# Patient Record
Sex: Male | Born: 1970
Health system: Southern US, Community
[De-identification: ages and names within clinical notes are randomized; demographics above are authoritative.]

## PROBLEM LIST (undated history)

## (undated) DIAGNOSIS — G4730 Sleep apnea, unspecified: Secondary | ICD-10-CM

## (undated) DIAGNOSIS — K219 Gastro-esophageal reflux disease without esophagitis: Secondary | ICD-10-CM

## (undated) DIAGNOSIS — T7840XA Allergy, unspecified, initial encounter: Secondary | ICD-10-CM

## (undated) DIAGNOSIS — K635 Polyp of colon: Secondary | ICD-10-CM

## (undated) DIAGNOSIS — K5792 Diverticulitis of intestine, part unspecified, without perforation or abscess without bleeding: Secondary | ICD-10-CM

## (undated) DIAGNOSIS — J329 Chronic sinusitis, unspecified: Secondary | ICD-10-CM

## (undated) DIAGNOSIS — M1990 Unspecified osteoarthritis, unspecified site: Secondary | ICD-10-CM

## (undated) DIAGNOSIS — F32A Depression, unspecified: Secondary | ICD-10-CM

## (undated) DIAGNOSIS — J309 Allergic rhinitis, unspecified: Secondary | ICD-10-CM

## (undated) HISTORY — PX: TRIGGER FINGER RELEASE: SHX641

## (undated) HISTORY — PX: SUBACROMIAL DECOMPRESSION: SHX5174

## (undated) HISTORY — PX: VASECTOMY: SHX75

## (undated) HISTORY — DX: Depression, unspecified: F32.A

## (undated) HISTORY — PX: NASAL SINUS SURGERY: SHX719

## (undated) HISTORY — DX: Polyp of colon: K63.5

## (undated) HISTORY — DX: Diverticulitis of intestine, part unspecified, without perforation or abscess without bleeding: K57.92

## (undated) HISTORY — DX: Allergy, unspecified, initial encounter: T78.40XA

## (undated) HISTORY — PX: KNEE ARTHROSCOPY: SHX127

## (undated) HISTORY — DX: Unspecified osteoarthritis, unspecified site: M19.90

## (undated) HISTORY — PX: CARPAL TUNNEL RELEASE: SHX101

---
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
## 2001-04-02 DIAGNOSIS — A630 Anogenital (venereal) warts: Secondary | ICD-10-CM | POA: Insufficient documentation

## 2016-12-29 HISTORY — PX: TRIGGER FINGER RELEASE: SHX641

## 2017-09-28 ENCOUNTER — Encounter: Payer: Self-pay | Admitting: Internal Medicine

## 2017-09-28 LAB — HM COLONOSCOPY

## 2017-12-29 HISTORY — PX: VASECTOMY: SHX75

## 2019-08-04 LAB — COMPREHENSIVE METABOLIC PANEL
Albumin: 4.2 (ref 3.5–5.0)
Calcium: 9.6 (ref 8.7–10.7)
GFR calc Af Amer: 97
GFR calc non Af Amer: 83

## 2019-08-04 LAB — LIPID PANEL
Cholesterol: 190 (ref 0–200)
HDL: 44 (ref 35–70)
LDL Cholesterol: 134
Triglycerides: 58 (ref 40–160)

## 2019-08-04 LAB — CBC AND DIFFERENTIAL
HCT: 42 (ref 41–53)
Hemoglobin: 13.2 — AB (ref 13.5–17.5)
Neutrophils Absolute: 1
Platelets: 173 (ref 150–399)
WBC: 3.4

## 2019-08-04 LAB — HEPATIC FUNCTION PANEL
ALT: 21 (ref 10–40)
AST: 21 (ref 14–40)
Alkaline Phosphatase: 72 (ref 25–125)
Bilirubin, Total: 0.9

## 2019-08-04 LAB — PSA: PSA: 0.56

## 2019-08-04 LAB — BASIC METABOLIC PANEL
BUN: 16 (ref 4–21)
CO2: 24 — AB (ref 13–22)
Chloride: 104 (ref 99–108)
Creatinine: 1.1 (ref 0.6–1.3)
Glucose: 78
Potassium: 4.9 (ref 3.4–5.3)
Sodium: 139 (ref 137–147)

## 2019-08-04 LAB — CBC: RBC: 4.33 (ref 3.87–5.11)

## 2020-06-02 DIAGNOSIS — J0141 Acute recurrent pansinusitis: Secondary | ICD-10-CM | POA: Diagnosis not present

## 2020-06-13 ENCOUNTER — Telehealth: Payer: Self-pay | Admitting: Internal Medicine

## 2020-06-13 DIAGNOSIS — L237 Allergic contact dermatitis due to plants, except food: Secondary | ICD-10-CM | POA: Diagnosis not present

## 2020-06-13 NOTE — Telephone Encounter (Signed)
Caller: Scott Phillips Call back phone number: [phone]  Patient is wondering if you would accepting him as a new patient.

## 2020-06-13 NOTE — Telephone Encounter (Signed)
Okay to schedule NP appt.  

## 2020-06-13 NOTE — Telephone Encounter (Signed)
Yes, that is okay.

## 2020-06-13 NOTE — Telephone Encounter (Signed)
Please advise 

## 2020-06-13 NOTE — Telephone Encounter (Signed)
Left message for the patient to call back to schedule appointment. Advise the patient to call his insurance company to assure we are in network since he has [facility] out of network

## 2020-06-18 DIAGNOSIS — J310 Chronic rhinitis: Secondary | ICD-10-CM | POA: Diagnosis not present

## 2020-06-18 DIAGNOSIS — J320 Chronic maxillary sinusitis: Secondary | ICD-10-CM | POA: Diagnosis not present

## 2020-06-19 ENCOUNTER — Other Ambulatory Visit: Payer: Self-pay | Admitting: Otolaryngology

## 2020-06-21 ENCOUNTER — Encounter (HOSPITAL_BASED_OUTPATIENT_CLINIC_OR_DEPARTMENT_OTHER): Payer: Self-pay | Admitting: Otolaryngology

## 2020-06-26 ENCOUNTER — Other Ambulatory Visit (HOSPITAL_COMMUNITY): Payer: BC Managed Care – PPO

## 2020-06-26 DIAGNOSIS — K219 Gastro-esophageal reflux disease without esophagitis: Secondary | ICD-10-CM | POA: Insufficient documentation

## 2020-06-26 DIAGNOSIS — G4733 Obstructive sleep apnea (adult) (pediatric): Secondary | ICD-10-CM | POA: Insufficient documentation

## 2020-06-28 ENCOUNTER — Other Ambulatory Visit (HOSPITAL_COMMUNITY)
Admission: RE | Admit: 2020-06-28 | Discharge: 2020-06-28 | Disposition: A | Discharge status: Home / Self Care | Payer: BC Managed Care – PPO | Source: Ambulatory Visit | Attending: Otolaryngology | Admitting: Otolaryngology

## 2020-06-28 DIAGNOSIS — Z20822 Contact with and (suspected) exposure to covid-19: Secondary | ICD-10-CM | POA: Diagnosis not present

## 2020-06-28 DIAGNOSIS — Z01812 Encounter for preprocedural laboratory examination: Secondary | ICD-10-CM | POA: Insufficient documentation

## 2020-06-28 LAB — SARS CORONAVIRUS 2 (TAT 6-24 HRS): SARS Coronavirus 2: NEGATIVE

## 2020-06-29 ENCOUNTER — Ambulatory Visit (HOSPITAL_BASED_OUTPATIENT_CLINIC_OR_DEPARTMENT_OTHER): Payer: BC Managed Care – PPO | Admitting: Anesthesiology

## 2020-06-29 ENCOUNTER — Ambulatory Visit (HOSPITAL_BASED_OUTPATIENT_CLINIC_OR_DEPARTMENT_OTHER)
Admission: RE | Admit: 2020-06-29 | Discharge: 2020-06-29 | Disposition: A | Discharge status: Home / Self Care | Payer: BC Managed Care – PPO | Attending: Otolaryngology | Admitting: Otolaryngology

## 2020-06-29 ENCOUNTER — Encounter (HOSPITAL_BASED_OUTPATIENT_CLINIC_OR_DEPARTMENT_OTHER): Payer: Self-pay | Admitting: Otolaryngology

## 2020-06-29 ENCOUNTER — Other Ambulatory Visit: Payer: Self-pay

## 2020-06-29 ENCOUNTER — Encounter (HOSPITAL_BASED_OUTPATIENT_CLINIC_OR_DEPARTMENT_OTHER)
Admission: RE | Disposition: A | Discharge status: Home / Self Care | Payer: Self-pay | Source: Home / Self Care | Attending: Otolaryngology

## 2020-06-29 DIAGNOSIS — J320 Chronic maxillary sinusitis: Secondary | ICD-10-CM | POA: Diagnosis not present

## 2020-06-29 DIAGNOSIS — G4730 Sleep apnea, unspecified: Secondary | ICD-10-CM | POA: Insufficient documentation

## 2020-06-29 DIAGNOSIS — J338 Other polyp of sinus: Secondary | ICD-10-CM | POA: Diagnosis not present

## 2020-06-29 DIAGNOSIS — B49 Unspecified mycosis: Secondary | ICD-10-CM | POA: Insufficient documentation

## 2020-06-29 DIAGNOSIS — G4733 Obstructive sleep apnea (adult) (pediatric): Secondary | ICD-10-CM | POA: Diagnosis not present

## 2020-06-29 DIAGNOSIS — K219 Gastro-esophageal reflux disease without esophagitis: Secondary | ICD-10-CM | POA: Diagnosis not present

## 2020-06-29 HISTORY — DX: Allergic rhinitis, unspecified: J30.9

## 2020-06-29 HISTORY — DX: Sleep apnea, unspecified: G47.30

## 2020-06-29 HISTORY — DX: Chronic sinusitis, unspecified: J32.9

## 2020-06-29 HISTORY — PX: MAXILLARY ANTROSTOMY: SHX2003

## 2020-06-29 HISTORY — DX: Gastro-esophageal reflux disease without esophagitis: K21.9

## 2020-06-29 SURGERY — MAXILLARY ANTROSTOMY
Anesthesia: General | Site: Nose | Laterality: Right

## 2020-06-29 MED ORDER — FENTANYL CITRATE (PF) 250 MCG/5ML IJ SOLN
INTRAMUSCULAR | Status: DC | PRN
  Administered 2020-06-29 (×2): 50 ug via INTRAVENOUS

## 2020-06-29 MED ORDER — CEFAZOLIN SODIUM-DEXTROSE 2-3 GM-%(50ML) IV SOLR
INTRAVENOUS | Status: DC | PRN
  Administered 2020-06-29: 2 g via INTRAVENOUS

## 2020-06-29 MED ORDER — AMOXICILLIN 875 MG PO TABS: 875.0000 mg | ORAL_TABLET | Freq: Two times a day (BID) | ORAL | 0 refills | Status: AC

## 2020-06-29 MED ORDER — CEFAZOLIN SODIUM 1 G IJ SOLR
INTRAMUSCULAR | Status: AC
  Filled 2020-06-29: qty 20

## 2020-06-29 MED ORDER — FENTANYL CITRATE (PF) 100 MCG/2ML IJ SOLN
INTRAMUSCULAR | Status: AC
  Filled 2020-06-29: qty 2

## 2020-06-29 MED ORDER — LACTATED RINGERS IV SOLN: INTRAVENOUS | Status: DC

## 2020-06-29 MED ORDER — CIPROFLOXACIN-FLUOCINOLONE PF 0.3-0.025 % OT SOLN
OTIC | Status: AC
  Filled 2020-06-29: qty 1

## 2020-06-29 MED ORDER — ONDANSETRON HCL 4 MG/2ML IJ SOLN
INTRAMUSCULAR | Status: DC | PRN
  Administered 2020-06-29: 4 mg via INTRAVENOUS

## 2020-06-29 MED ORDER — SUGAMMADEX SODIUM 200 MG/2ML IV SOLN
INTRAVENOUS | Status: DC | PRN
  Administered 2020-06-29: 200 mg via INTRAVENOUS

## 2020-06-29 MED ORDER — MIDAZOLAM HCL 5 MG/5ML IJ SOLN
INTRAMUSCULAR | Status: DC | PRN
  Administered 2020-06-29: 2 mg via INTRAVENOUS

## 2020-06-29 MED ORDER — CIPROFLOXACIN-DEXAMETHASONE 0.3-0.1 % OT SUSP
OTIC | Status: AC
  Filled 2020-06-29: qty 7.5

## 2020-06-29 MED ORDER — DEXAMETHASONE SODIUM PHOSPHATE 10 MG/ML IJ SOLN
INTRAMUSCULAR | Status: DC | PRN
Start: 2020-06-29 — End: 2020-06-29
  Administered 2020-06-29: 5 mg via INTRAVENOUS

## 2020-06-29 MED ORDER — LIDOCAINE-EPINEPHRINE 1 %-1:100000 IJ SOLN
INTRAMUSCULAR | Status: AC
  Filled 2020-06-29: qty 2

## 2020-06-29 MED ORDER — MUPIROCIN 2 % EX OINT
TOPICAL_OINTMENT | CUTANEOUS | Status: AC
  Filled 2020-06-29: qty 44

## 2020-06-29 MED ORDER — HYDROMORPHONE HCL 1 MG/ML IJ SOLN
INTRAMUSCULAR | Status: AC
  Filled 2020-06-29: qty 0.5

## 2020-06-29 MED ORDER — ONDANSETRON HCL 4 MG/2ML IJ SOLN
INTRAMUSCULAR | Status: AC
  Filled 2020-06-29: qty 2

## 2020-06-29 MED ORDER — OXYMETAZOLINE HCL 0.05 % NA SOLN
NASAL | Status: AC
  Filled 2020-06-29: qty 60

## 2020-06-29 MED ORDER — DEXAMETHASONE SODIUM PHOSPHATE 10 MG/ML IJ SOLN
INTRAMUSCULAR | Status: AC
  Filled 2020-06-29: qty 1

## 2020-06-29 MED ORDER — HYDROMORPHONE HCL 1 MG/ML IJ SOLN
0.2500 mg | INTRAMUSCULAR | Status: DC | PRN
  Administered 2020-06-29: 0.5 mg via INTRAVENOUS

## 2020-06-29 MED ORDER — MIDAZOLAM HCL 2 MG/2ML IJ SOLN
INTRAMUSCULAR | Status: AC
  Filled 2020-06-29: qty 2

## 2020-06-29 MED ORDER — PROPOFOL 10 MG/ML IV BOLUS
INTRAVENOUS | Status: DC | PRN
  Administered 2020-06-29: 200 mg via INTRAVENOUS

## 2020-06-29 MED ORDER — OXYMETAZOLINE HCL 0.05 % NA SOLN
NASAL | Status: DC | PRN
  Administered 2020-06-29: 1 via TOPICAL

## 2020-06-29 MED ORDER — ROCURONIUM BROMIDE 10 MG/ML (PF) SYRINGE
PREFILLED_SYRINGE | INTRAVENOUS | Status: AC
  Filled 2020-06-29: qty 10

## 2020-06-29 MED ORDER — LIDOCAINE 2% (20 MG/ML) 5 ML SYRINGE
INTRAMUSCULAR | Status: DC | PRN
  Administered 2020-06-29: 100 mg via INTRAVENOUS

## 2020-06-29 MED ORDER — ROCURONIUM BROMIDE 10 MG/ML (PF) SYRINGE
PREFILLED_SYRINGE | INTRAVENOUS | Status: DC | PRN
  Administered 2020-06-29: 60 mg via INTRAVENOUS

## 2020-06-29 MED ORDER — ACETAMINOPHEN 10 MG/ML IV SOLN: 1000.0000 mg | Freq: Once | INTRAVENOUS | Status: DC | PRN

## 2020-06-29 MED ORDER — PROPOFOL 10 MG/ML IV BOLUS
INTRAVENOUS | Status: AC
  Filled 2020-06-29: qty 20

## 2020-06-29 MED ORDER — LIDOCAINE 2% (20 MG/ML) 5 ML SYRINGE
INTRAMUSCULAR | Status: AC
  Filled 2020-06-29: qty 5

## 2020-06-29 SURGICAL SUPPLY — 36 items
BLADE RAD40 ROTATE 4M 4 5PK (BLADE) IMPLANT
BLADE RAD60 ROTATE M4 4 5PK (BLADE) IMPLANT
BLADE TRICUT ROTATE M4 4 5PK (BLADE) IMPLANT
CANISTER SUC SOCK COL 7IN (MISCELLANEOUS) ×2 IMPLANT
CANISTER SUCT 1200ML W/VALVE (MISCELLANEOUS) ×2 IMPLANT
COAGULATOR SUCT 8FR VV (MISCELLANEOUS) ×2 IMPLANT
CONT SPEC 4OZ CLIKSEAL STRL BL (MISCELLANEOUS) ×2 IMPLANT
COVER WAND RF STERILE (DRAPES) IMPLANT
DECANTER SPIKE VIAL GLASS SM (MISCELLANEOUS) IMPLANT
DRSG NASOPORE 8CM (GAUZE/BANDAGES/DRESSINGS) ×2 IMPLANT
ELECT REM PT RETURN 9FT ADLT (ELECTROSURGICAL) ×2
ELECTRODE REM PT RTRN 9FT ADLT (ELECTROSURGICAL) ×1 IMPLANT
GLOVE BIO SURGEON STRL SZ 6.5 (GLOVE) ×6 IMPLANT
GLOVE BIO SURGEON STRL SZ7.5 (GLOVE) ×2 IMPLANT
GLOVE BIOGEL PI IND STRL 7.0 (GLOVE) ×3 IMPLANT
GLOVE BIOGEL PI INDICATOR 7.0 (GLOVE) ×3
GOWN STRL REUS W/ TWL LRG LVL3 (GOWN DISPOSABLE) ×3 IMPLANT
GOWN STRL REUS W/TWL LRG LVL3 (GOWN DISPOSABLE) ×3
HEMOSTAT SURGICEL 2X14 (HEMOSTASIS) IMPLANT
IV NS 500ML (IV SOLUTION) ×1
IV NS 500ML BAXH (IV SOLUTION) ×1 IMPLANT
NEEDLE HYPO 25X1 1.5 SAFETY (NEEDLE) IMPLANT
NS IRRIG 1000ML POUR BTL (IV SOLUTION) IMPLANT
PACK BASIN DAY SURGERY FS (CUSTOM PROCEDURE TRAY) ×2 IMPLANT
PACK ENT DAY SURGERY (CUSTOM PROCEDURE TRAY) ×2 IMPLANT
SLEEVE SCD COMPRESS KNEE MED (MISCELLANEOUS) ×2 IMPLANT
SOLUTION BUTLER CLEAR DIP (MISCELLANEOUS) ×2 IMPLANT
SPONGE GAUZE 2X2 8PLY STRL LF (GAUZE/BANDAGES/DRESSINGS) ×2 IMPLANT
SPONGE NEURO XRAY DETECT 1X3 (DISPOSABLE) ×2 IMPLANT
SUCTION FRAZIER HANDLE 10FR (MISCELLANEOUS) ×1
SUCTION TUBE FRAZIER 10FR DISP (MISCELLANEOUS) ×1 IMPLANT
SYR 50ML LL SCALE MARK (SYRINGE) ×2 IMPLANT
TOWEL GREEN STERILE FF (TOWEL DISPOSABLE) ×2 IMPLANT
TUBE CONNECTING 20X1/4 (TUBING) ×2 IMPLANT
TUBE SALEM SUMP 16 FR W/ARV (TUBING) ×2 IMPLANT
YANKAUER SUCT BULB TIP NO VENT (SUCTIONS) ×2 IMPLANT

## 2020-06-29 NOTE — Anesthesia Postprocedure Evaluation (Signed)
Anesthesia Post Note  Patient: Scott Phillips  Procedure(s) Performed: RIGHT MAXILLARY ANTROSTOMY WITH TISSUE REMOVAL (Right Nose)     Patient location during evaluation: PACU Anesthesia Type: General Level of consciousness: awake and alert Pain management: pain level controlled Vital Signs Assessment: post-procedure vital signs reviewed and stable Respiratory status: spontaneous breathing, nonlabored ventilation, respiratory function stable and patient connected to nasal cannula oxygen Cardiovascular status: blood pressure returned to baseline and stable Postop Assessment: no apparent nausea or vomiting Anesthetic complications: no   No complications documented.  Last Vitals:  Vitals:   06/29/20 1027 06/29/20 1055  BP: 125/75 118/77  Pulse: 72 77  Resp: 13 18  Temp:  36.9 C  SpO2: 98% 97%    Last Pain:  Vitals:   06/29/20 1055  TempSrc:   PainSc: 2                  Manette Doto S

## 2020-06-29 NOTE — Discharge Instructions (Signed)
Post Anesthesia Home Care Instructions  Activity: Get plenty of rest for the remainder of the day. A responsible individual must stay with you for 24 hours following the procedure.  For the next 24 hours, DO NOT: -Drive a car -Paediatric nurse -Drink alcoholic beverages -Take any medication unless instructed by your physician -Make any legal decisions or sign important papers.  Meals: Start with liquid foods such as gelatin or soup. Progress to regular foods as tolerated. Avoid greasy, spicy, heavy foods. If nausea and/or vomiting occur, drink only clear liquids until the nausea and/or vomiting subsides. Call your physician if vomiting continues.  Special Instructions/Symptoms: Your throat may feel dry or sore from the anesthesia or the breathing tube placed in your throat during surgery. If this causes discomfort, gargle with warm salt water. The discomfort should disappear within 24 hours.  If you had a scopolamine patch placed behind your ear for the management of post- operative nausea and/or vomiting:  1. The medication in the patch is effective for 72 hours, after which it should be removed.  Wrap patch in a tissue and discard in the trash. Wash hands thoroughly with soap and water. 2. You may remove the patch earlier than 72 hours if you experience unpleasant side effects which may include dry mouth, dizziness or visual disturbances. 3. Avoid touching the patch. Wash your hands with soap and water after contact with the patch.     Call your surgeon if you experience:   1.  Fever over 101.0. 2.  Inability to urinate. 3.  Nausea and/or vomiting. 4.  Extreme swelling or bruising at the surgical site. 5.  Continued bleeding from the incision. 6.  Increased pain, redness or drainage from the incision. 7.  Problems related to your pain medication. 8.  Any problems and/or concerns ----------------  POSTOPERATIVE INSTRUCTIONS FOR PATIENTS HAVING NASAL OR SINUS  OPERATIONS ACTIVITY: Restrict activity at home for the first two days, resting as much as possible. Light activity is best. You may usually return to work within a week. You should refrain from nose blowing, strenuous activity, or heavy lifting greater than 20lbs for a total of one week after your operation.  If sneezing cannot be avoided, sneeze with your mouth open. DISCOMFORT: You may experience a dull headache and pressure along with nasal congestion and discharge. These symptoms may be worse during the first week after the operation but may last as long as two to four weeks.  Please take Tylenol or the pain medication that has been prescribed for you. Do not take aspirin or aspirin containing medications since they may cause bleeding.  You may experience symptoms of post nasal drainage, nasal congestion, headaches and fatigue for two or three months after your operation.  BLEEDING: You may have some blood tinged nasal drainage for approximately two weeks after the operation.  The discharge will be worse for the first week.  Please call our office at [phone] or go to the nearest hospital emergency room if you experience any of the following: heavy, bright red blood from your nose or mouth that lasts longer than 15 minutes or coughing up or vomiting bright red blood or blood clots. GENERAL CONSIDERATIONS: 1. A gauze dressing will be placed on your upper lip to absorb any drainage after the operation. You may need to change this several times a day.  If you do not have very much drainage, you may remove the dressing.  Remember that you may gently wipe your nose with a  tissue and sniff in, but DO NOT blow your nose. 2. Please keep all of your postoperative appointments.  Your final results after the operation will depend on proper follow-up.  The initial visit is usually 2 to 5 days after the operation.  During this visit, the remaining nasal packing and internal septal splints will be removed.  Your  nasal and sinus cavities will be cleaned.  During the second visit, your nasal and sinus cavities will be cleaned again. Have someone drive you to your first two postoperative appointments.  3. How you care for your nose after the operation will influence the results that you obtain.  You should follow all directions, take your medication as prescribed, and call our office [phone] with any problems or questions. 4. You may be more comfortable sleeping with your head elevated on two pillows. 5. Do not take any medications that we have not prescribed or recommended. WARNING SIGNS: if any of the following should occur, please call our office: 1. Persistent fever greater than 102F. 2. Persistent vomiting. 3. Severe and constant pain that is not relieved by prescribed pain medication. 4. Trauma to the nose. 5. Rash or unusual side effects from any medicines.

## 2020-06-29 NOTE — Anesthesia Preprocedure Evaluation (Signed)
Anesthesia Evaluation  Patient identified by MRN, date of birth, ID band Patient awake    Reviewed: Allergy & Precautions, NPO status , Patient's Chart, lab work & pertinent test results  Airway Mallampati: II  TM Distance: >3 FB Neck ROM: Full    Dental no notable dental hx.    Pulmonary sleep apnea ,    Pulmonary exam normal breath sounds clear to auscultation       Cardiovascular negative cardio ROS Normal cardiovascular exam Rhythm:Regular Rate:Normal     Neuro/Psych negative neurological ROS  negative psych ROS   GI/Hepatic Neg liver ROS, GERD  ,  Endo/Other  negative endocrine ROS  Renal/GU negative Renal ROS  negative genitourinary   Musculoskeletal negative musculoskeletal ROS (+)   Abdominal   Peds negative pediatric ROS (+)  Hematology negative hematology ROS (+)   Anesthesia Other Findings   Reproductive/Obstetrics negative OB ROS                             Anesthesia Physical Anesthesia Plan  ASA: II  Anesthesia Plan: General   Post-op Pain Management:    Induction: Intravenous  PONV Risk Score and Plan: 2 and Ondansetron, Dexamethasone and Treatment may vary due to age or medical condition  Airway Management Planned: Oral ETT  Additional Equipment:   Intra-op Plan:   Post-operative Plan: Extubation in OR  Informed Consent: I have reviewed the patients History and Physical, chart, labs and discussed the procedure including the risks, benefits and alternatives for the proposed anesthesia with the patient or authorized representative who has indicated his/her understanding and acceptance.     Dental advisory given  Plan Discussed with: CRNA and Surgeon  Anesthesia Plan Comments:         Anesthesia Quick Evaluation

## 2020-06-29 NOTE — Transfer of Care (Signed)
Immediate Anesthesia Transfer of Care Note  Patient: Scott Phillips  Procedure(s) Performed: RIGHT MAXILLARY ANTROSTOMY WITH TISSUE REMOVAL (Right Nose)  Patient Location: PACU  Anesthesia Type:General  Level of Consciousness: sedated and responds to stimulation  Airway & Oxygen Therapy: Patient Spontanous Breathing and Patient connected to face mask oxygen  Post-op Assessment: Report given to RN, Post -op Vital signs reviewed and stable and Patient moving all extremities  Post vital signs: Reviewed and stable  Last Vitals:  Vitals Value Taken Time  BP 129/81 06/29/20 0930  Temp    Pulse 81 06/29/20 0931  Resp 15 06/29/20 0931  SpO2 89 % 06/29/20 0931  Vitals shown include unvalidated device data.  Last Pain:  Vitals:   06/29/20 0750  TempSrc: Oral  PainSc: 0-No pain         Complications: No complications documented.

## 2020-06-29 NOTE — Anesthesia Procedure Notes (Signed)
Procedure Name: Intubation Date/Time: 06/29/2020 8:36 AM Performed by: Myna Bright, CRNA Pre-anesthesia Checklist: Patient identified, Emergency Drugs available, Suction available and Patient being monitored Patient Re-evaluated:Patient Re-evaluated prior to induction Oxygen Delivery Method: Circle system utilized Preoxygenation: Pre-oxygenation with 100% oxygen Induction Type: IV induction Ventilation: Mask ventilation without difficulty Laryngoscope Size: Mac and 4 Grade View: Grade II Tube type: Oral Tube size: 8.0 mm Number of attempts: 1 Airway Equipment and Method: Stylet Placement Confirmation: ETT inserted through vocal cords under direct vision,  positive ETCO2 and breath sounds checked- equal and bilateral Secured at: 23 cm Tube secured with: Tape Dental Injury: Teeth and Oropharynx as per pre-operative assessment

## 2020-06-29 NOTE — Op Note (Signed)
DATE OF PROCEDURE: 06/29/2020  OPERATIVE REPORT   SURGEON: Leta Baptist, MD   PREOPERATIVE DIAGNOSES:  1. Chronic right maxillary sinusitis 2. Right maxillary fungus ball   POSTOPERATIVE DIAGNOSES:  1. Chronic right maxillary sinusitis 2. Right maxillary fungus ball   PROCEDURE PERFORMED:  1. Right endoscopic maxillary antrostomy and tissue removal  ANESTHESIA: General endotracheal tube anesthesia.   COMPLICATIONS: None.   ESTIMATED BLOOD LOSS: 50 mL.   INDICATION FOR PROCEDURE: Scott Phillips is a 49 y.o. male with a history of chronic right maxillary sinus. The patient noted constant right sided facial pain and pressure. He has been on several courses of antibiotics and prednisone with only short term relief. The patient was having recurrent sore throat and lymphadenopathy which was felt to be related to his chronic sinus issues. His CT scan showed complete opacification of the right maxillary sinus, with features suggestive of fungus ball. Based on the above findings, the decision was made for the patient to undergo the above-stated procedures. The risks, benefits, alternatives, and details of the procedures were discussed with the patient. Questions were invited and answered. Informed consent was obtained.   DESCRIPTION OF PROCEDURE: The patient was taken to the operating room and placed supine on the operating table. General endotracheal tube anesthesia was administered by the anesthesiologist. The patient was positioned, and prepped and draped in the standard fashion for nasal surgery. Pledgets soaked with Afrin were placed in both nasal cavities for decongestion. The pledgets were subsequently removed.   Using a 0 endoscope, the right nasal cavity was examined. A large middle turbinate was noted. Using Tru-Cut forceps, the inferior one third of the middle turbinate was resected. Polypoid tissue was noted within the middle meatus. The polypoid tissue was removed using a combination of  microdebrider and Blakesley forceps. The uncinate process was resected with a freer elevator. The maxillary antrum was entered and enlarged using a combination of backbiter and microdebrider. A large amount of fungus ball was removed from the right maxillary sinus. The maxillary sinus was copiously irrigated with saline solution.  The care of the patient was turned over to the anesthesiologist. The patient was awakened from anesthesia without difficulty. The patient was extubated and transferred to the recovery room in good condition.   OPERATIVE FINDINGS: Right chronic maxillary sinuses with fungus ball.  SPECIMEN: Right sinus content.  FOLLOWUP CARE: The patient be discharged home once he is awake and alert. The patient will be placed on amoxicillin 875 mg p.o. b.i.d. for 3 days. The patient will follow up in my office in 1 week.  Tinsley Lomas Raynelle Bring, MD

## 2020-06-29 NOTE — H&P (Signed)
ZO:XWRUEAV sinus infections  HPI: The patient is a 49 y/o male who presents today for evaluation of chronic sinus infections. The patient underwent sinus surgery 10 + years ago with improvement in his symptoms until the past 6 months. The patient recently moved from [place]. He was seeing an ENT there with recent CT showed opacification of his right maxillary sinus. The patient notes constant right sided facial pain and pressure. He has been on several courses of antibiotics and prednisone with only short term relief. The patient was having recurrent sore throat and lymphadenopathy which was felt to be related to his chronic sinus issues. The patient is not currently on a steroid nasal spray. He has a history of sleep apnea and is currently using a CPAP.  The patient's review of systems (constitutional, eyes, ENT, cardiovascular, respiratory, GI, musculoskeletal, skin, neurologic, psychiatric, endocrine, hematologic, allergic) is noted in the ROS questionnaire.  It is reviewed with the patient.   Family health history: Diabetes.  Major events: Ballon sinoplasty.  Ongoing medical problems: Reflux, arthritis, depression, allergies.  Social history: The patient is married. He denies the use of tobacco or illegal drugs. He drinks alcohol on a social basis.   Exam: General: Communicates without difficulty, well nourished, no acute distress. Head: Normocephalic, no evidence injury, no tenderness, facial buttresses intact without stepoff. Eyes: PERRL, EOMI. No scleral icterus, conjunctivae clear. Neuro: CN II exam reveals vision grossly intact.  No nystagmus at any point of gaze. Ears: Auricles well formed without lesions.  Ear canals are intact without mass or lesion.  No erythema or edema is appreciated.  The TMs are intact without fluid. Nose: External evaluation reveals normal support and skin without lesions.  Dorsum is intact.  Anterior rhinoscopy reveals congested and edematous mucosa over  anterior aspect of the inferior turbinates and nasal septum.  No purulence is noted. Middle meatus is not well visualized. Oral:  Oral cavity and oropharynx are intact, symmetric, without erythema or edema.  Mucosa is moist without lesions. Neck: Full range of motion without pain.  There is no significant lymphadenopathy.  No masses palpable.  Thyroid bed within normal limits to palpation.  Parotid glands and submandibular glands equal bilaterally without mass.  Trachea is midline. Neuro:  CN 2-12 grossly intact. Gait normal. Vestibular: No nystagmus at any point of gaze.   Procedure: Flexible Nasal Endoscopy Description: Risks, benefits, and alternatives of flexible endoscopy were explained to the patient. Specific mention was made of the risk of throat numbness with difficulty swallowing, possible bleeding from the nose and mouth, and pain from the procedure. The patient gave oral consent to proceed.  The flexible scope was inserted into the right nasal cavity. Endoscopy of the interior nasal cavity, superior, inferior, and middle meatus was performed. The sphenoid-ethmoid recess was examined. Edematous mucosa was noted. Mucopurulent drainage from right middle meatus. No polyp, mass, or lesion was appreciated. Olfactory cleft was clear. Nasopharynx was clear. Turbinates were hypertrophied but without mass. Incomplete response to decongestion. The procedure was repeated on the contralateral side with similar findings. The patient tolerated the procedure well.   Assessment 1. Mucopurulent drainage is noted from the right middle meatus, consistent with CT findings of acute/ chronic right maxillary sinusitis. No polyps, or other suspicious mass or lesion is noted on today's nasal endoscopy. 2. CT shows complete opacification of the right maxillary sinus with findings suggestive of a fungal ball.   Plan 1. The physical exam, nasal endoscopy, and CT findings are reviewed with  the patient.  2. The patient will  benefit from right maxillary antrostomy. The risks, benefits, alternatives, and details of the procedure are reviewed with the patient. Questions are invited and answered. 3. The patient is interested in proceeding with the procedure.  We will schedule the procedure in accordance with the patient's schedule.

## 2020-07-03 ENCOUNTER — Other Ambulatory Visit: Payer: Self-pay

## 2020-07-03 ENCOUNTER — Encounter (HOSPITAL_BASED_OUTPATIENT_CLINIC_OR_DEPARTMENT_OTHER): Payer: Self-pay | Admitting: Otolaryngology

## 2020-07-04 LAB — SURGICAL PATHOLOGY

## 2020-07-05 DIAGNOSIS — J320 Chronic maxillary sinusitis: Secondary | ICD-10-CM | POA: Diagnosis not present

## 2020-07-05 DIAGNOSIS — J338 Other polyp of sinus: Secondary | ICD-10-CM | POA: Diagnosis not present

## 2020-07-10 ENCOUNTER — Encounter: Payer: Self-pay | Admitting: Internal Medicine

## 2020-07-10 ENCOUNTER — Telehealth: Payer: Self-pay

## 2020-07-10 ENCOUNTER — Other Ambulatory Visit: Payer: Self-pay

## 2020-07-10 ENCOUNTER — Ambulatory Visit (INDEPENDENT_AMBULATORY_CARE_PROVIDER_SITE_OTHER): Payer: BC Managed Care – PPO | Admitting: Internal Medicine

## 2020-07-10 VITALS — BP 119/84 | HR 70 | Temp 98.3°F | Resp 18 | Ht 68.0 in | Wt 238.0 lb

## 2020-07-10 DIAGNOSIS — D367 Benign neoplasm of other specified sites: Secondary | ICD-10-CM

## 2020-07-10 DIAGNOSIS — K219 Gastro-esophageal reflux disease without esophagitis: Secondary | ICD-10-CM | POA: Diagnosis not present

## 2020-07-10 DIAGNOSIS — K635 Polyp of colon: Secondary | ICD-10-CM

## 2020-07-10 DIAGNOSIS — G4733 Obstructive sleep apnea (adult) (pediatric): Secondary | ICD-10-CM

## 2020-07-10 MED ORDER — PANTOPRAZOLE SODIUM 40 MG PO TBEC: 40.0000 mg | DELAYED_RELEASE_TABLET | Freq: Two times a day (BID) | ORAL | 3 refills | Status: DC

## 2020-07-10 MED ORDER — FAMOTIDINE 20 MG PO TABS: 20.0000 mg | ORAL_TABLET | Freq: Two times a day (BID) | ORAL | 1 refills | Status: DC

## 2020-07-10 NOTE — Progress Notes (Signed)
Pre visit review using our clinic review tool, if applicable. No additional management support is needed unless otherwise documented below in the visit note. 

## 2020-07-10 NOTE — Telephone Encounter (Signed)
ROI faxed to [facility] at [phone]. ROI sent for scanning. Awaiting medical records.

## 2020-07-10 NOTE — Progress Notes (Signed)
Subjective:    Patient ID: Scott Phillips, male    DOB: 06/06/1971, 49 y.o.   MRN: [id number]  DOS:  07/10/2020 Type of visit - description: New patient In general feeling well. We review all his past medical history.    Review of Systems Currently denies heartburn, dysphagia. + Stress due to recent move. Good compliance with CPAP, energy level is satisfactory.   Past Medical History:  Diagnosis Date  . Allergic rhinosinusitis   . Allergy   . Arthritis   . Colon polyps   . Depression   . Diverticulitis   . GERD (gastroesophageal reflux disease)   . Sinusitis   . Sleep apnea     Past Surgical History:  Procedure Laterality Date  . KNEE ARTHROSCOPY Right 1991   cyst removal  . MAXILLARY ANTROSTOMY Right 06/29/2020   Procedure: RIGHT MAXILLARY ANTROSTOMY WITH TISSUE REMOVAL;  Surgeon: Leta Baptist, MD;  Location: [facility];  Service: ENT;  Laterality: Right;  . NASAL SINUS SURGERY     x3 in [place]  . SUBACROMIAL DECOMPRESSION Left   . TRIGGER FINGER RELEASE Right 2018   R 3rd  . VASECTOMY  2019    Allergies as of 07/10/2020      Reactions   Milk-related Compounds       Medication List       Accurate as of July 10, 2020 11:59 PM. If you have any questions, ask your nurse or doctor.        b complex vitamins tablet Take 1 tablet by mouth 3 (three) times a week.   CALCIUM-MAGNESUIUM-ZINC PO Take by mouth 2 (two) times daily.   cetirizine 10 MG tablet Commonly known as: ZYRTEC Take 10 mg by mouth daily.   famotidine 20 MG tablet Commonly known as: PEPCID Take 1 tablet (20 mg total) by mouth 2 (two) times daily before a meal. What changed: when to take this Changed by: [name], MD   FIBER CHOICE PO Take by mouth 5 (five) times daily.   fluticasone 50 MCG/ACT nasal spray Commonly known as: FLONASE Place 2 sprays into both nostrils daily.   MULTIVITAMIN ADULT PO Take by mouth.   pantoprazole 40 MG tablet Commonly known as:  PROTONIX Take 1 tablet (40 mg total) by mouth 2 (two) times daily before a meal. What changed: when to take this Changed by: [name], MD   [place] by mouth.   Turmeric Curcumin Caps Take 1,000 each by mouth.          Objective:   Physical Exam BP 119/84 (BP Location: Left Arm, Patient Position: Sitting, Cuff Size: Normal)   Pulse 70   Temp 98.3 F (36.8 C) (Oral)   Resp 18   Ht 5\' 8"  (1.727 m)   Wt 238 lb (108 kg)   SpO2 98%   BMI 36.19 kg/m  General:   Well developed, NAD, BMI noted. HEENT:  Normocephalic . Face symmetric, atraumatic Lungs:  CTA B Normal respiratory effort, no intercostal retractions, no accessory muscle use. Heart: RRR,  no murmur.  Lower extremities: no pretibial edema bilaterally  Skin: Not pale. Not jaundice Neurologic:  alert & oriented X3.  Speech normal, gait appropriate for age and unassisted Psych--  Cognition and judgment appear intact.  Cooperative with normal attention span and concentration.  Behavior appropriate. No anxious or depressed appearing.      Assessment    New patient 7-20 10, moved from [place]. OSA ,  dx ~2010, +CPAP GERD Colon Polyps H/o Diverticulitis  H/o Depression Allergies  Sinus issues: Surgery 2012, surgery 06/2020  PLAN OSA: Good compliance with CPAP History of depression: + Stress due to moving to this area from [place], in the past psychotherapy and occasional medications help.  At this point he is doing well but he is advised to reach out if help is needed. GERD: Refill medications, symptoms controlled. Per old records: EGD 2008 normal EGD 09/03/2018: Mild erythema, BX: No H. pylori, no significant pathology Colon polyps:  Colonoscopy report to be scanned in.  C-scope done 09/28/2017, according to report procedure done because LLQ abdominal pain and abnormal GI x-ray.  Findings: Cecum polyp, diverticulosis, BX: Sessile serrated polyp.  Next in 5 years per  literature. History of allergies: Request an allergist referral, will arrange, he will provide the name Cysts, skin: Request a dermatology referral will do. Sinusitis: Had extensive sinus surgery 2 weeks ago, recovering well.  See pathology report.  + Aspergillus. Social: Recently moved from [place] to [place] to stay closer to family, working remotely. RTC CPX 3 months (call ahead   if he likes labs before the visit)    This visit occurred during the SARS-CoV-2 public health emergency.  Safety protocols were in place, including screening questions prior to the visit, additional usage of staff PPE, and extensive cleaning of exam room while observing appropriate contact time as indicated for disinfecting solutions.

## 2020-07-10 NOTE — Patient Instructions (Addendum)
We will refer you to a dermatologist  We will refer you to an allergist, please let us know who you like to see  Please reach out if you have questions   GO TO [place] back for a physical exam in 3 months.

## 2020-07-10 NOTE — Telephone Encounter (Signed)
ROI faxed to [facility] at [phone]. ROI sent for scanning. Awaiting medical records.

## 2020-07-11 NOTE — Telephone Encounter (Signed)
Received medical records. Placed in PCP yellow folder for review.

## 2020-07-13 ENCOUNTER — Encounter: Payer: Self-pay | Admitting: Internal Medicine

## 2020-07-13 DIAGNOSIS — Z09 Encounter for follow-up examination after completed treatment for conditions other than malignant neoplasm: Secondary | ICD-10-CM | POA: Insufficient documentation

## 2020-07-13 DIAGNOSIS — K635 Polyp of colon: Secondary | ICD-10-CM | POA: Insufficient documentation

## 2020-07-13 NOTE — Assessment & Plan Note (Signed)
OSA: Good compliance with CPAP History of depression: + Stress due to moving to this area from [place], in the past psychotherapy and occasional medications help.  At this point he is doing well but he is advised to reach out if help is needed. GERD: Refill medications, symptoms controlled. Per old records: EGD 2008 normal EGD 09/03/2018: Mild erythema, BX: No H. pylori, no significant pathology Colon polyps:  Colonoscopy report to be scanned in.  C-scope done 09/28/2017, according to report procedure done because LLQ abdominal pain and abnormal GI x-ray.  Findings: Cecum polyp, diverticulosis, BX: Sessile serrated polyp.  Next in 5 years per literature. History of allergies: Request an allergist referral, will arrange, he will provide the name Cysts, skin: Request a dermatology referral will do. Sinusitis: Had extensive sinus surgery 2 weeks ago, recovering well.  See pathology report.  + Aspergillus. Social: Recently moved from [place] to [place] to stay closer to family, working remotely. RTC CPX 3 months (call ahead   if he likes labs before the visit)

## 2020-07-13 NOTE — Telephone Encounter (Signed)
Records reviewed.

## 2020-07-27 DIAGNOSIS — J338 Other polyp of sinus: Secondary | ICD-10-CM | POA: Diagnosis not present

## 2020-07-27 DIAGNOSIS — J320 Chronic maxillary sinusitis: Secondary | ICD-10-CM | POA: Diagnosis not present

## 2020-07-27 NOTE — Telephone Encounter (Signed)
Re faxed ROI

## 2020-08-02 NOTE — Telephone Encounter (Signed)
[facility] at [phone] checking on status of medical records. Transferred to [facility] voicemail- I left a message on her machine requesting call back regarding medical records.

## 2020-08-13 NOTE — Telephone Encounter (Signed)
Records received. Placed in PCP red folder for review.

## 2020-08-16 NOTE — Telephone Encounter (Signed)
Records reviewed: Reportedly had several scopes: Sigmoidoscopy 12/29/1998 Had a EGD 12/29/2006 Colonoscopy 09/28/2017 No endoscopy reports per se.  Will abstract labs and vaccines

## 2020-08-31 DIAGNOSIS — J320 Chronic maxillary sinusitis: Secondary | ICD-10-CM | POA: Diagnosis not present

## 2020-08-31 DIAGNOSIS — J338 Other polyp of sinus: Secondary | ICD-10-CM | POA: Diagnosis not present

## 2020-09-28 ENCOUNTER — Telehealth: Payer: Self-pay | Admitting: Internal Medicine

## 2020-09-28 DIAGNOSIS — Z0000 Encounter for general adult medical examination without abnormal findings: Secondary | ICD-10-CM

## 2020-09-28 DIAGNOSIS — J338 Other polyp of sinus: Secondary | ICD-10-CM | POA: Diagnosis not present

## 2020-09-28 DIAGNOSIS — J320 Chronic maxillary sinusitis: Secondary | ICD-10-CM | POA: Diagnosis not present

## 2020-09-28 NOTE — Telephone Encounter (Signed)
Patient states he would like to get his lab work before his appointment on 10/12/20

## 2020-09-28 NOTE — Telephone Encounter (Signed)
Please advise 

## 2020-10-01 NOTE — Telephone Encounter (Signed)
CMP, FLP, CBC, TSH, PSA.  DX CPX

## 2020-10-01 NOTE — Addendum Note (Signed)
Addended byDamita Dunnings D on: 10/01/2020 01:03 PM   Modules accepted: Orders

## 2020-10-01 NOTE — Telephone Encounter (Signed)
Please schedule fasting lab appt several days prior to cpx appt. Thank you.

## 2020-10-02 NOTE — Telephone Encounter (Signed)
appt scheduled with pt

## 2020-10-03 ENCOUNTER — Other Ambulatory Visit (INDEPENDENT_AMBULATORY_CARE_PROVIDER_SITE_OTHER): Payer: BC Managed Care – PPO

## 2020-10-03 ENCOUNTER — Other Ambulatory Visit: Payer: Self-pay

## 2020-10-03 DIAGNOSIS — Z0000 Encounter for general adult medical examination without abnormal findings: Secondary | ICD-10-CM

## 2020-10-03 DIAGNOSIS — E785 Hyperlipidemia, unspecified: Secondary | ICD-10-CM | POA: Diagnosis not present

## 2020-10-04 LAB — CBC WITH DIFFERENTIAL/PLATELET
Absolute Monocytes: 685 cells/uL (ref 200–950)
Basophils Absolute: 41 cells/uL (ref 0–200)
Basophils Relative: 0.9 %
Eosinophils Absolute: 51 cells/uL (ref 15–500)
Eosinophils Relative: 1.1 %
HCT: 43 % (ref 38.5–50.0)
Hemoglobin: 14.6 g/dL (ref 13.2–17.1)
Lymphs Abs: 1684 cells/uL (ref 850–3900)
MCH: 30.7 pg (ref 27.0–33.0)
MCHC: 34 g/dL (ref 32.0–36.0)
MCV: 90.3 fL (ref 80.0–100.0)
MPV: 11.9 fL (ref 7.5–12.5)
Monocytes Relative: 14.9 %
Neutro Abs: 2139 cells/uL (ref 1500–7800)
Neutrophils Relative %: 46.5 %
Platelets: 202 10*3/uL (ref 140–400)
RBC: 4.76 10*6/uL (ref 4.20–5.80)
RDW: 12.8 % (ref 11.0–15.0)
Total Lymphocyte: 36.6 %
WBC: 4.6 10*3/uL (ref 3.8–10.8)

## 2020-10-04 LAB — COMPREHENSIVE METABOLIC PANEL
AG Ratio: 1.6 (calc) (ref 1.0–2.5)
ALT: 30 U/L (ref 9–46)
AST: 22 U/L (ref 10–40)
Albumin: 4.6 g/dL (ref 3.6–5.1)
Alkaline phosphatase (APISO): 49 U/L (ref 36–130)
BUN: 21 mg/dL (ref 7–25)
CO2: 25 mmol/L (ref 20–32)
Calcium: 9.6 mg/dL (ref 8.6–10.3)
Chloride: 102 mmol/L (ref 98–110)
Creat: 1.16 mg/dL (ref 0.60–1.35)
Globulin: 2.8 g/dL (calc) (ref 1.9–3.7)
Glucose, Bld: 92 mg/dL (ref 65–99)
Potassium: 4.5 mmol/L (ref 3.5–5.3)
Sodium: 136 mmol/L (ref 135–146)
Total Bilirubin: 0.9 mg/dL (ref 0.2–1.2)
Total Protein: 7.4 g/dL (ref 6.1–8.1)

## 2020-10-04 LAB — PSA: PSA: 0.42 ng/mL (ref <=4.0)

## 2020-10-04 LAB — LIPID PANEL
Cholesterol: 226 mg/dL — ABNORMAL HIGH (ref <200)
HDL: 39 mg/dL — ABNORMAL LOW (ref >=40)
LDL Cholesterol (Calc): 164 mg/dL (calc) — ABNORMAL HIGH
Non-HDL Cholesterol (Calc): 187 mg/dL (calc) — ABNORMAL HIGH (ref <130)
Total CHOL/HDL Ratio: 5.8 (calc) — ABNORMAL HIGH (ref <5.0)
Triglycerides: 112 mg/dL (ref <150)

## 2020-10-04 LAB — TSH: TSH: 2.59 mIU/L (ref 0.40–4.50)

## 2020-10-11 ENCOUNTER — Telehealth: Payer: Self-pay | Admitting: Internal Medicine

## 2020-10-11 NOTE — Telephone Encounter (Signed)
[facility] [place])  called in reference to this patient calling their office to schedule an appointment. However, their office does not have referral information , please re-fax referral to there office

## 2020-10-11 NOTE — Telephone Encounter (Signed)
refaxed spoke to [facility] and she did receive it

## 2020-10-12 ENCOUNTER — Other Ambulatory Visit: Payer: Self-pay

## 2020-10-12 ENCOUNTER — Encounter: Payer: Self-pay | Admitting: Internal Medicine

## 2020-10-12 ENCOUNTER — Ambulatory Visit (INDEPENDENT_AMBULATORY_CARE_PROVIDER_SITE_OTHER): Payer: BC Managed Care – PPO | Admitting: Internal Medicine

## 2020-10-12 VITALS — BP 134/86 | HR 82 | Temp 98.1°F | Resp 18 | Ht 68.0 in | Wt 234.5 lb

## 2020-10-12 DIAGNOSIS — Z23 Encounter for immunization: Secondary | ICD-10-CM

## 2020-10-12 DIAGNOSIS — G4733 Obstructive sleep apnea (adult) (pediatric): Secondary | ICD-10-CM | POA: Diagnosis not present

## 2020-10-12 DIAGNOSIS — Z0000 Encounter for general adult medical examination without abnormal findings: Secondary | ICD-10-CM

## 2020-10-12 DIAGNOSIS — E785 Hyperlipidemia, unspecified: Secondary | ICD-10-CM | POA: Diagnosis not present

## 2020-10-12 NOTE — Progress Notes (Signed)
Pre visit review using our clinic review tool, if applicable. No additional management support is needed unless otherwise documented below in the visit note. 

## 2020-10-12 NOTE — Patient Instructions (Addendum)
GENERAL INFORMATION ABOUT HEALTHY EATING, CHOLESTEROL, HIGH BLOOD PRESSURE, QUIT TOBACCO (if you smoke):  The American Heart Association, www.heart.org  Check the "Life's Simple 7" from the [facility] The American diabetes Association www.diabetes.org  "[facility] [place]" book   [place], [place] back for blod work only in 6 months , fasting     Come back for a physical exam in 1 year

## 2020-10-12 NOTE — Progress Notes (Signed)
Subjective:    Patient ID: Scott Phillips, male    DOB: Apr 21, 1971, 49 y.o.   MRN: [id number]  DOS:  10/12/2020 Type of visit - description: CPX In general feeling well.   Review of Systems   A 14 point review of systems is negative    Past Medical History:  Diagnosis Date  . Allergic rhinosinusitis   . Allergy   . Arthritis   . Colon polyps   . Depression   . Diverticulitis   . GERD (gastroesophageal reflux disease)   . Sinusitis   . Sleep apnea     Past Surgical History:  Procedure Laterality Date  . KNEE ARTHROSCOPY Right 1991   cyst removal  . MAXILLARY ANTROSTOMY Right 06/29/2020   Procedure: RIGHT MAXILLARY ANTROSTOMY WITH TISSUE REMOVAL;  Surgeon: Leta Baptist, MD;  Location: [place];  Service: ENT;  Laterality: Right;  . NASAL SINUS SURGERY     x3 in [place]  . SUBACROMIAL DECOMPRESSION Left   . TRIGGER FINGER RELEASE Right 2018   R 3rd  . VASECTOMY  2019    Allergies as of 10/12/2020      Reactions   Milk-related Compounds       Medication List       Accurate as of October 12, 2020 11:59 PM. If you have any questions, ask your nurse or doctor.        b complex vitamins tablet Take 1 tablet by mouth 3 (three) times a week.   CALCIUM-MAGNESUIUM-ZINC PO Take by mouth 2 (two) times daily.   cetirizine 10 MG tablet Commonly known as: ZYRTEC Take 10 mg by mouth daily.   famotidine 20 MG tablet Commonly known as: PEPCID Take 1 tablet (20 mg total) by mouth 2 (two) times daily before a meal.   FIBER CHOICE PO Take by mouth 5 (five) times daily.   fluticasone 50 MCG/ACT nasal spray Commonly known as: FLONASE Place 2 sprays into both nostrils daily.   MULTIVITAMIN ADULT PO Take by mouth.   pantoprazole 40 MG tablet Commonly known as: PROTONIX Take 1 tablet (40 mg total) by mouth 2 (two) times daily before a meal.   Probiotic-10 Chew Chew by mouth.   Turmeric Curcumin Caps Take 1,000 each by mouth.            Objective:   Physical Exam BP 134/86 (BP Location: Left Arm, Patient Position: Sitting, Cuff Size: Normal)   Pulse 82   Temp 98.1 F (36.7 C) (Oral)   Resp 18   Ht 5\' 8"  (1.727 m)   Wt 234 lb 8 oz (106.4 kg)   SpO2 96%   BMI 35.66 kg/m  General: Well developed, NAD, BMI noted Neck: No  thyromegaly  HEENT:  Normocephalic . Face symmetric, atraumatic Lungs:  CTA B Normal respiratory effort, no intercostal retractions, no accessory muscle use. Heart: RRR,  no murmur.  Abdomen:  Not distended, soft, non-tender. No rebound or rigidity.   Lower extremities: no pretibial edema bilaterally  Skin: Exposed areas without rash. Not pale. Not jaundice Neurologic:  alert & oriented X3.  Speech normal, gait appropriate for age and unassisted Strength symmetric and appropriate for age.  Psych: Cognition and judgment appear intact.  Cooperative with normal attention span and concentration.  Behavior appropriate. No anxious or depressed appearing.     Assessment    ASSESSMENT New patient 06-2020, moved from [place]. OSA , dx ~2010, +CPAP GERD Colon Polyps H/o Diverticulitis  H/o Depression  Allergies  Sinus issues: Surgery 2012, surgery 06/2020  PLAN Here for CPX OSA: Needs to see a new provider, referral. Sinusitis chronic, had surgery 06-2020 doing well. Slightly elevated cholesterol: LDL 164, would like to see the cholesterol lower despite 10 year CV RF being low at 4%, diet and exercise discussed.   RTC 6 months for FLP RTC 1 year CPX   This visit occurred during the SARS-CoV-2 public health emergency.  Safety protocols were in place, including screening questions prior to the visit, additional usage of staff PPE, and extensive cleaning of exam room while observing appropriate contact time as indicated for disinfecting solutions.

## 2020-10-14 DIAGNOSIS — Z0000 Encounter for general adult medical examination without abnormal findings: Secondary | ICD-10-CM | POA: Insufficient documentation

## 2020-10-14 NOTE — Assessment & Plan Note (Signed)
Here for CPX OSA: Needs to see a new provider, referral. Sinusitis chronic, had surgery 06-2020 doing well. Slightly elevated cholesterol: LDL 164, would like to see the cholesterol lower despite 10 year CV RF being low at 4%, diet and exercise discussed.   RTC 6 months for FLP RTC 1 year CPX

## 2020-10-14 NOTE — Assessment & Plan Note (Signed)
Recent CPX labs done and reviewed with the patient. Td 2018 -Covid -19 JJ:  12/2019  - flu shot today -CCS:  C-scope done 09/28/2017, according to report procedure done because LLQ abdominal pain and abnormal GI x-ray.  Findings: Cecum polyp, diverticulosis, BX: Sessile serrated polyp.  Next in 5 years per literature (discussed informally with Dr. Bryan Lemma who agrees, appreciate his help) -Prostate cancer screening: No DRE done, no family history, PSA was normal. -Patient education: Long conversation about diet, exercise.  Weight watchers?  Calorie counting?

## 2020-10-16 ENCOUNTER — Telehealth: Payer: Self-pay | Admitting: Internal Medicine

## 2020-10-16 DIAGNOSIS — T7840XA Allergy, unspecified, initial encounter: Secondary | ICD-10-CM

## 2020-10-16 NOTE — Telephone Encounter (Signed)
Patient is requesting a referral to [facility] [employer] (Allergist) [address] [place], [place]

## 2020-10-16 NOTE — Telephone Encounter (Signed)
Referral placed.

## 2020-10-24 DIAGNOSIS — L720 Epidermal cyst: Secondary | ICD-10-CM | POA: Diagnosis not present

## 2020-10-24 DIAGNOSIS — D229 Melanocytic nevi, unspecified: Secondary | ICD-10-CM | POA: Diagnosis not present

## 2020-10-24 DIAGNOSIS — D239 Other benign neoplasm of skin, unspecified: Secondary | ICD-10-CM | POA: Diagnosis not present

## 2020-11-14 ENCOUNTER — Institutional Professional Consult (permissible substitution): Payer: BC Managed Care – PPO | Admitting: Pulmonary Disease

## 2020-11-15 ENCOUNTER — Encounter: Payer: Self-pay | Admitting: Allergy and Immunology

## 2020-11-15 ENCOUNTER — Other Ambulatory Visit: Payer: Self-pay

## 2020-11-15 ENCOUNTER — Ambulatory Visit (INDEPENDENT_AMBULATORY_CARE_PROVIDER_SITE_OTHER): Payer: BC Managed Care – PPO | Admitting: Allergy and Immunology

## 2020-11-15 VITALS — BP 116/74 | HR 70 | Temp 98.5°F | Resp 16 | Ht 68.5 in | Wt 238.0 lb

## 2020-11-15 DIAGNOSIS — J320 Chronic maxillary sinusitis: Secondary | ICD-10-CM | POA: Insufficient documentation

## 2020-11-15 DIAGNOSIS — H1010 Acute atopic conjunctivitis, unspecified eye: Secondary | ICD-10-CM | POA: Insufficient documentation

## 2020-11-15 DIAGNOSIS — H1013 Acute atopic conjunctivitis, bilateral: Secondary | ICD-10-CM

## 2020-11-15 DIAGNOSIS — J3089 Other allergic rhinitis: Secondary | ICD-10-CM | POA: Diagnosis not present

## 2020-11-15 DIAGNOSIS — J329 Chronic sinusitis, unspecified: Secondary | ICD-10-CM | POA: Insufficient documentation

## 2020-11-15 MED ORDER — AZELASTINE HCL 0.1 % NA SOLN: NASAL | 5 refills | Status: DC

## 2020-11-15 MED ORDER — CARBINOXAMINE MALEATE 4 MG PO TABS: 4.0000 mg | ORAL_TABLET | Freq: Four times a day (QID) | ORAL | 5 refills | Status: DC | PRN

## 2020-11-15 MED ORDER — OLOPATADINE HCL 0.2 % OP SOLN: 1.0000 [drp] | Freq: Every day | OPHTHALMIC | 5 refills | Status: DC | PRN

## 2020-11-15 MED ORDER — XHANCE 93 MCG/ACT NA EXHU: 2.0000 | INHALANT_SUSPENSION | Freq: Two times a day (BID) | NASAL | 5 refills | Status: DC | PRN

## 2020-11-15 NOTE — Assessment & Plan Note (Signed)
Environmental skin testing revealed reactivity to major mold mix #2, and to a lesser extent major mold mix #3 and #4.  Aeroallergen avoidance measures have been discussed and provided in written form.  A prescription has been provided for [facility], 2 sprays per nostril 2 times daily if needed.  A prescription has been provided for azelastine nasal spray, 1-2 sprays per nostril 2 times daily as needed.   Nasal saline lavage (NeilMed) has been recommended as needed and prior to medicated nasal sprays along with instructions for proper administration.  A prescription has been provided for carbinoxamine, 4 mg every 6-8 hours if needed.  If allergen avoidance measures and medications fail to adequately relieve symptoms, aeroallergen immunotherapy will be considered.

## 2020-11-15 NOTE — Assessment & Plan Note (Signed)
   Treatment plan as outlined above for allergic rhinitis.  A prescription has been provided for generic Pataday, one drop per eye daily as needed.  If insurance does not cover this medication, medicated allergy eyedrops may be purchased over-the-counter as Pataday Extra Strength or Zaditor.  I have also recommended eye lubricant drops (i.e., Natural Tears) as needed. 

## 2020-11-15 NOTE — Patient Instructions (Addendum)
Perennial allergic rhinitis Environmental skin testing revealed reactivity to major mold mix #2, and to a lesser extent major mold mix #3 and #4.  Aeroallergen avoidance measures have been discussed and provided in written form.  A prescription has been provided for [facility], 2 sprays per nostril 2 times daily if needed.  A prescription has been provided for azelastine nasal spray, 1-2 sprays per nostril 2 times daily as needed.   Nasal saline lavage (NeilMed) has been recommended as needed and prior to medicated nasal sprays along with instructions for proper administration.  A prescription has been provided for carbinoxamine, 4 mg every 6-8 hours if needed.  If allergen avoidance measures and medications fail to adequately relieve symptoms, aeroallergen immunotherapy will be considered.  Chronic sinusitis  Treatment plan as outlined above for allergic rhinitis.  For thick post nasal drainage, nasal congestion, and/or sinus pressure, add guaifenesin 1200 mg (Mucinex Maximum Strength) plus/minus pseudoephedrine 120 mg  twice daily as needed with adequate hydration as discussed. Pseudoephedrine is only to be used for short-term relief of nasal/sinus congestion. Long-term use is discouraged due to potential side effects.  Allergic conjunctivitis  Treatment plan as outlined above for allergic rhinitis.  A prescription has been provided for generic Pataday, one drop per eye daily as needed.  If insurance does not cover this medication, medicated allergy eyedrops may be purchased over-the-counter as Restaurant manager, fast food.  I have also recommended eye lubricant drops (i.e., Natural Tears) as needed.   Return in about 3 months (around 02/15/2021), or if symptoms worsen or fail to improve.   Control of Mold Allergen  Mold and fungi can grow on a variety of surfaces provided certain temperature and moisture conditions exist.  Outdoor molds grow on plants, decaying vegetation and  soil.  The major outdoor mold, Alternaria and Cladosporium, are found in very high numbers during hot and dry conditions.  Generally, a late Summer - Fall peak is seen for common outdoor fungal spores.  Rain will temporarily lower outdoor mold spore count, but counts rise rapidly when the rainy period ends.  The most important indoor molds are Aspergillus and Penicillium.  Dark, humid and poorly ventilated basements are ideal sites for mold growth.  The next most common sites of mold growth are the bathroom and the kitchen.  Outdoor [employer] 1. Use air conditioning and keep windows closed 2. Avoid exposure to decaying vegetation. 3. Avoid leaf raking. 4. Avoid grain handling. 5. Consider wearing a face mask if working in moldy areas.  Indoor Mold Control 1. Maintain humidity below 50%. 2. Clean washable surfaces with 5% bleach solution. 3. Remove sources e.g. Contaminated carpets.

## 2020-11-15 NOTE — Progress Notes (Signed)
New Patient Note  RE: Scott Phillips MRN: [id number] DOB: [birth year] Date of Office Visit: 11/15/2020  Referring provider: Colon Branch, MD Primary care provider: Colon Branch, MD  Chief Complaint: Sinus Problem and Nasal Congestion   History of present illness: Scott Phillips is a 49 y.o. male seen today in consultation requested by [name], MD.  He complains of persistent nasal congestion, sneezing, postnasal drainage, sinus pressure over the forehead and cheekbones, and ocular pruritus.  The symptoms occur year-round but are more frequent and severe during times of season change, particularly in the springtime.  He attempts to control the symptoms with cetirizine and fluticasone nasal spray with moderate benefit.  The patient had balloon sinuplasty, turbinate reduction in 2011 and "fungal sinus infection removal with debridement" July 2021.  He did not experience significant relief from these procedures.  He underwent environmental epicutaneous testing approximately 9 years ago which did not reveal sensitivity.  Assessment and plan: Perennial allergic rhinitis Environmental skin testing revealed reactivity to major mold mix #2, and to a lesser extent major mold mix #3 and #4.  Aeroallergen avoidance measures have been discussed and provided in written form.  A prescription has been provided for [facility], 2 sprays per nostril 2 times daily if needed.  A prescription has been provided for azelastine nasal spray, 1-2 sprays per nostril 2 times daily as needed.   Nasal saline lavage (NeilMed) has been recommended as needed and prior to medicated nasal sprays along with instructions for proper administration.  A prescription has been provided for carbinoxamine, 4 mg every 6-8 hours if needed.  If allergen avoidance measures and medications fail to adequately relieve symptoms, aeroallergen immunotherapy will be considered.  Chronic sinusitis  Treatment plan as outlined above for  allergic rhinitis.  For thick post nasal drainage, nasal congestion, and/or sinus pressure, add guaifenesin 1200 mg (Mucinex Maximum Strength) plus/minus pseudoephedrine 120 mg  twice daily as needed with adequate hydration as discussed. Pseudoephedrine is only to be used for short-term relief of nasal/sinus congestion. Long-term use is discouraged due to potential side effects.  Allergic conjunctivitis  Treatment plan as outlined above for allergic rhinitis.  A prescription has been provided for generic Pataday, one drop per eye daily as needed.  If insurance does not cover this medication, medicated allergy eyedrops may be purchased over-the-counter as Restaurant manager, fast food.  I have also recommended eye lubricant drops (i.e., Natural Tears) as needed.   Meds ordered this encounter  Medications  . Fluticasone Propionate (XHANCE) 93 MCG/ACT EXHU    Sig: Place 2 puffs into the nose 2 (two) times daily as needed.    Dispense:  32 mL    Refill:  5  . azelastine (ASTELIN) 0.1 % nasal spray    Sig: 1-2 sprays each nostril twice daily as needed    Dispense:  30 mL    Refill:  5  . Carbinoxamine Maleate 4 MG TABS    Sig: Take 1 tablet (4 mg total) by mouth every 6 (six) hours as needed.    Dispense:  60 tablet    Refill:  5  . Olopatadine HCl (PATADAY) 0.2 % SOLN    Sig: Place 1 drop into both eyes daily as needed.    Dispense:  2.5 mL    Refill:  5    Diagnostics: Epicutaneous testing: Negative despite a positive histamine control. Intradermal testing: Reactive to major mold mix #2, #3, and #4.   Physical examination: Blood  pressure 116/74, pulse 70, temperature 98.5 F (36.9 C), temperature source Oral, resp. rate 16, height 5' 8.5" (1.74 m), weight 238 lb (108 kg), SpO2 98 %.  General: Alert, interactive, in no acute distress. HEENT: TMs pearly gray, turbinates moderately edematous without discharge, post-pharynx moderately erythematous. Neck: Supple without  lymphadenopathy. Lungs: Clear to auscultation without wheezing, rhonchi or rales. CV: Normal S1, S2 without murmurs. Abdomen: Nondistended, nontender. Skin: Warm and dry, without lesions or rashes. Extremities:  No clubbing, cyanosis or edema. Neuro:   Grossly intact.  Review of systems:  Review of systems negative except as noted in HPI / PMHx or noted below: Review of Systems  Constitutional: Negative.   HENT: Negative.   Eyes: Negative.   Respiratory: Negative.   Cardiovascular: Negative.   Gastrointestinal: Negative.   Genitourinary: Negative.   Musculoskeletal: Negative.   Skin: Negative.   Neurological: Negative.   Endo/Heme/Allergies: Negative.   Psychiatric/Behavioral: Negative.     Past medical history:  Past Medical History:  Diagnosis Date  . Allergic rhinosinusitis   . Allergy   . Arthritis   . Colon polyps   . Depression   . Diverticulitis   . GERD (gastroesophageal reflux disease)   . Sinusitis   . Sleep apnea     Past surgical history:  Past Surgical History:  Procedure Laterality Date  . KNEE ARTHROSCOPY Right 1991   cyst removal  . MAXILLARY ANTROSTOMY Right 06/29/2020   Procedure: RIGHT MAXILLARY ANTROSTOMY WITH TISSUE REMOVAL;  Surgeon: Leta Baptist, MD;  Location: [place];  Service: ENT;  Laterality: Right;  . NASAL SINUS SURGERY     x3 in [place]  . SUBACROMIAL DECOMPRESSION Left   . TRIGGER FINGER RELEASE Right 2018   R 3rd  . VASECTOMY  2019    Family history: Family History  Problem Relation Age of Onset  . Cancer Mother   . Depression Mother   . Hypertension Mother   . Miscarriages / [place] Mother   . Cancer Father   . Alcohol abuse Father   . Arthritis Father   . Diabetes Father   . Hearing loss Father   . Allergic rhinitis Sister   . Depression Maternal Grandmother   . Hearing loss Maternal Grandfather   . Asthma Maternal Grandfather   . Diabetes Paternal Grandfather   . Colon cancer Neg Hx   .  Prostate cancer Neg Hx     Social history: Social History   Socioeconomic History  . Marital status: Married    Spouse name: Not on file  . Number of children: 1  . Years of education: Not on file  . Highest education level: Not on file  Occupational History  . 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 Smoking status: Never Smoker  . Smokeless tobacco: Never Used  Vaping Use  . Vaping Use: Never used  Substance and Sexual Activity  . Alcohol use: Yes    Comment: occas  . Drug use: Never  . Sexual activity: Not on file  Other Topics Concern  . Not on file  Social History Narrative   Daughter born  2018   Social Determinants of Health   Financial Resource Strain:   . Difficulty of Paying Living Expenses: Not on file  Food Insecurity:   . Worried About Charity fundraiser in the Last Year: Not on file  . Ran Out of Food in the Last Year: Not on file  Transportation  Needs:   . Lack of Transportation (Medical): Not on file  . Lack of Transportation (Non-Medical): Not on file  Physical Activity:   . Days of Exercise per Week: Not on file  . Minutes of Exercise per Session: Not on file  Stress:   . Feeling of Stress : Not on file  Social Connections:   . Frequency of Communication with Friends and Family: Not on file  . Frequency of Social Gatherings with Friends and Family: Not on file  . Attends Religious Services: Not on file  . Active Member of Clubs or Organizations: Not on file  . Attends Archivist Meetings: Not on file  . Marital Status: Not on file  Intimate Partner Violence:   . Fear of Current or Ex-Partner: Not on file  . Emotionally Abused: Not on file  . Physically Abused: Not on file  . Sexually Abused: Not on file    Environmental History: Patient lives in a 47 year old house with carpeting throughout and central air/heat.  There is a dog in the home which does not have access to his bedroom.  He is a former cigarette  smoker having started and 1992 and quit in 2010 averaging 2 packs/day while smoking.  Current Outpatient Medications  Medication Sig Dispense Refill  . b complex vitamins tablet Take 1 tablet by mouth 3 (three) times a week.    . Calcium-Magnesium-Zinc (CALCIUM-MAGNESUIUM-ZINC PO) Take by mouth 2 (two) times daily.    . cetirizine (ZYRTEC) 10 MG tablet Take 10 mg by mouth daily.    . famotidine (PEPCID) 20 MG tablet Take 1 tablet (20 mg total) by mouth 2 (two) times daily before a meal. 180 tablet 1  . fluticasone (FLONASE) 50 MCG/ACT nasal spray Place 2 sprays into both nostrils daily.    . Inulin (FIBER CHOICE PO) Take by mouth 5 (five) times daily.    . Misc Natural Products (TURMERIC CURCUMIN) CAPS Take 1,000 each by mouth.    . Multiple Vitamin (MULTIVITAMIN ADULT PO) Take by mouth.    . pantoprazole (PROTONIX) 40 MG tablet Take 1 tablet (40 mg total) by mouth 2 (two) times daily before a meal. 180 tablet 3  . Probiotic Product (PROBIOTIC-10) CHEW Chew by mouth.    Marland Kitchen azelastine (ASTELIN) 0.1 % nasal spray 1-2 sprays each nostril twice daily as needed 30 mL 5  . Carbinoxamine Maleate 4 MG TABS Take 1 tablet (4 mg total) by mouth every 6 (six) hours as needed. 60 tablet 5  . Fluticasone Propionate (XHANCE) 93 MCG/ACT EXHU Place 2 puffs into the nose 2 (two) times daily as needed. 32 mL 5  . Olopatadine HCl (PATADAY) 0.2 % SOLN Place 1 drop into both eyes daily as needed. 2.5 mL 5   No current facility-administered medications for this visit.    Known medication allergies: Allergies  Allergen Reactions  . Milk-Related Compounds     I appreciate the opportunity to take part in Burtis's care. Please do not hesitate to contact me with questions.  Sincerely,   R. Edgar Frisk, MD

## 2020-11-15 NOTE — Assessment & Plan Note (Signed)
   Treatment plan as outlined above for allergic rhinitis.  For thick post nasal drainage, nasal congestion, and/or sinus pressure, add guaifenesin 1200 mg (Mucinex Maximum Strength) plus/minus pseudoephedrine 120 mg  twice daily as needed with adequate hydration as discussed. Pseudoephedrine is only to be used for short-term relief of nasal/sinus congestion. Long-term use is discouraged due to potential side effects.

## 2020-11-27 ENCOUNTER — Other Ambulatory Visit: Payer: Self-pay

## 2020-11-27 ENCOUNTER — Ambulatory Visit (INDEPENDENT_AMBULATORY_CARE_PROVIDER_SITE_OTHER): Payer: BC Managed Care – PPO | Admitting: Pulmonary Disease

## 2020-11-27 ENCOUNTER — Encounter: Payer: Self-pay | Admitting: Pulmonary Disease

## 2020-11-27 VITALS — BP 122/74 | HR 69 | Temp 98.5°F | Ht 68.0 in | Wt 242.2 lb

## 2020-11-27 DIAGNOSIS — G4733 Obstructive sleep apnea (adult) (pediatric): Secondary | ICD-10-CM | POA: Diagnosis not present

## 2020-11-27 NOTE — Patient Instructions (Signed)
We will set you up with a DME company -Prescription for CPAP supplies  Record release -To get information from where you had your sleep study performed   Continue use of CPAP  Continue weight loss efforts  I will see you on a yearly basis as long as you are not having any issues

## 2020-11-27 NOTE — Progress Notes (Signed)
Scott Phillips    [id number]    02-22-1971  Primary Care Physician:Paz, Alda Berthold, MD  Referring Physician: Colon Branch, MD [address] [place],  [place] [address]  Chief complaint:   Patient with a history of obstructive sleep apnea  HPI:  Relocated recently Needs CPAP supplies  Diagnosed with obstructive sleep apnea over 10 years ago Has been using CPAP on a regular basis  Continues to benefit from using CPAP Denies significant sleepiness during the day  Bedtime is usually between 11 and 1230 Falls asleep quickly About 1 awakening Final wake up time about 715  Weight fluctuates  Current machine is about 47 years old Continues to work well  Not having any significant daytime symptoms  He stated his old study just met the threshold for treatment  Outpatient Encounter Medications as of 11/27/2020  Medication Sig  . azelastine (ASTELIN) 0.1 % nasal spray 1-2 sprays each nostril twice daily as needed  . b complex vitamins tablet Take 1 tablet by mouth 3 (three) times a week.  . Calcium-Magnesium-Zinc (CALCIUM-MAGNESUIUM-ZINC PO) Take by mouth 2 (two) times daily.  . Carbinoxamine Maleate 4 MG TABS Take 1 tablet (4 mg total) by mouth every 6 (six) hours as needed.  . famotidine (PEPCID) 20 MG tablet Take 1 tablet (20 mg total) by mouth 2 (two) times daily before a meal.  . fluticasone (FLONASE) 50 MCG/ACT nasal spray Place 2 sprays into both nostrils daily.  . Fluticasone Propionate (XHANCE) 93 MCG/ACT EXHU Place 2 puffs into the nose 2 (two) times daily as needed.  . Inulin (FIBER CHOICE PO) Take by mouth 5 (five) times daily.  . Misc Natural Products (TURMERIC CURCUMIN) CAPS Take 1,000 each by mouth.  . Multiple Vitamin (MULTIVITAMIN ADULT PO) Take by mouth.  . Olopatadine HCl (PATADAY) 0.2 % SOLN Place 1 drop into both eyes daily as needed.  . pantoprazole (PROTONIX) 40 MG tablet Take 1 tablet (40 mg total) by mouth 2 (two) times daily before  a meal.  . Probiotic Product (PROBIOTIC-10) CHEW Chew by mouth.  . [DISCONTINUED] cetirizine (ZYRTEC) 10 MG tablet Take 10 mg by mouth daily.   No facility-administered encounter medications on file as of 11/27/2020.    Allergies as of 11/27/2020 - Review Complete 11/27/2020  Allergen Reaction Noted  . Milk-related compounds  06/29/2020    Past Medical History:  Diagnosis Date  . Allergic rhinosinusitis   . Allergy   . Arthritis   . Colon polyps   . Depression   . Diverticulitis   . GERD (gastroesophageal reflux disease)   . Sinusitis   . Sleep apnea     Past Surgical History:  Procedure Laterality Date  . KNEE ARTHROSCOPY Right 1991   cyst removal  . MAXILLARY ANTROSTOMY Right 06/29/2020   Procedure: RIGHT MAXILLARY ANTROSTOMY WITH TISSUE REMOVAL;  Surgeon: Leta Baptist, MD;  Location: [facility];  Service: ENT;  Laterality: Right;  . NASAL SINUS SURGERY     x3 in [place]  . SUBACROMIAL DECOMPRESSION Left   . TRIGGER FINGER RELEASE Right 2018   R 3rd  . VASECTOMY  2019    Family History  Problem Relation Age of Onset  . Cancer Mother   . Depression Mother   . Hypertension Mother   . Miscarriages / [place] Mother   . Cancer Father   . Alcohol abuse Father   . Arthritis Father   .  Diabetes Father   . Hearing loss Father   . Allergic rhinitis Sister   . Depression Maternal Grandmother   . Hearing loss Maternal Grandfather   . Asthma Maternal Grandfather   . Diabetes Paternal Grandfather   . Colon cancer Neg Hx   . Prostate cancer Neg Hx     Social History   Socioeconomic History  . Marital status: Married    Spouse name: Not on file  . Number of children: 1  . Years of education: Not on file  . Highest education level: Not on file  Occupational History  . 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 Smoking status: Former Smoker    Packs/day: 2.00    Types: Cigarettes    Start date: 12/30/1991    Quit date:  12/29/2014    Years since quitting: 5.9  . Smokeless tobacco: Never Used  Vaping Use  . Vaping Use: Never used  Substance and Sexual Activity  . Alcohol use: Yes    Comment: occas  . Drug use: Never  . Sexual activity: Not on file  Other Topics Concern  . Not on file  Social History Narrative   Daughter born  2018   Social Determinants of Health   Financial Resource Strain:   . Difficulty of Paying Living Expenses: Not on file  Food Insecurity:   . Worried About Charity fundraiser in the Last Year: Not on file  . Ran Out of Food in the Last Year: Not on file  Transportation Needs:   . Lack of Transportation (Medical): Not on file  . Lack of Transportation (Non-Medical): Not on file  Physical Activity:   . Days of Exercise per Week: Not on file  . Minutes of Exercise per Session: Not on file  Stress:   . Feeling of Stress : Not on file  Social Connections:   . Frequency of Communication with Friends and Family: Not on file  . Frequency of Social Gatherings with Friends and Family: Not on file  . Attends Religious Services: Not on file  . Active Member of Clubs or Organizations: Not on file  . Attends Archivist Meetings: Not on file  . Marital Status: Not on file  Intimate Partner Violence:   . Fear of Current or Ex-Partner: Not on file  . Emotionally Abused: Not on file  . Physically Abused: Not on file  . Sexually Abused: Not on file    Review of Systems  Respiratory: Positive for apnea.   Psychiatric/Behavioral: Positive for sleep disturbance.  All other systems reviewed and are negative.   Vitals:   11/27/20 1152  BP: 122/74  Pulse: 69  Temp: 98.5 F (36.9 C)  SpO2: 99%     Physical Exam Constitutional:      Appearance: He is obese.  HENT:     Mouth/Throat:     Mouth: Mucous membranes are moist.     Comments: Crowded oropharynx Eyes:     General:        Right eye: No discharge.        Left eye: No discharge.  Neck:     Comments:  17-1/2 neck size  Cardiovascular:     Rate and Rhythm: Normal rate and regular rhythm.     Heart sounds: No friction rub.  Pulmonary:     Effort: Pulmonary effort is normal. No respiratory distress.     Breath sounds: No stridor. No wheezing or rhonchi.  Musculoskeletal:     Cervical back: No rigidity or tenderness.  Neurological:     Mental Status: He is alert.  Psychiatric:        Mood and Affect: Mood normal.    Results of the Epworth flowsheet 11/27/2020  Sitting and reading 1  Watching TV 1  Sitting, inactive in a public place (e.g. a theatre or a meeting) 0  As a passenger in a car for an hour without a break 1  Lying down to rest in the afternoon when circumstances permit 3  Sitting and talking to someone 0  Sitting quietly after a lunch without alcohol 0  In a car, while stopped for a few minutes in traffic 0  Total score 6    Data Reviewed: [place]-we will request records  Assessment:  Obstructive sleep apnea -Claims 100% compliance with CPAP therapy -Has no significant daytime symptoms  Obesity -Encouraged about weight loss efforts -Diet and exercise as tolerated    Plan/Recommendations: We will set him up with a DME company for CPAP supplies  I do not believe we need to make any changes to CPAP therapy at present as he continues to benefit from treatment  We will try and set up compliance downloads once he is set up with a DME company  We will see him on a yearly basis -I will see sooner if needed   Sherrilyn Rist MD [facility] 11/27/2020, 12:08 PM  CC: Colon Branch, MD

## 2020-12-03 ENCOUNTER — Other Ambulatory Visit: Payer: Self-pay

## 2020-12-03 ENCOUNTER — Ambulatory Visit (INDEPENDENT_AMBULATORY_CARE_PROVIDER_SITE_OTHER): Payer: BC Managed Care – PPO | Admitting: Internal Medicine

## 2020-12-03 VITALS — BP 117/76 | HR 72 | Temp 98.1°F | Ht 68.0 in | Wt 239.0 lb

## 2020-12-03 DIAGNOSIS — H9202 Otalgia, left ear: Secondary | ICD-10-CM | POA: Diagnosis not present

## 2020-12-03 NOTE — Patient Instructions (Signed)
Tylenol  500 mg OTC 2 tabs a day every 8 hours as needed for pain  Call if no better

## 2020-12-03 NOTE — Progress Notes (Signed)
Subjective:    Patient ID: Scott Phillips, male    DOB: [birth year], 49 y.o.   MRN: [id number]  DOS:  12/03/2020 Type of visit - description: Acute 2 days ago noted left ear pain, at times severe, increase with swallowing. It is gradually improving but not gone.  Denies any fever, chills.  No runny nose or sore throat No ear pain or discharge No dental pain per se but he did some flossing and he thinks he hurt his left-sided gum    Review of Systems See above   Past Medical History:  Diagnosis Date  . Allergic rhinosinusitis   . Allergy   . Arthritis   . Colon polyps   . Depression   . Diverticulitis   . GERD (gastroesophageal reflux disease)   . Sinusitis   . Sleep apnea     Past Surgical History:  Procedure Laterality Date  . KNEE ARTHROSCOPY Right 1991   cyst removal  . MAXILLARY ANTROSTOMY Right 06/29/2020   Procedure: RIGHT MAXILLARY ANTROSTOMY WITH TISSUE REMOVAL;  Surgeon: Leta Baptist, MD;  Location: [facility];  Service: ENT;  Laterality: Right;  . NASAL SINUS SURGERY     x3 in [place]  . SUBACROMIAL DECOMPRESSION Left   . TRIGGER FINGER RELEASE Right 2018   R 3rd  . VASECTOMY  2019    Allergies as of 12/03/2020      Reactions   Milk-related Compounds       Medication List       Accurate as of December 03, 2020 11:29 AM. If you have any questions, ask your nurse or doctor.        azelastine 0.1 % nasal spray Commonly known as: ASTELIN 1-2 sprays each nostril twice daily as needed   b complex vitamins tablet Take 1 tablet by mouth 3 (three) times a week.   CALCIUM-MAGNESUIUM-ZINC PO Take by mouth 2 (two) times daily.   Carbinoxamine Maleate 4 MG Tabs Take 1 tablet (4 mg total) by mouth every 6 (six) hours as needed.   famotidine 20 MG tablet Commonly known as: PEPCID Take 1 tablet (20 mg total) by mouth 2 (two) times daily before a meal.   FIBER CHOICE PO Take by mouth 5 (five) times daily.   fluticasone 50 MCG/ACT  nasal spray Commonly known as: FLONASE Place 2 sprays into both nostrils daily.   Xhance 93 MCG/ACT Exhu Generic drug: Fluticasone Propionate Place 2 puffs into the nose 2 (two) times daily as needed.   MULTIVITAMIN ADULT PO Take by mouth.   Olopatadine HCl 0.2 % Soln Commonly known as: Pataday Place 1 drop into both eyes daily as needed.   pantoprazole 40 MG tablet Commonly known as: PROTONIX Take 1 tablet (40 mg total) by mouth 2 (two) times daily before a meal.   Probiotic-10 Chew Chew by mouth.   Turmeric Curcumin Caps Take 1,000 each by mouth.          Objective:   Physical Exam BP 117/76 (BP Location: Left Arm, Patient Position: Sitting, Cuff Size: Large)   Pulse 72   Temp 98.1 F (36.7 C) (Oral)   Ht 5\' 8"  (1.727 m)   Wt 239 lb (108.4 kg)   SpO2 98%   BMI 36.34 kg/m  General:   Well developed, NAD, BMI noted. HEENT:  Normocephalic . Face symmetric, atraumatic. TMs and ear canals: Normal. Sinuses: No TTP Throat: Symmetric, nontender Teeth: Not tender to percussion. TMJ: No click, slightly TTP on  the left side. Skin: Not pale. Not jaundice Neurologic:  alert & oriented X3.  Speech normal, gait appropriate for age and unassisted Psych--  Cognition and judgment appear intact.  Cooperative with normal attention span and concentration.  Behavior appropriate. No anxious or depressed appearing.      Assessment     ASSESSMENT New patient 06-2020, moved from [place]. OSA , dx ~2010, +CPAP GERD Colon Polyps H/o Diverticulitis  H/o Depression Allergies  Sinus issues: Surgery 2012, surgery 06/2020  PLAN Left otalgia: Started 2 to 3 days ago, self resolving.  Exam is benign except for slightly tenderness at the left TMJ, no evidence of infection. Plan: Recommend Tylenol, okay to put an ice pack on top of TMJ and observation.  If he is not better he will let me know     This visit occurred during the SARS-CoV-2 public health emergency.   Safety protocols were in place, including screening questions prior to the visit, additional usage of staff PPE, and extensive cleaning of exam room while observing appropriate contact time as indicated for disinfecting solutions.

## 2020-12-04 NOTE — Assessment & Plan Note (Signed)
Left otalgia: Started 2 to 3 days ago, self resolving.  Exam is benign except for slightly tenderness at the left TMJ, no evidence of infection. Plan: Recommend Tylenol, okay to put an ice pack on top of TMJ and observation.  If he is not better he will let me know

## 2020-12-12 ENCOUNTER — Telehealth: Payer: Self-pay | Admitting: Pulmonary Disease

## 2020-12-14 NOTE — Telephone Encounter (Signed)
Called Brad at [facility].  He checked and supplies were shipped on 12/13 & they use 5 day shipping.  He states if supplies are not received in a couple of days to call him or pt can cal the customer service #.  I called pt & made him aware - gave him the phone # to customer service.  Nothing further needed.

## 2020-12-27 DIAGNOSIS — G4733 Obstructive sleep apnea (adult) (pediatric): Secondary | ICD-10-CM | POA: Diagnosis not present

## 2021-01-10 ENCOUNTER — Other Ambulatory Visit: Payer: Self-pay

## 2021-01-10 ENCOUNTER — Ambulatory Visit (INDEPENDENT_AMBULATORY_CARE_PROVIDER_SITE_OTHER): Payer: BC Managed Care – PPO | Admitting: Internal Medicine

## 2021-01-10 VITALS — BP 124/80 | HR 70 | Temp 98.3°F | Ht 68.0 in | Wt 241.0 lb

## 2021-01-10 DIAGNOSIS — M25561 Pain in right knee: Secondary | ICD-10-CM | POA: Diagnosis not present

## 2021-01-10 NOTE — Progress Notes (Signed)
Subjective:    Patient ID: Scott Phillips, male    DOB: 16-Oct-1971, 50 y.o.   MRN: [id number]  DOS:  01/10/2021 Type of visit - description: Acute Around Christmas, he twisted his right knee and immediately developed anterior pain. There was no swelling, redness.  There was no direct impact to the knee.  Since then, has used a heating pad and OTC NSAIDs but the pain is not improving. Typically increases when he goes up or down the stairs or when he pivots on his knee.  As long as he walks straight in the flat surface pain is not triggered    Review of Systems See above   Past Medical History:  Diagnosis Date  . Allergic rhinosinusitis   . Allergy   . Arthritis   . Colon polyps   . Depression   . Diverticulitis   . GERD (gastroesophageal reflux disease)   . Sinusitis   . Sleep apnea     Past Surgical History:  Procedure Laterality Date  . KNEE ARTHROSCOPY Right 1991   cyst removal  . MAXILLARY ANTROSTOMY Right 06/29/2020   Procedure: RIGHT MAXILLARY ANTROSTOMY WITH TISSUE REMOVAL;  Surgeon: Leta Baptist, MD;  Location: [facility];  Service: ENT;  Laterality: Right;  . NASAL SINUS SURGERY     x3 in [place]  . SUBACROMIAL DECOMPRESSION Left   . TRIGGER FINGER RELEASE Right 2018   R 3rd  . VASECTOMY  2019    Allergies as of 01/10/2021      Reactions   Milk-related Compounds       Medication List       Accurate as of January 10, 2021 11:12 AM. If you have any questions, ask your nurse or doctor.        azelastine 0.1 % nasal spray Commonly known as: ASTELIN 1-2 sprays each nostril twice daily as needed   b complex vitamins tablet Take 1 tablet by mouth 3 (three) times a week.   CALCIUM-MAGNESUIUM-ZINC PO Take by mouth 2 (two) times daily.   Carbinoxamine Maleate 4 MG Tabs Take 1 tablet (4 mg total) by mouth every 6 (six) hours as needed.   famotidine 20 MG tablet Commonly known as: PEPCID Take 1 tablet (20 mg total) by mouth 2 (two)  times daily before a meal.   FIBER CHOICE PO Take by mouth 5 (five) times daily.   fluticasone 50 MCG/ACT nasal spray Commonly known as: FLONASE Place 2 sprays into both nostrils daily.   Xhance 93 MCG/ACT Exhu Generic drug: Fluticasone Propionate Place 2 puffs into the nose 2 (two) times daily as needed.   MULTIVITAMIN ADULT PO Take by mouth.   Olopatadine HCl 0.2 % Soln Commonly known as: Pataday Place 1 drop into both eyes daily as needed.   pantoprazole 40 MG tablet Commonly known as: PROTONIX Take 1 tablet (40 mg total) by mouth 2 (two) times daily before a meal.   Probiotic-10 Chew Chew by mouth.   Turmeric Curcumin Caps Take 1,000 each by mouth.          Objective:   Physical Exam BP 124/80 (BP Location: Right Arm, Patient Position: Sitting, Cuff Size: Large)   Pulse 70   Temp 98.3 F (36.8 C) (Oral)   Ht 5\' 8"  (1.727 m)   Wt 241 lb (109.3 kg)   SpO2 98%   BMI 36.64 kg/m  General:   Well developed, NAD, BMI noted. HEENT:  Normocephalic . Face symmetric, atraumatic MSK: Left  knee: Normal Right knee: Well-healed surgical scar No effusion no redness Varo or  valgus lateralization negative McMurray's negative, drawer sign negative I asked him to ambulate and he developed pain slightly distal from the patella Skin: Not pale. Not jaundice Neurologic:  alert & oriented X3.  Speech normal, gait appropriate for age and unassisted Psych--  Cognition and judgment appear intact.  Cooperative with normal attention span and concentration.  Behavior appropriate. No anxious or depressed appearing.      Assessment      ASSESSMENT New patient 06-2020, moved from [place]. OSA , dx ~2010, +CPAP GERD Colon Polyps H/o Diverticulitis  H/o Depression Allergies  Sinus issues: Surgery 2012, surgery 06/2020  PLAN Right knee pain: Developed few days ago, not responding to conservative treatment, history of removal of the cyst under the patella years  ago. Exam is essentially benign, suspect persistent patellar tendinitis, refer to sports medicine, for now switch heat to ice and use NSAIDs.  GI precautions discussed.  This visit occurred during the SARS-CoV-2 public health emergency.  Safety protocols were in place, including screening questions prior to the visit, additional usage of staff PPE, and extensive cleaning of exam room while observing appropriate contact time as indicated for disinfecting solutions.

## 2021-01-10 NOTE — Assessment & Plan Note (Signed)
Right knee pain: Developed few days ago, not responding to conservative treatment, history of removal of the cyst under the patella years ago. Exam is essentially benign, suspect persistent patellar tendinitis, refer to sports medicine, for now switch heat to ice and use NSAIDs.  GI precautions discussed.

## 2021-01-10 NOTE — Patient Instructions (Signed)
IBUPROFEN (Advil or Motrin) 200 mg 2 tablets every 12 hours as needed for pain.  Always take it with food because may cause gastritis and ulcers.  If you notice nausea, stomach pain, change in the color of stools --->  Stop the medicine and let us know  ICE the knee twice a day

## 2021-01-16 ENCOUNTER — Ambulatory Visit: Payer: Self-pay

## 2021-01-16 ENCOUNTER — Other Ambulatory Visit: Payer: Self-pay

## 2021-01-16 ENCOUNTER — Ambulatory Visit (INDEPENDENT_AMBULATORY_CARE_PROVIDER_SITE_OTHER): Payer: BC Managed Care – PPO | Admitting: Family Medicine

## 2021-01-16 VITALS — BP 100/74 | Ht 68.0 in | Wt 240.0 lb

## 2021-01-16 DIAGNOSIS — M222X1 Patellofemoral disorders, right knee: Secondary | ICD-10-CM | POA: Insufficient documentation

## 2021-01-16 DIAGNOSIS — M25561 Pain in right knee: Secondary | ICD-10-CM

## 2021-01-16 NOTE — Progress Notes (Signed)
Scott Phillips - 50 y.o. male MRN [id number]  Date of birth: 1971/05/04  SUBJECTIVE:  Including CC & ROS.  No chief complaint on file.   Scott Phillips is a 50 y.o. male that is presenting with acute knee pain.  Injury has been ongoing for about 4 weeks.  Having anterior knee pain.  Worse with going up and down stairs.  Has a previous history of surgery in the 1990s.  Has been using ibuprofen and ice with improvement.  Pain does return..   Review of Systems See HPI   HISTORY: Past Medical, Surgical, Social, and Family History Reviewed & Updated per EMR.   Pertinent Historical Findings include:  Past Medical History:  Diagnosis Date  . Allergic rhinosinusitis   . Allergy   . Arthritis   . Colon polyps   . Depression   . Diverticulitis   . GERD (gastroesophageal reflux disease)   . Sinusitis   . Sleep apnea     Past Surgical History:  Procedure Laterality Date  . KNEE ARTHROSCOPY Right 1991   cyst removal  . MAXILLARY ANTROSTOMY Right 06/29/2020   Procedure: RIGHT MAXILLARY ANTROSTOMY WITH TISSUE REMOVAL;  Surgeon: Leta Baptist, MD;  Location: [facility];  Service: ENT;  Laterality: Right;  . NASAL SINUS SURGERY     x3 in [place] Scott Phillips  . SUBACROMIAL DECOMPRESSION Left   . TRIGGER FINGER RELEASE Right 2018   R 3rd  . VASECTOMY  2019    Family History  Problem Relation Age of Onset  . Cancer Mother   . Depression Mother   . Hypertension Mother   . Miscarriages / [place] Mother   . Cancer Father   . Alcohol abuse Father   . Arthritis Father   . Diabetes Father   . Hearing loss Father   . Allergic rhinitis Sister   . Depression Maternal Grandmother   . Hearing loss Maternal Grandfather   . Asthma Maternal Grandfather   . Diabetes Paternal Grandfather   . Colon cancer Neg Hx   . Prostate cancer Neg Hx     Social History   Socioeconomic History  . Marital status: Married    Spouse name: Not on file  . Number of children: 1  . Years of  education: Not on file  . Highest education level: Not on file  Occupational History  . 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 Smoking status: Former Smoker    Packs/day: 2.00    Types: Cigarettes    Start date: 12/30/1991    Quit date: 12/29/2014    Years since quitting: 6.0  . Smokeless tobacco: Never Used  Vaping Use  . Vaping Use: Never used  Substance and Sexual Activity  . Alcohol use: Yes    Comment: occas  . Drug use: Never  . Sexual activity: Not on file  Other Topics Concern  . Not on file  Social History Narrative   Daughter born  2018   Social Determinants of Health   Financial Resource Strain: Not on file  Food Insecurity: Not on file  Transportation Needs: Not on file  Physical Activity: Not on file  Stress: Not on file  Social Connections: Not on file  Intimate Partner Violence: Not on file     PHYSICAL EXAM:  VS: BP 100/74   Ht 5\' 8"  (1.727 m)   Wt 240 lb (108.9 kg)   BMI 36.49 kg/m  Physical Exam Gen: NAD, alert,  cooperative with exam, well-appearing MSK:  Right knee: No effusion. Normal strength resistance. Normal range of motion. No instability. Neurovascular intact   Limited ultrasound: Right knee:  No effusion. Normal-appearing quadricep patellar tendon. Normal-appearing joint space medially and laterally. Increased hyperemia which tends to be tracking to the posterior portion of the patella.  Summary: Findings are suggestive of irritation undersurface of the patella.  Ultrasound and interpretation by Clearance Coots, MD   ASSESSMENT & PLAN:   Patellofemoral pain syndrome of right knee Ultrasound was reassuring.  Does seem to have irritation of the patella.  No effusion on exam. -Counseled on home exercise therapy and supportive care. -Prednisone. -Reaction brace. -Could consider imaging with physical therapy or injection.

## 2021-01-16 NOTE — Patient Instructions (Signed)
Nice to meet you Please continue ice  Please try the exercises   Please send me a message in MyChart with any questions or updates.  Please see me back in 4 weeks.   --Dr. Raeford Razor

## 2021-01-16 NOTE — Assessment & Plan Note (Signed)
Ultrasound was reassuring.  Does seem to have irritation of the patella.  No effusion on exam. -Counseled on home exercise therapy and supportive care. -Prednisone. -Reaction brace. -Could consider imaging with physical therapy or injection.

## 2021-01-22 ENCOUNTER — Encounter: Payer: Self-pay | Admitting: Internal Medicine

## 2021-01-22 DIAGNOSIS — G5601 Carpal tunnel syndrome, right upper limb: Secondary | ICD-10-CM

## 2021-02-11 ENCOUNTER — Encounter: Payer: Self-pay | Admitting: Family Medicine

## 2021-02-12 DIAGNOSIS — G5601 Carpal tunnel syndrome, right upper limb: Secondary | ICD-10-CM | POA: Diagnosis not present

## 2021-02-14 ENCOUNTER — Ambulatory Visit: Payer: BC Managed Care – PPO | Admitting: Family Medicine

## 2021-02-19 ENCOUNTER — Ambulatory Visit: Payer: BC Managed Care – PPO | Admitting: Family Medicine

## 2021-02-28 ENCOUNTER — Ambulatory Visit (HOSPITAL_BASED_OUTPATIENT_CLINIC_OR_DEPARTMENT_OTHER)
Admission: RE | Admit: 2021-02-28 | Discharge: 2021-02-28 | Disposition: A | Discharge status: Home / Self Care | Payer: BC Managed Care – PPO | Source: Ambulatory Visit | Attending: Family Medicine | Admitting: Family Medicine

## 2021-02-28 ENCOUNTER — Encounter: Payer: Self-pay | Admitting: Family Medicine

## 2021-02-28 ENCOUNTER — Other Ambulatory Visit: Payer: Self-pay

## 2021-02-28 ENCOUNTER — Ambulatory Visit (INDEPENDENT_AMBULATORY_CARE_PROVIDER_SITE_OTHER): Payer: BC Managed Care – PPO | Admitting: Family Medicine

## 2021-02-28 DIAGNOSIS — M222X1 Patellofemoral disorders, right knee: Secondary | ICD-10-CM

## 2021-02-28 DIAGNOSIS — M25561 Pain in right knee: Secondary | ICD-10-CM | POA: Diagnosis not present

## 2021-02-28 MED ORDER — PENNSAID 2 % EX SOLN: 1.0000 "application " | Freq: Two times a day (BID) | CUTANEOUS | 2 refills | Status: DC

## 2021-02-28 NOTE — Patient Instructions (Signed)
Good to see you Please try ice as needed  Please continue the exercises  I will call with the results from today   Please send me a message in MyChart with any questions or updates.  Please see me back in 4 weeks.   --Dr. Raeford Razor

## 2021-02-28 NOTE — Progress Notes (Signed)
Scott Phillips - 50 y.o. male MRN [id number]  Date of birth: 05-26-1971  SUBJECTIVE:  Including CC & ROS.  No chief complaint on file.   Scott Phillips is a 50 y.o. male that is following up for his knee pain.  He was doing well up until recently.  He turned his knee and felt the pain returned.  He has been doing well with the exercises and the brace.   Review of Systems See HPI   HISTORY: Past Medical, Surgical, Social, and Family History Reviewed & Updated per EMR.   Pertinent Historical Findings include:  Past Medical History:  Diagnosis Date  . Allergic rhinosinusitis   . Allergy   . Arthritis   . Colon polyps   . Depression   . Diverticulitis   . GERD (gastroesophageal reflux disease)   . Sinusitis   . Sleep apnea     Past Surgical History:  Procedure Laterality Date  . KNEE ARTHROSCOPY Right 1991   cyst removal  . MAXILLARY ANTROSTOMY Right 06/29/2020   Procedure: RIGHT MAXILLARY ANTROSTOMY WITH TISSUE REMOVAL;  Surgeon: Leta Baptist, MD;  Location: [place];  Service: ENT;  Laterality: Right;  . NASAL SINUS SURGERY     x3 in [place]  . SUBACROMIAL DECOMPRESSION Left   . TRIGGER FINGER RELEASE Right 2018   R 3rd  . VASECTOMY  2019    Family History  Problem Relation Age of Onset  . Cancer Mother   . Depression Mother   . Hypertension Mother   . Miscarriages / [place] Mother   . Cancer Father   . Alcohol abuse Father   . Arthritis Father   . Diabetes Father   . Hearing loss Father   . Allergic rhinitis Sister   . Depression Maternal Grandmother   . Hearing loss Maternal Grandfather   . Asthma Maternal Grandfather   . Diabetes Paternal Grandfather   . Colon cancer Neg Hx   . Prostate cancer Neg Hx     Social History   Socioeconomic History  . Marital status: Married    Spouse name: Not on file  . Number of children: 1  . Years of education: Not on file  . Highest education level: Not on file  Occupational History  .  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 Smoking status: Former Smoker    Packs/day: 2.00    Types: Cigarettes    Start date: 12/30/1991    Quit date: 12/29/2014    Years since quitting: 6.1  . Smokeless tobacco: Never Used  Vaping Use  . Vaping Use: Never used  Substance and Sexual Activity  . Alcohol use: Yes    Comment: occas  . Drug use: Never  . Sexual activity: Not on file  Other Topics Concern  . Not on file  Social History Narrative   Daughter born  2018   Social Determinants of Health   Financial Resource Strain: Not on file  Food Insecurity: Not on file  Transportation Needs: Not on file  Physical Activity: Not on file  Stress: Not on file  Social Connections: Not on file  Intimate Partner Violence: Not on file     PHYSICAL EXAM:  VS: BP 112/84 (BP Location: Left Arm, Patient Position: Sitting, Cuff Size: Large)   Ht 5\' 8"  (1.727 m)   Wt 240 lb (108.9 kg)   BMI 36.49 kg/m  Physical Exam Gen: NAD, alert, cooperative with exam, well-appearing  ASSESSMENT & PLAN:   Patellofemoral pain syndrome of right knee Minor pain today.  Was pain-free up until recently.  Seems most likely still associated with tracking. -Counseled home exercise therapy and supportive care. -Pennsaid and samples provided. -Continue brace. -Could consider further imaging if needed.

## 2021-02-28 NOTE — Assessment & Plan Note (Addendum)
Minor pain today.  Was pain-free up until recently.  Seems most likely still associated with tracking. -Counseled home exercise therapy and supportive care. -Pennsaid and samples provided. -Continue brace. -Could consider further imaging if needed.

## 2021-02-28 NOTE — Progress Notes (Signed)
Medication Samples have been provided to the patient.  Drug name: Pennsaid    Strength: 2%       Qty: 2 boxes LOT: [id number]  Exp.Date: 04/2021  Dosing instructions: Use pea sized amount on affected area twice daily.   The patient has been instructed regarding the correct time, dose, and frequency of taking this medication, including desired effects and most common side effects.   Scott Phillips 4:38 PM 02/28/2021

## 2021-03-04 ENCOUNTER — Telehealth: Payer: Self-pay | Admitting: Family Medicine

## 2021-03-04 NOTE — Telephone Encounter (Signed)
Left VM for patient. If he calls back please have him speak with a nurse/CMA and inform that his xrays look good.   If any questions then please take the best time and phone number to call and I will try to call him back.   Rosemarie Ax, MD [facility] 03/04/2021, 8:29 AM

## 2021-04-02 DIAGNOSIS — G5601 Carpal tunnel syndrome, right upper limb: Secondary | ICD-10-CM | POA: Diagnosis not present

## 2021-04-02 DIAGNOSIS — J320 Chronic maxillary sinusitis: Secondary | ICD-10-CM | POA: Diagnosis not present

## 2021-04-02 DIAGNOSIS — J310 Chronic rhinitis: Secondary | ICD-10-CM | POA: Diagnosis not present

## 2021-04-08 DIAGNOSIS — G5601 Carpal tunnel syndrome, right upper limb: Secondary | ICD-10-CM | POA: Diagnosis not present

## 2021-04-15 ENCOUNTER — Other Ambulatory Visit: Payer: Self-pay

## 2021-04-15 ENCOUNTER — Ambulatory Visit (INDEPENDENT_AMBULATORY_CARE_PROVIDER_SITE_OTHER): Payer: BC Managed Care – PPO | Admitting: Internal Medicine

## 2021-04-15 VITALS — BP 127/71 | HR 59 | Temp 98.2°F | Resp 16 | Ht 68.0 in | Wt 228.0 lb

## 2021-04-15 DIAGNOSIS — K219 Gastro-esophageal reflux disease without esophagitis: Secondary | ICD-10-CM

## 2021-04-15 DIAGNOSIS — W57XXXA Bitten or stung by nonvenomous insect and other nonvenomous arthropods, initial encounter: Secondary | ICD-10-CM

## 2021-04-15 DIAGNOSIS — G4733 Obstructive sleep apnea (adult) (pediatric): Secondary | ICD-10-CM | POA: Diagnosis not present

## 2021-04-15 NOTE — Progress Notes (Signed)
Subjective:    Patient ID: Scott Phillips, male    DOB: 04-Dec-1971, 50 y.o.   MRN: [id number]  DOS:  04/15/2021 Type of visit - description:   20-month follow-up  Doing well. Diet and exercise have improved. GERD: Currently well controlled with no nausea, vomiting.  No dysphagia. Good CPAP compliance.   Review of Systems See above   Past Medical History:  Diagnosis Date  . Allergic rhinosinusitis   . Allergy   . Arthritis   . Colon polyps   . Depression   . Diverticulitis   . GERD (gastroesophageal reflux disease)   . Sinusitis   . Sleep apnea     Past Surgical History:  Procedure Laterality Date  . KNEE ARTHROSCOPY Right 1991   cyst removal  . MAXILLARY ANTROSTOMY Right 06/29/2020   Procedure: RIGHT MAXILLARY ANTROSTOMY WITH TISSUE REMOVAL;  Surgeon: Leta Baptist, MD;  Location: [place];  Service: ENT;  Laterality: Right;  . NASAL SINUS SURGERY     x3 in [place]  . SUBACROMIAL DECOMPRESSION Left   . TRIGGER FINGER RELEASE Right 2018   R 3rd  . VASECTOMY  2019    Allergies as of 04/15/2021      Reactions   Milk-related Compounds       Medication List       Accurate as of April 15, 2021  4:05 PM. If you have any questions, ask your nurse or doctor.        b complex vitamins tablet Take 1 tablet by mouth 3 (three) times a week.   CALCIUM-MAGNESUIUM-ZINC PO Take by mouth 2 (two) times daily.   Carbinoxamine Maleate 4 MG Tabs Take 1 tablet (4 mg total) by mouth every 6 (six) hours as needed.   famotidine 20 MG tablet Commonly known as: PEPCID Take 1 tablet (20 mg total) by mouth 2 (two) times daily before a meal.   FIBER CHOICE PO Take by mouth 5 (five) times daily.   MULTIVITAMIN ADULT PO Take by mouth.   pantoprazole 40 MG tablet Commonly known as: PROTONIX Take 1 tablet (40 mg total) by mouth 2 (two) times daily before a meal.   Pennsaid 2 % Soln Generic drug: Diclofenac Sodium Place 1 application onto the skin 2 (two)  times daily.   Probiotic-10 Chew Chew by mouth.   Turmeric Curcumin Caps Take 1,000 each by mouth.   Xhance 93 MCG/ACT Exhu Generic drug: Fluticasone Propionate Place 2 puffs into the nose 2 (two) times daily as needed.          Objective:   Physical Exam BP 127/71 (BP Location: Right Arm, Patient Position: Sitting, Cuff Size: Large)   Pulse (!) 59   Temp 98.2 F (36.8 C) (Oral)   Resp 16   Ht 5\' 8"  (1.727 m)   Wt 228 lb (103.4 kg)   SpO2 100%   BMI 34.67 kg/m  General:   Well developed, NAD, BMI noted.  HEENT:  Normocephalic . Face symmetric, atraumatic Lungs:  CTA B Normal respiratory effort, no intercostal retractions, no accessory muscle use. Heart: RRR,  no murmur.  Abdomen:  Not distended, soft, non-tender. No rebound or rigidity.   Skin: Not pale. Not jaundice Lower extremities: no pretibial edema bilaterally  Neurologic:  alert & oriented X3.  Speech normal, gait appropriate for age and unassisted Psych--  Cognition and judgment appear intact.  Cooperative with normal attention span and concentration.  Behavior appropriate. No anxious or depressed appearing.  Assessment     ASSESSMENT New patient 06-2020, moved from [place]. OSA , dx ~2010, +CPAP GERD Colon Polyps H/o Diverticulitis  H/o Depression Allergies  Sinus issues: Surgery 2012, surgery 06/2020  PLAN OSA: Saw Dr. Ander Slade, reports good CPAP compliance GERD: On PPIs and H2 blocker, we talk about good diet habits to prevent reflux, he plans to decrease H2 blockers, I agree. Tick bite: Reports a tick bite a couple of weeks ago, he removed it within minutes, the next day felt slightly tired but denies fever chills myalgias, arthralgias or a target rash.  He is somewhat concerned about Lyme disease, with talk about red flag symptoms and he will let me know if he has any suggestion of Lyme disease Right knee pain: Slowly improving Allergic rhinitis: Currently controlled CTS: To have  surgery in 2 weeks RTC 09-2019 to see   Time spent 20 minutes, reviewing the chart and discussing a tick bite  This visit occurred during the SARS-CoV-2 public health emergency.  Safety protocols were in place, including screening questions prior to the visit, additional usage of staff PPE, and extensive cleaning of exam room while observing appropriate contact time as indicated for disinfecting solutions.

## 2021-04-15 NOTE — Assessment & Plan Note (Signed)
OSA: Saw Dr. Ander Slade, reports good CPAP compliance GERD: On PPIs and H2 blocker, we talk about good diet habits to prevent reflux, he plans to decrease H2 blockers, I agree. Tick bite: Reports a tick bite a couple of weeks ago, he removed it within minutes, the next day felt slightly tired but denies fever chills myalgias, arthralgias or a target rash.  He is somewhat concerned about Lyme disease, with talk about red flag symptoms and he will let me know if he has any suggestion of Lyme disease Right knee pain: Slowly improving Allergic rhinitis: Currently controlled CTS: To have surgery in 2 weeks RTC 09-2019 to see

## 2021-04-15 NOTE — Patient Instructions (Addendum)
GO TO THE FRONT DESK, PLEASE SCHEDULE YOUR APPOINTMENTS Come back for   A physical exam by 09-2021    Food Choices for Gastroesophageal Reflux Disease, Adult When you have gastroesophageal reflux disease (GERD), the foods you eat and your eating habits are very important. Choosing the right foods can help ease the discomfort of GERD. Consider working with a dietitian to help you make healthy food choices. What are tips for following this plan? Reading food labels  Look for foods that are low in saturated fat. Foods that have less than 5% of daily value (DV) of fat and 0 g of trans fats may help with your symptoms. Cooking  Cook foods using methods other than frying. This may include baking, steaming, grilling, or broiling. These are all methods that do not need a lot of fat for cooking.  To add flavor, try to use herbs that are low in spice and acidity. Meal planning  Choose healthy foods that are low in fat, such as fruits, vegetables, whole grains, low-fat dairy products, lean meats, fish, and poultry.  Eat frequent, small meals instead of three large meals each day. Eat your meals slowly, in a relaxed setting. Avoid bending over or lying down until 2-3 hours after eating.  Limit high-fat foods such as fatty meats or fried foods.  Limit your intake of fatty foods, such as oils, butter, and shortening.  Avoid the following as told by your health care provider: ? Foods that cause symptoms. These may be different for different people. Keep a food diary to keep track of foods that cause symptoms. ? Alcohol. ? Drinking large amounts of liquid with meals. ? Eating meals during the 2-3 hours before bed.   Lifestyle  Maintain a healthy weight. Ask your health care provider what weight is healthy for you. If you need to lose weight, work with your health care provider to do so safely.  Exercise for at least 30 minutes on 5 or more days each week, or as told by your health care  provider.  Avoid wearing clothes that fit tightly around your waist and chest.  Do not use any products that contain nicotine or tobacco. These products include cigarettes, chewing tobacco, and vaping devices, such as e-cigarettes. If you need help quitting, ask your health care provider.  Sleep with the head of your bed raised. Use a wedge under the mattress or blocks under the bed frame to raise the head of the bed.  Chew sugar-free gum after mealtimes. What foods should I eat? Eat a healthy, well-balanced diet of fruits, vegetables, whole grains, low-fat dairy products, lean meats, fish, and poultry. Each person is different. Foods that may trigger symptoms in one person may not trigger any symptoms in another person. Work with your health care provider to identify foods that are safe for you. The items listed above may not be a complete list of recommended foods and beverages. Contact a dietitian for more information.   What foods should I avoid? Limiting some of these foods may help manage the symptoms of GERD. Everyone is different. Consult a dietitian or your health care provider to help you identify the exact foods to avoid, if any. Fruits Any fruits prepared with added fat. Any fruits that cause symptoms. For some people this may include citrus fruits, such as oranges, grapefruit, pineapple, and lemons. Vegetables Deep-fried vegetables. [place] fries. Any vegetables prepared with added fat. Any vegetables that cause symptoms. For some people, this may include tomatoes and  tomato products, chili peppers, onions and garlic, and horseradish. Grains Pastries or quick breads with added fat. Meats and other proteins High-fat meats, such as fatty beef or pork, hot dogs, ribs, ham, sausage, salami, and bacon. Fried meat or protein, including fried fish and fried chicken. Nuts and nut butters, in large amounts. Dairy Whole milk and chocolate milk. Sour cream. Cream. Ice cream. Cream cheese.  Milkshakes. Fats and oils Butter. Margarine. Shortening. Ghee. Beverages Coffee and tea, with or without caffeine. Carbonated beverages. Sodas. Energy drinks. Fruit juice made with acidic fruits, such as orange or grapefruit. Tomato juice. Alcoholic drinks. Sweets and desserts Chocolate and cocoa. Donuts. Seasonings and condiments Pepper. Peppermint and spearmint. Added salt. Any condiments, herbs, or seasonings that cause symptoms. For some people, this may include curry, hot sauce, or vinegar-based salad dressings. The items listed above may not be a complete list of foods and beverages to avoid. Contact a dietitian for more information. Questions to ask your health care provider Diet and lifestyle changes are usually the first steps that are taken to manage symptoms of GERD. If diet and lifestyle changes do not improve your symptoms, talk with your health care provider about taking medicines. Where to find more information  International Foundation for Gastrointestinal Disorders: aboutgerd.org Summary  When you have gastroesophageal reflux disease (GERD), food and lifestyle choices may be very helpful in easing the discomfort of GERD.  Eat frequent, small meals instead of three large meals each day. Eat your meals slowly, in a relaxed setting. Avoid bending over or lying down until 2-3 hours after eating.  Limit high-fat foods such as fatty meats or fried foods. This information is not intended to replace advice given to you by your health care provider. Make sure you discuss any questions you have with your health care provider. Document Revised: 06/25/2020 Document Reviewed: 06/25/2020 Elsevier Patient Education  [place].

## 2021-05-07 DIAGNOSIS — J310 Chronic rhinitis: Secondary | ICD-10-CM | POA: Diagnosis not present

## 2021-05-07 DIAGNOSIS — J343 Hypertrophy of nasal turbinates: Secondary | ICD-10-CM | POA: Diagnosis not present

## 2021-05-07 DIAGNOSIS — J0101 Acute recurrent maxillary sinusitis: Secondary | ICD-10-CM | POA: Diagnosis not present

## 2021-05-24 DIAGNOSIS — G5601 Carpal tunnel syndrome, right upper limb: Secondary | ICD-10-CM | POA: Diagnosis not present

## 2021-05-30 DIAGNOSIS — J343 Hypertrophy of nasal turbinates: Secondary | ICD-10-CM | POA: Diagnosis not present

## 2021-05-30 DIAGNOSIS — J310 Chronic rhinitis: Secondary | ICD-10-CM | POA: Diagnosis not present

## 2021-05-30 DIAGNOSIS — J0101 Acute recurrent maxillary sinusitis: Secondary | ICD-10-CM | POA: Diagnosis not present

## 2021-06-07 DIAGNOSIS — M79641 Pain in right hand: Secondary | ICD-10-CM | POA: Diagnosis not present

## 2021-06-20 IMAGING — DX DG KNEE COMPLETE 4+V*R*
6 series · 6 of 6 positions shown · non-contrast
Comparison: None.

CLINICAL DATA: Right knee pain after twisting injury 12/22/2020.

EXAM:
RIGHT KNEE - COMPLETE 4+ VIEW

[knee ap (1 of 2)]
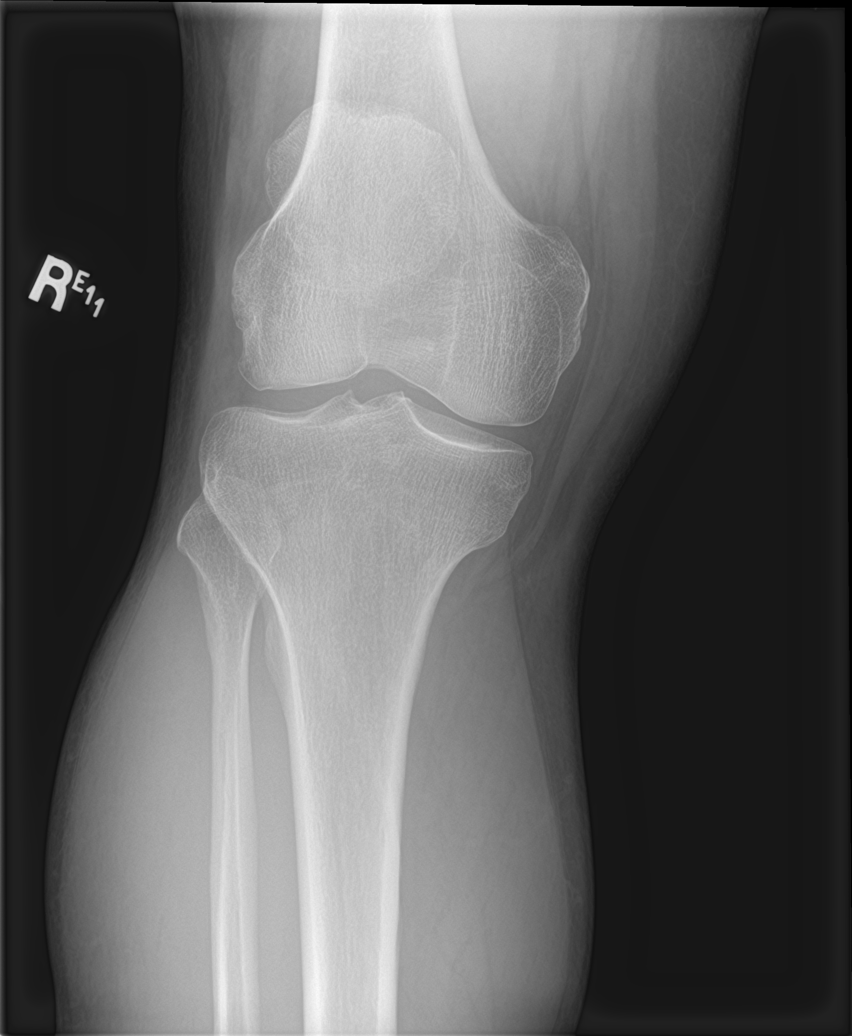

[tunnel]
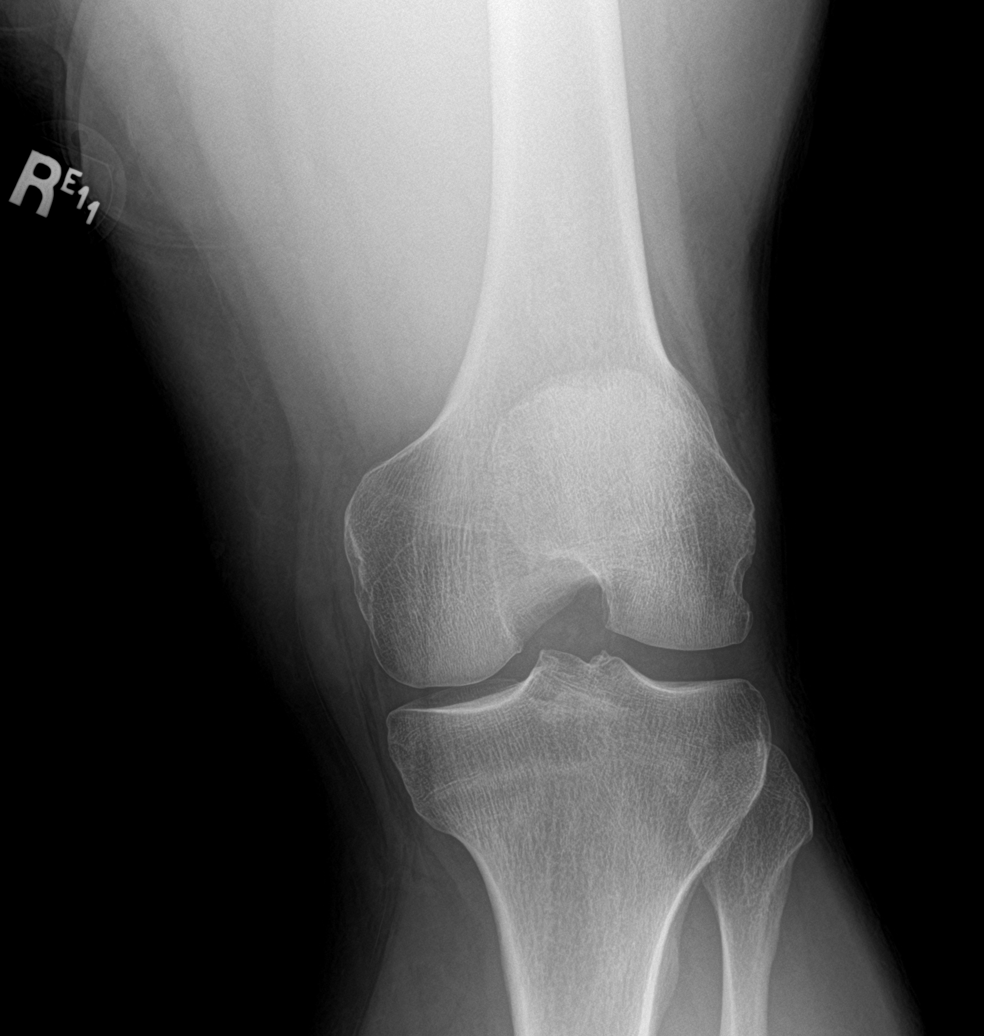

[knee lat]
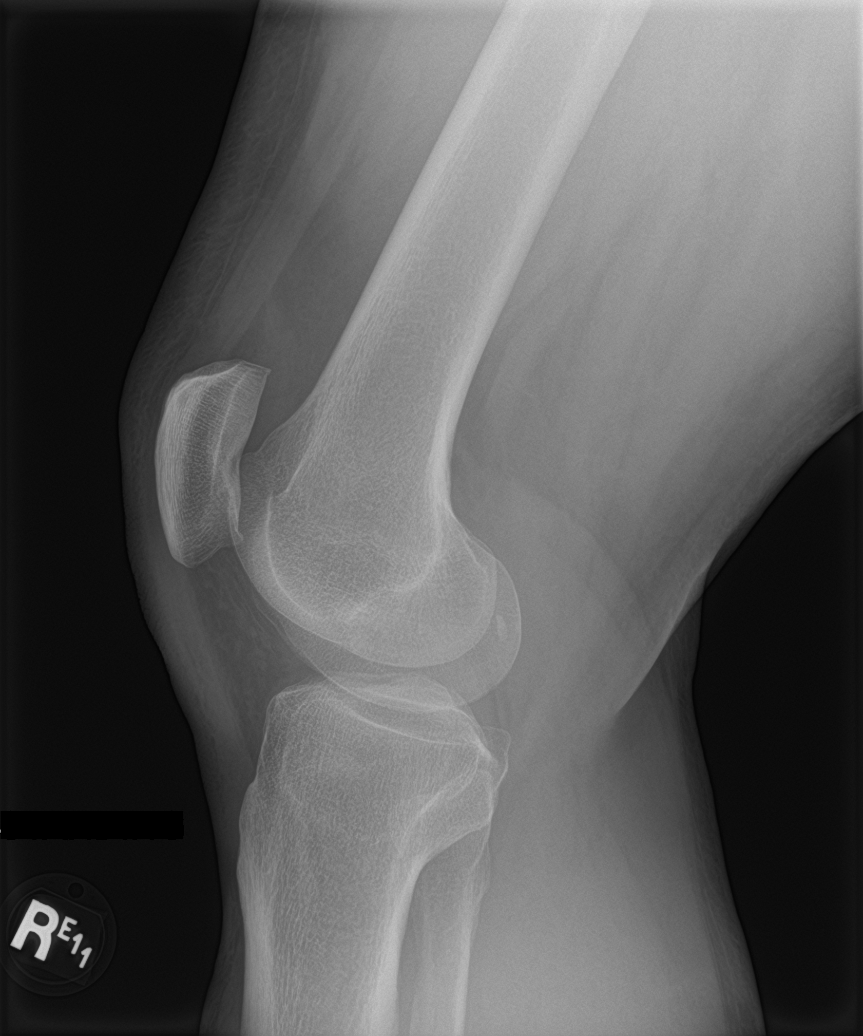

[knee sunrise (1 of 2)]
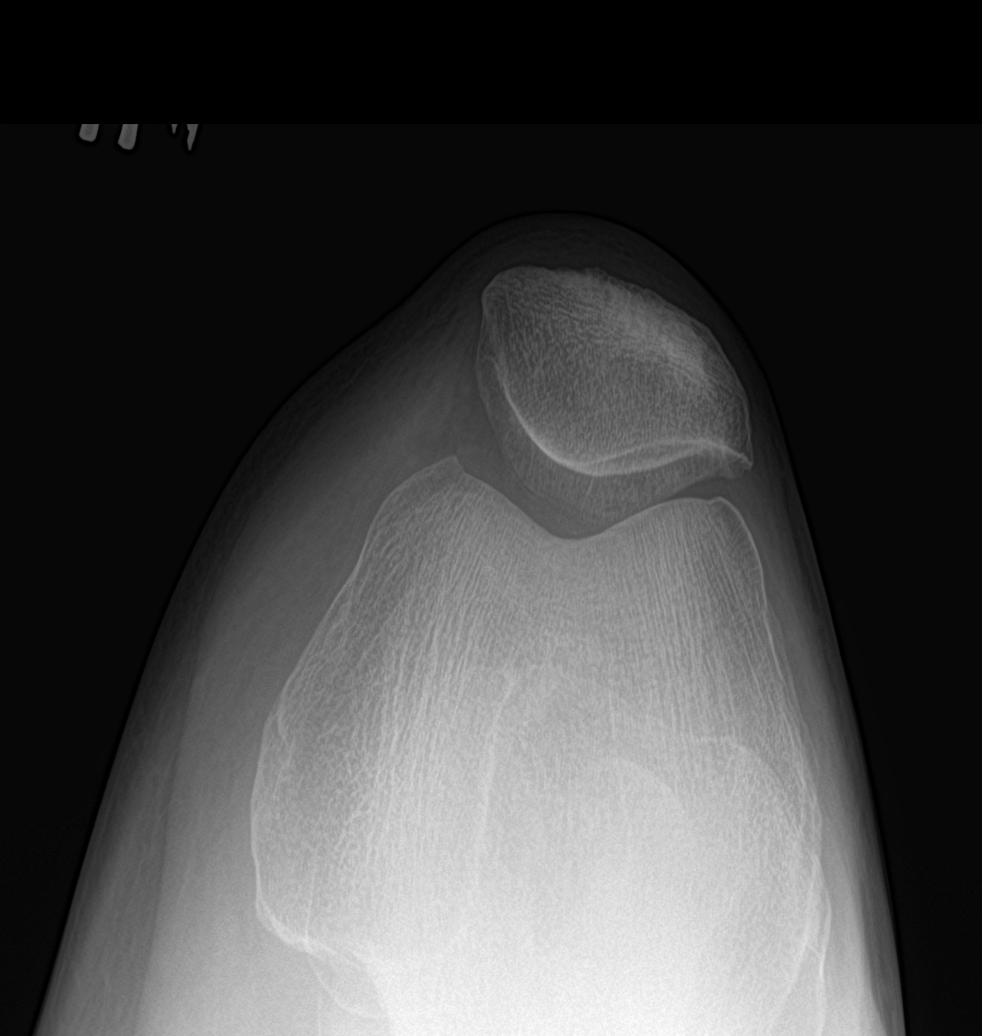

[knee ap (2 of 2)]
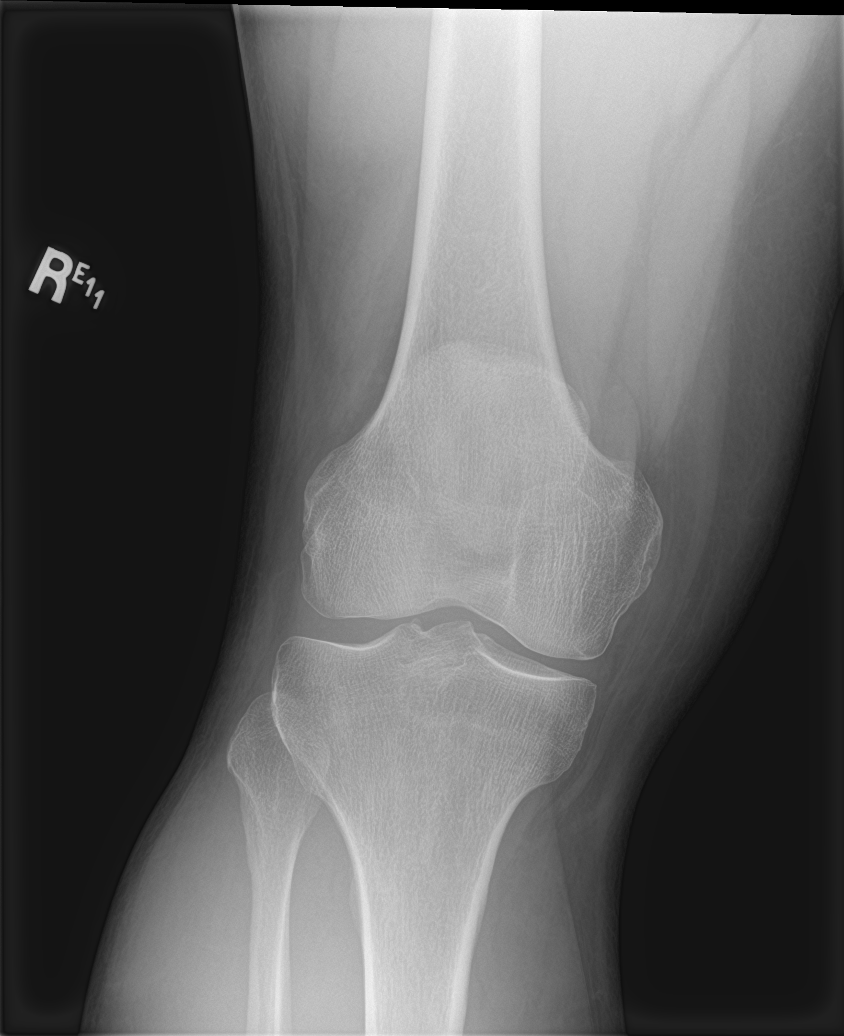

[knee sunrise (2 of 2)]
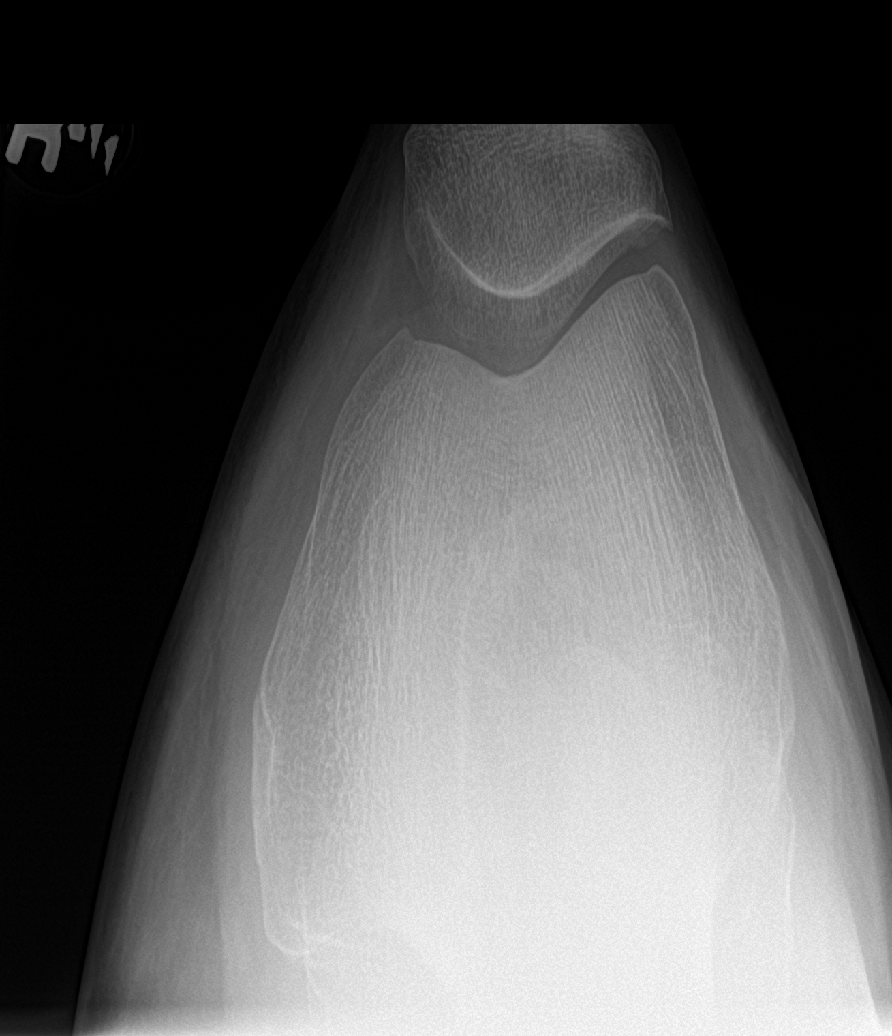

[6 of 6 positions shown; findings below may reference images not displayed]

FINDINGS: AP, tunnel, standing lateral and patellar sunrise views obtained.
Normal joint spaces. Normal alignment. Patella normally situated in
the trochlear groove. Trace patellofemoral spurring. Minimal joint
effusion. No acute or healed fracture. No erosion or bony
destruction. No focal soft tissue abnormality.
IMPRESSION: Minimal patellofemoral spurring and trace joint effusion.

## 2021-07-08 ENCOUNTER — Other Ambulatory Visit: Payer: Self-pay

## 2021-07-08 MED ORDER — PANTOPRAZOLE SODIUM 40 MG PO TBEC: 40.0000 mg | DELAYED_RELEASE_TABLET | Freq: Two times a day (BID) | ORAL | 3 refills | Status: DC

## 2021-08-06 ENCOUNTER — Other Ambulatory Visit: Payer: Self-pay

## 2021-08-24 ENCOUNTER — Emergency Department (HOSPITAL_BASED_OUTPATIENT_CLINIC_OR_DEPARTMENT_OTHER)
Admission: EM | Admit: 2021-08-24 | Discharge: 2021-08-25 | Disposition: A | Discharge status: Home / Self Care | Payer: BC Managed Care – PPO | Attending: Emergency Medicine | Admitting: Emergency Medicine

## 2021-08-24 ENCOUNTER — Encounter (HOSPITAL_BASED_OUTPATIENT_CLINIC_OR_DEPARTMENT_OTHER): Payer: Self-pay | Admitting: *Deleted

## 2021-08-24 ENCOUNTER — Other Ambulatory Visit: Payer: Self-pay

## 2021-08-24 DIAGNOSIS — Z87891 Personal history of nicotine dependence: Secondary | ICD-10-CM | POA: Diagnosis not present

## 2021-08-24 DIAGNOSIS — Y9289 Other specified places as the place of occurrence of the external cause: Secondary | ICD-10-CM | POA: Diagnosis not present

## 2021-08-24 DIAGNOSIS — S01512A Laceration without foreign body of oral cavity, initial encounter: Secondary | ICD-10-CM | POA: Diagnosis not present

## 2021-08-24 DIAGNOSIS — S00502A Unspecified superficial injury of oral cavity, initial encounter: Secondary | ICD-10-CM | POA: Diagnosis not present

## 2021-08-24 DIAGNOSIS — Y9389 Activity, other specified: Secondary | ICD-10-CM | POA: Diagnosis not present

## 2021-08-24 NOTE — ED Triage Notes (Signed)
Pt states he bit his tongue while eating dinner around 1845 this evening. States tongue has been bleeding off and on since then

## 2021-08-25 DIAGNOSIS — S01512A Laceration without foreign body of oral cavity, initial encounter: Secondary | ICD-10-CM | POA: Diagnosis not present

## 2021-08-25 MED ORDER — LIDOCAINE-EPINEPHRINE 2 %-1:100000 IJ SOLN
20.0000 mL | Freq: Once | INTRAMUSCULAR | Status: AC
  Administered 2021-08-25: 1.7 mL

## 2021-08-25 NOTE — ED Provider Notes (Signed)
[place] DEPT [facility] Provider Note: Georgena Spurling, MD, FACEP  CSN: [id number] MRN: [id number] ARRIVAL: 08/24/21 at 2119 ROOM: [place]  Laceration (tongue)   HISTORY OF PRESENT ILLNESS  08/25/21 12:32 AM Scott Phillips is a 50 y.o. male who bit his tongue about 6:45 PM yesterday evening while eating dinner.  His tongue has been bleeding off and on since then.  He rates associated pain is a 1 out of 10.  He has been applying pressure with relief of the bleeding.   Past Medical History:  Diagnosis Date   Allergic rhinosinusitis    Allergy    Arthritis    Colon polyps    Depression    Diverticulitis    GERD (gastroesophageal reflux disease)    Sinusitis    Sleep apnea     Past Surgical History:  Procedure Laterality Date   CARPAL TUNNEL RELEASE     KNEE ARTHROSCOPY Right 1991   cyst removal   MAXILLARY ANTROSTOMY Right 06/29/2020   Procedure: RIGHT MAXILLARY ANTROSTOMY WITH TISSUE REMOVAL;  Surgeon: Leta Baptist, MD;  Location: [place];  Service: ENT;  Laterality: Right;   NASAL SINUS SURGERY     x3 in [place]   SUBACROMIAL DECOMPRESSION Left    TRIGGER FINGER RELEASE Right 2018   R 3rd   VASECTOMY  2019    Family History  Problem Relation Age of Onset   Cancer Mother    Depression Mother    Hypertension Mother    Miscarriages / [place] Mother    Cancer Father    Alcohol abuse Father    Arthritis Father    Diabetes Father    Hearing loss Father    Allergic rhinitis Sister    Depression Maternal Grandmother    Hearing loss Maternal Grandfather    Asthma Maternal Grandfather    Diabetes Paternal Grandfather    Colon cancer Neg Hx    Prostate cancer Neg Hx     Social History   Tobacco Use   Smoking status: Former    Packs/day: 2.00    Types: Cigarettes    Start date: 12/30/1991    Quit date: 12/29/2014    Years since quitting: 6.6   Smokeless tobacco: Never  Vaping Use   Vaping Use: Never used   Substance Use Topics   Alcohol use: Yes    Comment: occas   Drug use: Never    Prior to Admission medications   Medication Sig Start Date End Date Taking? Authorizing Provider  b complex vitamins tablet Take 1 tablet by mouth 3 (three) times a week.    [provider]  Calcium-Magnesium-Zinc (CALCIUM-MAGNESUIUM-ZINC PO) Take by mouth 2 (two) times daily.    [provider]  Carbinoxamine Maleate 4 MG TABS Take 1 tablet (4 mg total) by mouth every 6 (six) hours as needed. 11/15/20   Bobbitt, [place] Muta, MD  Diclofenac Sodium (PENNSAID) 2 % SOLN Place 1 application onto the skin 2 (two) times daily. 02/28/21   Rosemarie Ax, MD  famotidine (PEPCID) 20 MG tablet Take 1 tablet (20 mg total) by mouth 2 (two) times daily before a meal. 07/10/20   Colon Branch, MD  Fluticasone Propionate Truett Perna) 93 MCG/ACT EXHU Place 2 puffs into the nose 2 (two) times daily as needed. 11/15/20   Bobbitt, [place] Muta, MD  Inulin (FIBER CHOICE PO) Take by mouth 5 (five) times daily.    [provider]  Misc Natural  Products (TURMERIC CURCUMIN) CAPS Take 1,000 each by mouth.    [provider]  Multiple Vitamin (MULTIVITAMIN ADULT PO) Take by mouth.    [provider]  pantoprazole (PROTONIX) 40 MG tablet Take 1 tablet (40 mg total) by mouth 2 (two) times daily before a meal. 07/08/21   Colon Branch, MD  Probiotic Product (PROBIOTIC-10) CHEW Chew by mouth.    [provider]    Allergies Milk-related compounds   REVIEW OF SYSTEMS  Negative except as noted here or in the History of Present Illness.   PHYSICAL EXAMINATION  Initial Vital Signs Blood pressure 139/84, pulse 70, temperature 98.6 F (37 C), temperature source Oral, resp. rate 18, height '5\' 8"'$  (1.727 m), weight 102.1 kg, SpO2 99 %.  Examination General: Well-developed, well-nourished male in no acute distress; appearance consistent with age of record HENT: normocephalic; 1 cm laceration to  medial left tongue, hemostatic Eyes: Normal appearance Neck: supple Heart: regular rate and rhythm Lungs: clear to auscultation bilaterally Abdomen: soft; nondistended; nontender; bowel sounds present Extremities: No deformity; full range of motion Neurologic: Awake, alert and oriented; motor function intact in all extremities and symmetric; no facial droop Skin: Warm and dry Psychiatric: Normal mood and affect   RESULTS  Summary of this visit's results, reviewed and interpreted by myself:   EKG Interpretation  Date/Time:    Ventricular Rate:    PR Interval:    QRS Duration:   QT Interval:    QTC Calculation:   R Axis:     Text Interpretation:         Laboratory Studies: No results found for this or any previous visit (from the past 24 hour(s)). Imaging Studies: No results found.  ED COURSE and MDM  Nursing notes, initial and subsequent vitals signs, including pulse oximetry, reviewed and interpreted by myself.  Vitals:   08/24/21 2157 08/24/21 2200  BP:  139/84  Pulse:  70  Resp:  18  Temp:  98.6 F (37 C)  TempSrc:  Oral  SpO2:  99%  Weight: 102.1 kg   Height: '5\' 8"'$  (1.727 m)    Medications  lidocaine-EPINEPHrine (XYLOCAINE W/EPI) 2 %-1:100000 (with pres) injection 20 mL (has no administration in time range)    We will apply lidocaine with epinephrine topically to help maintain hemostasis.  Patient advised that her regular teabag, moistened, held against the wound may help control bleeding if it recurs.   PROCEDURES  Procedures   ED DIAGNOSES     ICD-10-CM   1. Laceration of tongue, initial encounter  [id number]          Shanon Rosser, MD 08/25/21 [phone]

## 2021-08-30 ENCOUNTER — Ambulatory Visit (INDEPENDENT_AMBULATORY_CARE_PROVIDER_SITE_OTHER): Payer: BC Managed Care – PPO | Admitting: Family

## 2021-08-30 ENCOUNTER — Encounter: Payer: Self-pay | Admitting: Family

## 2021-08-30 ENCOUNTER — Other Ambulatory Visit: Payer: Self-pay

## 2021-08-30 VITALS — BP 118/80 | HR 82 | Temp 98.6°F | Ht 68.0 in | Wt 221.0 lb

## 2021-08-30 DIAGNOSIS — J019 Acute sinusitis, unspecified: Secondary | ICD-10-CM | POA: Diagnosis not present

## 2021-08-30 MED ORDER — AMOXICILLIN-POT CLAVULANATE 875-125 MG PO TABS: 1.0000 | ORAL_TABLET | Freq: Two times a day (BID) | ORAL | 0 refills | Status: AC

## 2021-08-30 NOTE — Progress Notes (Signed)
Scott Phillips is a 50 y.o. male with the following history as recorded in EpicCare:  Patient Active Problem List   Diagnosis Date Noted   Patellofemoral pain syndrome of right knee 01/16/2021   Perennial allergic rhinitis 11/15/2020   Chronic sinusitis 11/15/2020   Allergic conjunctivitis 11/15/2020   Annual physical exam 10/14/2020   Serrated polyp of colon 07/13/2020   PCP notes >>>>>>>>>> 07/13/2020   GERD 06/26/2020   OSA (obstructive sleep apnea) 06/26/2020    Current Outpatient Medications  Medication Sig Dispense Refill   amoxicillin-clavulanate (AUGMENTIN) 875-125 MG tablet Take 1 tablet by mouth 2 (two) times daily for 10 days. 20 tablet 0   b complex vitamins tablet Take 1 tablet by mouth 3 (three) times a week.     Calcium-Magnesium-Zinc (CALCIUM-MAGNESUIUM-ZINC PO) Take by mouth 2 (two) times daily.     Carbinoxamine Maleate 4 MG TABS Take 1 tablet (4 mg total) by mouth every 6 (six) hours as needed. 60 tablet 5   famotidine (PEPCID) 20 MG tablet Take 1 tablet (20 mg total) by mouth 2 (two) times daily before a meal. 180 tablet 1   Fluticasone Propionate (XHANCE) 93 MCG/ACT EXHU Place 2 puffs into the nose 2 (two) times daily as needed. 32 mL 5   Inulin (FIBER CHOICE PO) Take by mouth 5 (five) times daily.     Misc Natural Products (TURMERIC CURCUMIN) CAPS Take 1,000 each by mouth.     Multiple Vitamin (MULTIVITAMIN ADULT PO) Take by mouth.     pantoprazole (PROTONIX) 40 MG tablet Take 1 tablet (40 mg total) by mouth 2 (two) times daily before a meal. 180 tablet 3   Probiotic Product (PROBIOTIC-10) CHEW Chew by mouth.     No current facility-administered medications for this visit.    Allergies: Milk-related compounds  Past Medical History:  Diagnosis Date   Allergic rhinosinusitis    Allergy    Arthritis    Colon polyps    Depression    Diverticulitis    GERD (gastroesophageal reflux disease)    Sinusitis    Sleep apnea     Past Surgical History:   Procedure Laterality Date   CARPAL TUNNEL RELEASE     KNEE ARTHROSCOPY Right 1991   cyst removal   MAXILLARY ANTROSTOMY Right 06/29/2020   Procedure: RIGHT MAXILLARY ANTROSTOMY WITH TISSUE REMOVAL;  Surgeon: Leta Baptist, MD;  Location: [facility];  Service: ENT;  Laterality: Right;   NASAL SINUS SURGERY     x3 in [place]   SUBACROMIAL DECOMPRESSION Left    TRIGGER FINGER RELEASE Right 2018   R 3rd   VASECTOMY  2019    Family History  Problem Relation Age of Onset   Cancer Mother    Depression Mother    Hypertension Mother    Miscarriages / [place] Mother    Cancer Father    Alcohol abuse Father    Arthritis Father    Diabetes Father    Hearing loss Father    Allergic rhinitis Sister    Depression Maternal Grandmother    Hearing loss Maternal Grandfather    Asthma Maternal Grandfather    Diabetes Paternal Grandfather    Colon cancer Neg Hx    Prostate cancer Neg Hx     Social History   Tobacco Use   Smoking status: Former    Packs/day: 2.00    Types: Cigarettes    Start date: 12/30/1991    Quit date: 12/29/2014    Years  since quitting: 6.6   Smokeless tobacco: Never  Substance Use Topics   Alcohol use: Yes    Comment: occas    Subjective:   Patient presents with concerns for possible sinus infection. Symptoms x 3 days; + dark, discolored mucus;  History of chronic sinus issues- has had multiple sinus surgeries;  No chest pain, shortness of breath;     Objective:  Vitals:   08/30/21 1442  BP: 118/80  Pulse: 82  Temp: 98.6 F (37 C)  TempSrc: Oral  SpO2: 98%  Weight: 221 lb (100.2 kg)  Height: '5\' 8"'$  (1.727 m)    General: Well developed, well nourished, in no acute distress  Skin : Warm and dry.  Head: Normocephalic and atraumatic  Eyes: Sclera and conjunctiva clear; pupils round and reactive to light; extraocular movements intact  Ears: External normal; canals clear; tympanic membranes normal  Oropharynx: Pink, supple. No  suspicious lesions  Neck: Supple without thyromegaly, adenopathy  Lungs: Respirations unlabored; clear to auscultation bilaterally without wheeze, rales, rhonchi  CVS exam: normal rate and regular rhythm.  Neurologic: Alert and oriented; speech intact; face symmetrical; moves all extremities well; CNII-XII intact without focal deficit   Assessment:  1. Acute sinusitis, recurrence not specified, unspecified location     Plan:  Rx for Augmentin 875 mg bid x 10 days; continue allergy medications; follow up worse, no better.  This visit occurred during the SARS-CoV-2 public health emergency.  Safety protocols were in place, including screening questions prior to the visit, additional usage of staff PPE, and extensive cleaning of exam room while observing appropriate contact time as indicated for disinfecting solutions.    No follow-ups on file.  No orders of the defined types were placed in this encounter.   Requested Prescriptions   Signed Prescriptions Disp Refills   amoxicillin-clavulanate (AUGMENTIN) 875-125 MG tablet 20 tablet 0    Sig: Take 1 tablet by mouth 2 (two) times daily for 10 days.

## 2021-10-01 DIAGNOSIS — J0101 Acute recurrent maxillary sinusitis: Secondary | ICD-10-CM | POA: Diagnosis not present

## 2021-10-01 DIAGNOSIS — J310 Chronic rhinitis: Secondary | ICD-10-CM | POA: Diagnosis not present

## 2021-10-01 DIAGNOSIS — J343 Hypertrophy of nasal turbinates: Secondary | ICD-10-CM | POA: Diagnosis not present

## 2021-10-14 ENCOUNTER — Other Ambulatory Visit (INDEPENDENT_AMBULATORY_CARE_PROVIDER_SITE_OTHER): Payer: BC Managed Care – PPO

## 2021-10-14 ENCOUNTER — Telehealth: Payer: Self-pay | Admitting: *Deleted

## 2021-10-14 ENCOUNTER — Other Ambulatory Visit: Payer: Self-pay

## 2021-10-14 DIAGNOSIS — Z0000 Encounter for general adult medical examination without abnormal findings: Secondary | ICD-10-CM

## 2021-10-14 LAB — CBC WITH DIFFERENTIAL/PLATELET
Basophils Absolute: 0 10*3/uL (ref 0.0–0.1)
Basophils Relative: 0.4 % (ref 0.0–3.0)
Eosinophils Absolute: 0.1 10*3/uL (ref 0.0–0.7)
Eosinophils Relative: 0.9 % (ref 0.0–5.0)
HCT: 40.1 % (ref 39.0–52.0)
Hemoglobin: 13.5 g/dL (ref 13.0–17.0)
Lymphocytes Relative: 32.1 % (ref 12.0–46.0)
Lymphs Abs: 2.1 10*3/uL (ref 0.7–4.0)
MCHC: 33.6 g/dL (ref 30.0–36.0)
MCV: 93.4 fl (ref 78.0–100.0)
Monocytes Absolute: 1.2 10*3/uL — ABNORMAL HIGH (ref 0.1–1.0)
Monocytes Relative: 19.4 % — ABNORMAL HIGH (ref 3.0–12.0)
Neutro Abs: 3 10*3/uL (ref 1.4–7.7)
Neutrophils Relative %: 47.2 % (ref 43.0–77.0)
Platelets: 183 10*3/uL (ref 150.0–400.0)
RBC: 4.29 Mil/uL (ref 4.22–5.81)
RDW: 14.2 % (ref 11.5–15.5)
WBC: 6.4 10*3/uL (ref 4.0–10.5)

## 2021-10-14 LAB — LIPID PANEL
Cholesterol: 158 mg/dL (ref 0–200)
HDL: 48.7 mg/dL (ref >39.00)
LDL Cholesterol: 85 mg/dL (ref 0–99)
NonHDL: 109.08
Total CHOL/HDL Ratio: 3
Triglycerides: 119 mg/dL (ref 0.0–149.0)
VLDL: 23.8 mg/dL (ref 0.0–40.0)

## 2021-10-14 LAB — COMPREHENSIVE METABOLIC PANEL
ALT: 23 U/L (ref 0–53)
AST: 19 U/L (ref 0–37)
Albumin: 4.3 g/dL (ref 3.5–5.2)
Alkaline Phosphatase: 49 U/L (ref 39–117)
BUN: 18 mg/dL (ref 6–23)
CO2: 29 mEq/L (ref 19–32)
Calcium: 9.7 mg/dL (ref 8.4–10.5)
Chloride: 101 mEq/L (ref 96–112)
Creatinine, Ser: 1.08 mg/dL (ref 0.40–1.50)
GFR: 81.48 mL/min (ref >60.00)
Glucose, Bld: 93 mg/dL (ref 70–99)
Potassium: 4.8 mEq/L (ref 3.5–5.1)
Sodium: 138 mEq/L (ref 135–145)
Total Bilirubin: 1.2 mg/dL (ref 0.2–1.2)
Total Protein: 6.7 g/dL (ref 6.0–8.3)

## 2021-10-14 NOTE — Telephone Encounter (Signed)
Thank you :)

## 2021-10-14 NOTE — Telephone Encounter (Signed)
Has a CPX in a couple of days: CMP, FLP, CBC

## 2021-10-14 NOTE — Telephone Encounter (Signed)
Pt had lab appointment this morning. There were no future orders in Epic.  Pt's last labs were 10/03/20 and he has an upcoming appt with PCP on 10/16/21.  I drew one SST (chemistry) and one lavender (hematology) for potential tests.  Please place future lab orders if appropriate.

## 2021-10-14 NOTE — Telephone Encounter (Signed)
Orders placed.

## 2021-10-16 ENCOUNTER — Ambulatory Visit (INDEPENDENT_AMBULATORY_CARE_PROVIDER_SITE_OTHER): Payer: BC Managed Care – PPO | Admitting: Internal Medicine

## 2021-10-16 ENCOUNTER — Other Ambulatory Visit: Payer: Self-pay

## 2021-10-16 ENCOUNTER — Other Ambulatory Visit: Payer: BC Managed Care – PPO

## 2021-10-16 ENCOUNTER — Encounter: Payer: Self-pay | Admitting: Internal Medicine

## 2021-10-16 ENCOUNTER — Ambulatory Visit: Payer: BC Managed Care – PPO | Attending: Internal Medicine

## 2021-10-16 ENCOUNTER — Encounter: Payer: BC Managed Care – PPO | Admitting: Internal Medicine

## 2021-10-16 ENCOUNTER — Telehealth: Payer: Self-pay | Admitting: Internal Medicine

## 2021-10-16 VITALS — BP 122/64 | HR 67 | Temp 98.3°F | Resp 16 | Ht 68.0 in | Wt 212.5 lb

## 2021-10-16 DIAGNOSIS — Z0000 Encounter for general adult medical examination without abnormal findings: Secondary | ICD-10-CM | POA: Diagnosis not present

## 2021-10-16 DIAGNOSIS — Z1159 Encounter for screening for other viral diseases: Secondary | ICD-10-CM

## 2021-10-16 DIAGNOSIS — Z114 Encounter for screening for human immunodeficiency virus [HIV]: Secondary | ICD-10-CM | POA: Diagnosis not present

## 2021-10-16 DIAGNOSIS — Z23 Encounter for immunization: Secondary | ICD-10-CM | POA: Diagnosis not present

## 2021-10-16 MED ORDER — CARBINOXAMINE MALEATE 4 MG PO TABS: 4.0000 mg | ORAL_TABLET | Freq: Four times a day (QID) | ORAL | 5 refills | Status: DC | PRN

## 2021-10-16 NOTE — Assessment & Plan Note (Signed)
-  Td 2018 -Covid vax  x2, recommend bivalent booster - flu shot today -CCS:  C-scope done 09/28/2017, according to report procedure done because LLQ abdominal pain and abnormal GI x-ray.  Findings: Cecum polyp, diverticulosis, BX: Sessile serrated polyp.  Next in 5 years (informally d/w Dr. Bryan Lemma 2021) -Prostate cancer screening: Had a PSA recently, normal, no family history, start screening at age 50. -Patient education: Doing great with diet and exercise. Praised  -Recent CMP, FLP and CBC reviewed, significant improvement on his lipid profile.  Will try to add hep C and HIV screening

## 2021-10-16 NOTE — Progress Notes (Signed)
Subjective:    Patient ID: Scott Phillips, male    DOB: 12-03-1971, 50 y.o.   MRN: [id number]  DOS:  10/16/2021 Type of visit - description: CPX  Since the last office he is doing great. Following a lower carbohydrate diet. Exercising almost daily. + Weight loss.  He feels really well.  Wt Readings from Last 3 Encounters:  10/16/21 212 lb 8 oz (96.4 kg)  08/30/21 221 lb (100.2 kg)  08/24/21 225 lb (102.1 kg)    Review of Systems  Other than above, a 14 point review of systems is negative       Past Medical History:  Diagnosis Date   Allergic rhinosinusitis    Allergy    Arthritis    Colon polyps    Depression    Diverticulitis    GERD (gastroesophageal reflux disease)    Sinusitis    Sleep apnea     Past Surgical History:  Procedure Laterality Date   CARPAL TUNNEL RELEASE     KNEE ARTHROSCOPY Right 1991   cyst removal   MAXILLARY ANTROSTOMY Right 06/29/2020   Procedure: RIGHT MAXILLARY ANTROSTOMY WITH TISSUE REMOVAL;  Surgeon: Leta Baptist, MD;  Location: [facility];  Service: ENT;  Laterality: Right;   NASAL SINUS SURGERY     x3 in [place]   SUBACROMIAL DECOMPRESSION Left    TRIGGER FINGER RELEASE Right 2018   R 3rd   VASECTOMY  2019   Social History   Socioeconomic History   Marital status: Married    Spouse name: Not on file   Number of children: 1   Years of education: Not on file   Highest education level: Not on file  Occupational History   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  Tobacco Use   Smoking status: Former    Packs/day: 2.00    Types: Cigarettes    Start date: 12/30/1991    Quit date: 12/29/2014    Years since quitting: 6.8   Smokeless tobacco: Never  Vaping Use   Vaping Use: Never used  Substance and Sexual Activity   Alcohol use: Yes    Comment: occas   Drug use: Never   Sexual activity: Not on file  Other Topics Concern   Not on file  Social History Narrative   Daughter born  2018   Social  Determinants of Health   Financial Resource Strain: Not on file  Food Insecurity: Not on file  Transportation Needs: Not on file  Physical Activity: Not on file  Stress: Not on file  Social Connections: Not on file  Intimate Partner Violence: Not on file    Allergies as of 10/16/2021       Reactions   Milk-related Compounds         Medication List        Accurate as of October 16, 2021  3:06 PM. If you have any questions, ask your nurse or doctor.          STOP taking these medications    FIBER CHOICE PO Stopped by: [name], MD       TAKE these medications    b complex vitamins tablet Take 1 tablet by mouth 3 (three) times a week.   CALCIUM-MAGNESUIUM-ZINC PO Take by mouth 2 (two) times daily.   Carbinoxamine Maleate 4 MG Tabs Take 1 tablet (4 mg total) by mouth every 6 (six) hours as needed.   famotidine 20 MG tablet Commonly known as: PEPCID  Take 1 tablet (20 mg total) by mouth 2 (two) times daily before a meal.   METAMUCIL FIBER PO Take by mouth.   MULTIVITAMIN ADULT PO Take by mouth.   pantoprazole 40 MG tablet Commonly known as: PROTONIX Take 1 tablet (40 mg total) by mouth 2 (two) times daily before a meal.   PROBIOTIC-10 PO Take by mouth. What changed: Another medication with the same name was removed. Continue taking this medication, and follow the directions you see here. Changed by: [name], MD   Turmeric Curcumin Caps Take 1,000 each by mouth.   Xhance 93 MCG/ACT Exhu Generic drug: Fluticasone Propionate Place 2 puffs into the nose 2 (two) times daily as needed.           Objective:   Physical Exam BP 122/64 (BP Location: Left Arm, Patient Position: Sitting, Cuff Size: Normal)   Pulse 67   Temp 98.3 F (36.8 C) (Oral)   Resp 16   Ht 5\' 8"  (1.727 m)   Wt 212 lb 8 oz (96.4 kg)   SpO2 97%   BMI 32.31 kg/m  General: Well developed, NAD, BMI noted Neck: No  thyromegaly  HEENT:  Normocephalic . Face symmetric,  atraumatic Lungs:  CTA B Normal respiratory effort, no intercostal retractions, no accessory muscle use. Heart: RRR,  no murmur.  Abdomen:  Not distended, soft, non-tender. No rebound or rigidity.   Lower extremities: no pretibial edema bilaterally  Skin: Exposed areas without rash. Not pale. Not jaundice Neurologic:  alert & oriented X3.  Speech normal, gait appropriate for age and unassisted Strength symmetric and appropriate for age.  Psych: Cognition and judgment appear intact.  Cooperative with normal attention span and concentration.  Behavior appropriate. No anxious or depressed appearing.     Assessment      ASSESSMENT New patient 06-2020, moved from [place]. OSA , dx ~2010, +CPAP GERD: had EGD in [place] per pt remotely (2006?) Colon Polyps H/o Diverticulitis  H/o Depression Allergies  Sinus issues: Surgery 2012, surgery 06/2020  PLAN Here for CPX OSA: On CPAP GERD: Requiring PPIs twice daily and a H2 blocker.  Symptoms are controlled. Previously he tried to decrease medication but that did not work.  Reports had a EGD remotely for sxs of GERD. Weight loss: Doing great with diet. Allergies, refill carbinoxamine RTC 1 year  This visit occurred during the SARS-CoV-2 public health emergency.  Safety protocols were in place, including screening questions prior to the visit, additional usage of staff PPE, and extensive cleaning of exam room while observing appropriate contact time as indicated for disinfecting solutions.

## 2021-10-16 NOTE — Patient Instructions (Signed)
You are doing great  Proceed with your COVID-vaccine    GO TO THE FRONT DESK, PLEASE SCHEDULE YOUR APPOINTMENTS Come back for a physical exam in 1 year

## 2021-10-16 NOTE — Telephone Encounter (Signed)
Pt. Wants to see can orders be placed in for labs for next year cpe so he can complete before coming.

## 2021-10-16 NOTE — Telephone Encounter (Signed)
Please tell him to call several days before his appt next year- Dr. Larose Kells won't place those for next year this far in advance. Thank you.

## 2021-10-16 NOTE — Progress Notes (Signed)
   Covid-19 Vaccination Clinic  Name:  Scott Phillips    MRN: [id number] DOB: July 13, 1971  10/16/2021  Mr. [name] was observed post Covid-19 immunization for 15 minutes without incident. He was provided with Vaccine Information Sheet and instruction to access the V-Safe system.   Mr. [name] was instructed to call 911 with any severe reactions post vaccine: Difficulty breathing  Swelling of face and throat  A fast heartbeat  A bad rash all over body  Dizziness and weakness

## 2021-10-16 NOTE — Assessment & Plan Note (Signed)
Here for CPX OSA: On CPAP GERD: Requiring PPIs twice daily and a H2 blocker.  Symptoms are controlled. Previously he tried to decrease medication but that did not work.  Reports had a EGD remotely for sxs of GERD. Weight loss: Doing great with diet. Allergies, refill carbinoxamine RTC 1 year

## 2021-10-17 LAB — HEPATITIS C ANTIBODY
Hepatitis C Ab: NONREACTIVE
SIGNAL TO CUT-OFF: 0.05 (ref <1.00)

## 2021-10-17 LAB — HIV ANTIBODY (ROUTINE TESTING W REFLEX): HIV 1&2 Ab, 4th Generation: NONREACTIVE

## 2021-10-24 DIAGNOSIS — L814 Other melanin hyperpigmentation: Secondary | ICD-10-CM | POA: Diagnosis not present

## 2021-10-24 DIAGNOSIS — L821 Other seborrheic keratosis: Secondary | ICD-10-CM | POA: Diagnosis not present

## 2021-10-24 DIAGNOSIS — D229 Melanocytic nevi, unspecified: Secondary | ICD-10-CM | POA: Diagnosis not present

## 2021-10-24 DIAGNOSIS — X32XXXS Exposure to sunlight, sequela: Secondary | ICD-10-CM | POA: Diagnosis not present

## 2021-10-31 ENCOUNTER — Encounter: Payer: Self-pay | Admitting: Pulmonary Disease

## 2021-10-31 ENCOUNTER — Other Ambulatory Visit: Payer: Self-pay

## 2021-10-31 ENCOUNTER — Ambulatory Visit (INDEPENDENT_AMBULATORY_CARE_PROVIDER_SITE_OTHER): Payer: BC Managed Care – PPO | Admitting: Internal Medicine

## 2021-10-31 VITALS — BP 138/86 | HR 80 | Temp 98.6°F | Resp 16 | Ht 68.0 in | Wt 210.0 lb

## 2021-10-31 DIAGNOSIS — B079 Viral wart, unspecified: Secondary | ICD-10-CM | POA: Diagnosis not present

## 2021-10-31 NOTE — Progress Notes (Signed)
Subjective:    Patient ID: Scott Phillips, male    DOB: 12/18/1971, 50 y.o.   MRN: [id number]  DOS:  10/31/2021 Type of visit - description: acute  Recently saw dermatology, she has chronic skin lesions on the genital area. They have been unchanged for years. Was recommended further treatment and biopsies.  Review of Systems See above   Past Medical History:  Diagnosis Date   Allergic rhinosinusitis    Allergy    Arthritis    Colon polyps    Depression    Diverticulitis    GERD (gastroesophageal reflux disease)    Sinusitis    Sleep apnea     Past Surgical History:  Procedure Laterality Date   CARPAL TUNNEL RELEASE     KNEE ARTHROSCOPY Right 1991   cyst removal   MAXILLARY ANTROSTOMY Right 06/29/2020   Procedure: RIGHT MAXILLARY ANTROSTOMY WITH TISSUE REMOVAL;  Surgeon: Leta Baptist, MD;  Location: [facility];  Service: ENT;  Laterality: Right;   NASAL SINUS SURGERY     x3 in [place]   SUBACROMIAL DECOMPRESSION Left    TRIGGER FINGER RELEASE Right 2018   R 3rd   VASECTOMY  2019    Allergies as of 10/31/2021       Reactions   Milk-related Compounds         Medication List        Accurate as of October 31, 2021  3:06 PM. If you have any questions, ask your nurse or doctor.          b complex vitamins tablet Take 1 tablet by mouth 3 (three) times a week.   CALCIUM-MAGNESUIUM-ZINC PO Take by mouth 2 (two) times daily.   Carbinoxamine Maleate 4 MG Tabs Take 1 tablet (4 mg total) by mouth every 6 (six) hours as needed.   famotidine 20 MG tablet Commonly known as: PEPCID Take 1 tablet (20 mg total) by mouth 2 (two) times daily before a meal.   METAMUCIL FIBER PO Take by mouth.   MULTIVITAMIN ADULT PO Take by mouth.   pantoprazole 40 MG tablet Commonly known as: PROTONIX Take 1 tablet (40 mg total) by mouth 2 (two) times daily before a meal.   PROBIOTIC-10 PO Take by mouth.   Turmeric Curcumin Caps Take 1,000 each by  mouth.   Xhance 93 MCG/ACT Exhu Generic drug: Fluticasone Propionate Place 2 puffs into the nose 2 (two) times daily as needed.           Objective:   Physical Exam BP 138/86   Pulse 80   Temp 98.6 F (37 C)   Resp 16   Ht 5\' 8"  (1.727 m)   Wt 210 lb (95.3 kg)   SpO2 98%   BMI 31.93 kg/m  General:   Well developed, NAD, BMI noted. HEENT:  Normocephalic . Face symmetric, atraumatic  Skin: At the suprapubic area, near the penis, he has several small, hyperpigmented, irregular surface skin lesions between 3 to 4 mm in diameter.  Has one that is larger, hyperpigmented neurologic:  alert & oriented X3.  Speech normal, gait appropriate for age and unassisted Psych--  Cognition and judgment appear intact.  Cooperative with normal attention span and concentration.  Behavior appropriate. No anxious or depressed appearing.      Assessment     ASSESSMENT New patient 06-2020, moved from [place]. OSA , dx ~2010, +CPAP GERD: had EGD in [place] 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  Patient wonders if that is okay. Although I am not a dermatologist I think that is a reasonable plan, offered a second opinion, he declined, plans to proceed with the plan.  This visit occurred during the SARS-CoV-2 public health emergency.  Safety protocols were in place, including screening questions prior to the visit, additional usage of staff PPE, and extensive cleaning of exam room while observing appropriate contact time as indicated for disinfecting solutions.

## 2021-10-31 NOTE — Patient Instructions (Signed)
Proceed with the dermatologist plan

## 2021-11-01 ENCOUNTER — Other Ambulatory Visit: Payer: Self-pay

## 2021-11-01 ENCOUNTER — Encounter: Payer: Self-pay | Admitting: Pulmonary Disease

## 2021-11-01 ENCOUNTER — Ambulatory Visit (INDEPENDENT_AMBULATORY_CARE_PROVIDER_SITE_OTHER): Payer: BC Managed Care – PPO | Admitting: Pulmonary Disease

## 2021-11-01 VITALS — BP 110/72 | HR 61 | Temp 98.3°F | Ht 68.0 in | Wt 209.0 lb

## 2021-11-01 DIAGNOSIS — Z9989 Dependence on other enabling machines and devices: Secondary | ICD-10-CM | POA: Diagnosis not present

## 2021-11-01 DIAGNOSIS — G4733 Obstructive sleep apnea (adult) (pediatric): Secondary | ICD-10-CM | POA: Diagnosis not present

## 2021-11-01 NOTE — Patient Instructions (Signed)
We will contact the medical supply company to check whether your insurance will require a new attest to qualify for a new machine  Otherwise, we will send a prescription for current CPAP pressures of 6-15 to your medical supply company for new machine  Once you have more information about coverage for a portable traveling device, we will be glad to provide you with a prescription for portable CPAP  Call with significant concerns  We will keep the appointments at a year

## 2021-11-01 NOTE — Assessment & Plan Note (Signed)
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  Patient wonders if that is okay. Although I am not a dermatologist I think that is a reasonable plan, offered a second opinion, he declined, plans to proceed with the plan.

## 2021-11-01 NOTE — Progress Notes (Signed)
Scott Phillips    [id number]    [birth year]  Primary Care Physician:Paz, Alda Berthold, MD  Referring Physician: Colon Branch, MD [address] [place],  [place] [address]  Chief complaint:   Patient with a history of obstructive sleep apnea  HPI: Continues to use CPAP on a regular basis  Machine is getting dated and sometimes difficulty with turning the machine on Humidification unit is also not working well  Continues to use CPAP nightly and benefiting from it  Diagnosed with obstructive sleep apnea over 10 years ago Has been using CPAP on a regular basis  Bedtime is usually between 11 and 1230 Falls asleep quickly About 1 awakening Final wake up time about 715  Weight fluctuates  Current machine is about 48 years old Not working optimally any longer  Not having any significant daytime symptoms  He stated his old study just met the threshold for treatment  Outpatient Encounter Medications as of 11/27/2020  Medication Sig   azelastine (ASTELIN) 0.1 % nasal spray 1-2 sprays each nostril twice daily as needed   b complex vitamins tablet Take 1 tablet by mouth 3 (three) times a week.   Calcium-Magnesium-Zinc (CALCIUM-MAGNESUIUM-ZINC PO) Take by mouth 2 (two) times daily.   Carbinoxamine Maleate 4 MG TABS Take 1 tablet (4 mg total) by mouth every 6 (six) hours as needed.   famotidine (PEPCID) 20 MG tablet Take 1 tablet (20 mg total) by mouth 2 (two) times daily before a meal.   fluticasone (FLONASE) 50 MCG/ACT nasal spray Place 2 sprays into both nostrils daily.   Fluticasone Propionate (XHANCE) 93 MCG/ACT EXHU Place 2 puffs into the nose 2 (two) times daily as needed.   Inulin (FIBER CHOICE PO) Take by mouth 5 (five) times daily.   Misc Natural Products (TURMERIC CURCUMIN) CAPS Take 1,000 each by mouth.   Multiple Vitamin (MULTIVITAMIN ADULT PO) Take by mouth.   Olopatadine HCl (PATADAY) 0.2 % SOLN Place 1 drop into both eyes daily as needed.    pantoprazole (PROTONIX) 40 MG tablet Take 1 tablet (40 mg total) by mouth 2 (two) times daily before a meal.   Probiotic Product (PROBIOTIC-10) CHEW Chew by mouth.   [DISCONTINUED] cetirizine (ZYRTEC) 10 MG tablet Take 10 mg by mouth daily.   No facility-administered encounter medications on file as of 11/27/2020.    Allergies as of 11/27/2020 - Review Complete 11/27/2020  Allergen Reaction Noted   Milk-related compounds  06/29/2020    Past Medical History:  Diagnosis Date   Allergic rhinosinusitis    Allergy    Arthritis    Colon polyps    Depression    Diverticulitis    GERD (gastroesophageal reflux disease)    Sinusitis    Sleep apnea     Past Surgical History:  Procedure Laterality Date   KNEE ARTHROSCOPY Right 1991   cyst removal   MAXILLARY ANTROSTOMY Right 06/29/2020   Procedure: RIGHT MAXILLARY ANTROSTOMY WITH TISSUE REMOVAL;  Surgeon: Leta Baptist, MD;  Location: [facility];  Service: ENT;  Laterality: Right;   NASAL SINUS SURGERY     x3 in [place]   SUBACROMIAL DECOMPRESSION Left    TRIGGER FINGER RELEASE Right 2018   R 3rd   VASECTOMY  2019    Family History  Problem Relation Age of Onset   Cancer Mother    Depression Mother    Hypertension Mother    Miscarriages /  Stillbirths Mother    Cancer Father    Alcohol abuse Father    Arthritis Father    Diabetes Father    Hearing loss Father    Allergic rhinitis Sister    Depression Maternal Grandmother    Hearing loss Maternal Grandfather    Asthma Maternal Grandfather    Diabetes Paternal Grandfather    Colon cancer Neg Hx    Prostate cancer Neg Hx     Social History   Socioeconomic History   Marital status: Married    Spouse name: Not on file   Number of children: 1   Years of education: Not on file   Highest education level: Not on file  Occupational History   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  Tobacco Use   Smoking status: Former Smoker    Packs/day: 2.00     Types: Cigarettes    Start date: 12/30/1991    Quit date: 12/29/2014    Years since quitting: 5.9   Smokeless tobacco: Never Used  Vaping Use   Vaping Use: Never used  Substance and Sexual Activity   Alcohol use: Yes    Comment: occas   Drug use: Never   Sexual activity: Not on file  Other Topics Concern   Not on file  Social History Narrative   Daughter born  2018   Social Determinants of Health   Financial Resource Strain:    Difficulty of Paying Living Expenses: Not on file  Food Insecurity:    Worried About Charity fundraiser in the Last Year: Not on file   [place] in the Last Year: Not on file  Transportation Needs:    Lack of Transportation (Medical): Not on file   Lack of Transportation (Non-Medical): Not on file  Physical Activity:    Days of Exercise per Week: Not on file   Minutes of Exercise per Session: Not on file  Stress:    Feeling of Stress : Not on file  Social Connections:    Frequency of Communication with Friends and Family: Not on file   Frequency of Social Gatherings with Friends and Family: Not on file   Attends Religious Services: Not on file   Active Member of Clubs or Organizations: Not on file   Attends Archivist Meetings: Not on file   Marital Status: Not on file  Intimate Partner Violence:    Fear of Current or Ex-Partner: Not on file   Emotionally Abused: Not on file   Physically Abused: Not on file   Sexually Abused: Not on file    Review of Systems  Respiratory:  Positive for apnea.   Psychiatric/Behavioral:  Positive for sleep disturbance.   All other systems reviewed and are negative.  Vitals:   11/27/20 1152  BP: 122/74  Pulse: 69  Temp: 98.5 F (36.9 C)  SpO2: 99%     Physical Exam Constitutional:      Appearance: He is obese.  HENT:     Mouth/Throat:     Mouth: Mucous membranes are moist.     Comments: Crowded oropharynx Eyes:     General:        Right eye: No discharge.        Left eye: No  discharge.  Neck:     Comments: 17-1/2 neck size  Cardiovascular:     Rate and Rhythm: Normal rate and regular rhythm.     Heart sounds:    No friction rub.  Pulmonary:     Effort: Pulmonary effort is normal. No respiratory distress.     Breath sounds: No stridor. No wheezing or rhonchi.  Musculoskeletal:     Cervical back: No rigidity or tenderness.  Neurological:     Mental Status: He is alert.  Psychiatric:        Mood and Affect: Mood normal.   Results of the Epworth flowsheet 11/27/2020  Sitting and reading 1  Watching TV 1  Sitting, inactive in a public place (e.g. a theatre or a meeting) 0  As a passenger in a car for an hour without a break 1  Lying down to rest in the afternoon when circumstances permit 3  Sitting and talking to someone 0  Sitting quietly after a lunch without alcohol 0  In a car, while stopped for a few minutes in traffic 0  Total score 6    Data Reviewed: [place]  Compliance data reviewed showing 100% compliance with CPAP On auto CPAP 6-15 Average use of 6 hours 57 minutes Residual AHI of 0.7  Assessment:  Obstructive sleep apnea -100% compliance  Obesity -Encouraged about weight loss efforts -Diet and exercise as tolerated   Plan/Recommendations:  Continue CPAP nightly  We will try and figure out where they need a new study to qualify for new CPAP or just send a prescription for CPAP 6-15  Yearly follow-up Soon[place] if needed   Sherrilyn Rist MD [facility] 11/27/2020, 12:08 PM  CC: Colon Branch, MD  Able to clarify does not need a new study-prescription for CPAP will be sent to DME company, auto CPAP 6-15 with heated humidification

## 2021-11-12 ENCOUNTER — Other Ambulatory Visit (HOSPITAL_BASED_OUTPATIENT_CLINIC_OR_DEPARTMENT_OTHER): Payer: Self-pay

## 2021-11-12 MED ORDER — MODERNA COVID-19 BIVAL BOOSTER 50 MCG/0.5ML IM SUSP
INTRAMUSCULAR | 0 refills | Status: DC
  Filled 2021-11-12: qty 0.5, 1d supply, fill #0

## 2021-11-15 ENCOUNTER — Other Ambulatory Visit: Payer: Self-pay

## 2021-11-15 ENCOUNTER — Ambulatory Visit (INDEPENDENT_AMBULATORY_CARE_PROVIDER_SITE_OTHER): Payer: BC Managed Care – PPO | Admitting: Family Medicine

## 2021-11-15 ENCOUNTER — Encounter: Payer: Self-pay | Admitting: Family Medicine

## 2021-11-15 VITALS — BP 120/70 | HR 60 | Temp 97.4°F | Ht 68.0 in | Wt 213.4 lb

## 2021-11-15 DIAGNOSIS — J0100 Acute maxillary sinusitis, unspecified: Secondary | ICD-10-CM

## 2021-11-15 MED ORDER — AMOXICILLIN-POT CLAVULANATE 875-125 MG PO TABS: 1.0000 | ORAL_TABLET | Freq: Two times a day (BID) | ORAL | 0 refills | Status: DC

## 2021-11-15 NOTE — Patient Instructions (Signed)

## 2021-11-15 NOTE — Progress Notes (Signed)
[facility] [address] [place] [address] Dept: [phone] Dept Fax: [phone]  Office Visit  Subjective:    Patient ID: Scott Phillips, male    DOB: Sep 09, 1971, 50 y.o..   MRN: [id number]  Chief Complaint  Patient presents with   Acute Visit    C/o having sinus drainage/congestion x 1 week. He has been doing Nettie pot, fluticasone, sudafed.      History of Present Illness:  Patient is in today with a 1-week history of sinus congestion, rhinorrhea/post-nasal drip and sore throat. He has not run fever. He has noted mild pain in the upper left teeth. He denies cough. Scott Phillips has a history of chronic sinusitis and perennial rhinitis. He has been using his nasal steroid spray and salt water nasal flushes (netty pot)  Past Medical History: Patient Active Problem List   Diagnosis Date Noted   Patellofemoral pain syndrome of right knee 01/16/2021   Perennial allergic rhinitis 11/15/2020   Chronic sinusitis 11/15/2020   Allergic conjunctivitis 11/15/2020   Annual physical exam 10/14/2020   Serrated polyp of colon 07/13/2020   PCP notes >>>>>>>>>> 07/13/2020   GERD 06/26/2020   OSA (obstructive sleep apnea) 06/26/2020   Past Surgical History:  Procedure Laterality Date   CARPAL TUNNEL RELEASE     KNEE ARTHROSCOPY Right 1991   cyst removal   MAXILLARY ANTROSTOMY Right 06/29/2020   Procedure: RIGHT MAXILLARY ANTROSTOMY WITH TISSUE REMOVAL;  Surgeon: Leta Baptist, MD;  Location: [facility];  Service: ENT;  Laterality: Right;   NASAL SINUS SURGERY     x3 in [place]   SUBACROMIAL DECOMPRESSION Left    TRIGGER FINGER RELEASE Right 2018   R 3rd   VASECTOMY  2019   Family History  Problem Relation Age of Onset   Cancer Mother    Depression Mother    Hypertension Mother    Miscarriages / [place] Mother    Cancer Father    Alcohol abuse Father    Arthritis Father    Diabetes Father     Hearing loss Father    Allergic rhinitis Sister    Depression Maternal Grandmother    Hearing loss Maternal Grandfather    Asthma Maternal Grandfather    Diabetes Paternal Grandfather    Colon cancer Neg Hx    Prostate cancer Neg Hx    Outpatient Medications Prior to Visit  Medication Sig Dispense Refill   b complex vitamins tablet Take 1 tablet by mouth 3 (three) times a week.     Calcium-Magnesium-Zinc (CALCIUM-MAGNESUIUM-ZINC PO) Take by mouth 2 (two) times daily.     Carbinoxamine Maleate 4 MG TABS Take 1 tablet (4 mg total) by mouth every 6 (six) hours as needed. 60 tablet 5   famotidine (PEPCID) 20 MG tablet Take 1 tablet (20 mg total) by mouth 2 (two) times daily before a meal. 180 tablet 1   Fluticasone Propionate (XHANCE) 93 MCG/ACT EXHU Place 2 puffs into the nose 2 (two) times daily as needed. 32 mL 5   METAMUCIL FIBER PO Take by mouth.     Misc Natural Products (TURMERIC CURCUMIN) CAPS Take 1,000 each by mouth.     Multiple Vitamin (MULTIVITAMIN ADULT PO) Take by mouth.     pantoprazole (PROTONIX) 40 MG tablet Take 1 tablet (40 mg total) by mouth 2 (two) times daily before a meal. 180 tablet 3   Probiotic Product (PROBIOTIC-10 PO) Take by mouth.  COVID-19 mRNA bivalent vaccine, Moderna, (MODERNA COVID-19 BIVAL BOOSTER) 50 MCG/0.5ML injection Inject into the muscle. 0.5 mL 0   No facility-administered medications prior to visit.   Allergies  Allergen Reactions   Milk-Related Compounds Other (See Comments)   Objective:   Today's Vitals   11/15/21 1435  BP: 120/70  Pulse: 60  Temp: (!) 97.4 F (36.3 C)  TempSrc: Temporal  SpO2: 99%  Weight: 213 lb 6.4 oz (96.8 kg)  Height: 5\' 8"  (1.727 m)   Body mass index is 32.45 kg/m.   General: Well developed, well nourished. No acute distress. HEENT: Normocephalic, non-traumatic. PERRL, EOMI. Conjunctiva clear. External ears normal. EAC and TMs normal   bilaterally. Nose with moderate redness and swelling. Moderate  whitish rhinorrhea. Mucous membranes moist. Oropharynx   clear. Good dentition. Neck: Supple. No lymphadenopathy. No thyromegaly. Lungs: Clear to auscultation bilaterally. No wheezing, rales or rhonchi. Psych: Alert and oriented. Normal mood and affect.  Health Maintenance Due  Topic Date Due   Pneumococcal Vaccine 34-74 Years old (2 - PCV) 11/24/2012     Assessment & Plan:   1. Acute maxillary sinusitis, recurrence not specified Scott Phillips's symptoms are starting to improve. I recommend he continue his nasal steroid and his netty pot use. If his symptoms are not further improved tomorrow, he should pick up a prescription for Augmentin. If improved, I recommend he not take the antibiotic, but give his symptoms more time to resolve.  - amoxicillin-clavulanate (AUGMENTIN) 875-125 MG tablet; Take 1 tablet by mouth 2 (two) times daily.  Dispense: 20 tablet; Refill: 0  Haydee Salter, MD

## 2021-11-19 DIAGNOSIS — G4733 Obstructive sleep apnea (adult) (pediatric): Secondary | ICD-10-CM | POA: Diagnosis not present

## 2021-11-24 ENCOUNTER — Telehealth: Payer: BC Managed Care – PPO | Admitting: Emergency Medicine

## 2021-11-24 DIAGNOSIS — U071 COVID-19: Secondary | ICD-10-CM

## 2021-11-24 MED ORDER — NIRMATRELVIR/RITONAVIR (PAXLOVID)TABLET: 3.0000 | ORAL_TABLET | Freq: Two times a day (BID) | ORAL | 0 refills | Status: AC

## 2021-11-24 NOTE — Progress Notes (Signed)
Virtual Visit Consent   Scott Phillips, you are scheduled for a virtual visit with a [facility] provider today.     Just as with appointments in the office, your consent must be obtained to participate.  Your consent will be active for this visit and any virtual visit you may have with one of our providers in the next 365 days.     If you have a MyChart account, a copy of this consent can be sent to you electronically.  All virtual visits are billed to your insurance company just like a traditional visit in the office.    As this is a virtual visit, video technology does not allow for your provider to perform a traditional examination.  This may limit your provider's ability to fully assess your condition.  If your provider identifies any concerns that need to be evaluated in person or the need to arrange testing (such as labs, EKG, etc.), we will make arrangements to do so.     Although advances in technology are sophisticated, we cannot ensure that it will always work on either your end or our end.  If the connection with a video visit is poor, the visit may have to be switched to a telephone visit.  With either a video or telephone visit, we are not always able to ensure that we have a secure connection.     I need to obtain your verbal consent now.   Are you willing to proceed with your visit today? yes   Scott Phillips has provided verbal consent on 11/24/2021 for a virtual visit (video or telephone).   Lestine Box, [place]   Date: 11/24/2021 10:52 AM   Virtual Visit via Video Note   I, Lestine Box, connected with  Scott Phillips  ([id number], 01-07-1971) on 11/24/21 at 10:45 AM EST by a video-enabled telemedicine application and verified that I am speaking with the correct person using two identifiers.  Location: Patient: Virtual Visit Location Patient: Home Provider: Virtual Visit Location Provider: Home Office   I discussed the limitations of evaluation and  management by telemedicine and the availability of in person appointments. The patient expressed understanding and agreed to proceed.    History of Present Illness: Scott Phillips is a 50 y.o. who identifies as a male who was assigned male at birth, and is being seen today for congestion, cough, fatigued, body aches, sore throat, and chills x 3 days ago.  Reports positive covid exposure.  Has tried OTC medications without relief.  Denies aggravating factors.  Denies previous symptoms in the past.   Denies fever, SOB, wheezing, chest pain, nausea, vomiting, changes in bowel or bladder habits.    ROS: As per HPI.  All other pertinent ROS negative.     HPI: HPI  Problems:  Patient Active Problem List   Diagnosis Date Noted   Patellofemoral pain syndrome of right knee 01/16/2021   Perennial allergic rhinitis 11/15/2020   Chronic sinusitis 11/15/2020   Allergic conjunctivitis 11/15/2020   Annual physical exam 10/14/2020   Serrated polyp of colon 07/13/2020   PCP notes >>>>>>>>>> 07/13/2020   GERD 06/26/2020   OSA (obstructive sleep apnea) 06/26/2020    Allergies:  Allergies  Allergen Reactions   Milk-Related Compounds Other (See Comments)   Medications:  Current Outpatient Medications:    nirmatrelvir/ritonavir EUA (PAXLOVID) 20 x 150 MG & 10 x 100MG  TABS, Take 3 tablets by mouth 2 (two) times daily for 5 days. (Take nirmatrelvir 150  mg two tablets twice daily for 5 days and ritonavir 100 mg one tablet twice daily for 5 days) Patient GFR is 81.48, Disp: 30 tablet, Rfl: 0   amoxicillin-clavulanate (AUGMENTIN) 875-125 MG tablet, Take 1 tablet by mouth 2 (two) times daily., Disp: 20 tablet, Rfl: 0   b complex vitamins tablet, Take 1 tablet by mouth 3 (three) times a week., Disp: , Rfl:    Calcium-Magnesium-Zinc (CALCIUM-MAGNESUIUM-ZINC PO), Take by mouth 2 (two) times daily., Disp: , Rfl:    Carbinoxamine Maleate 4 MG TABS, Take 1 tablet (4 mg total) by mouth every 6 (six) hours as  needed., Disp: 60 tablet, Rfl: 5   famotidine (PEPCID) 20 MG tablet, Take 1 tablet (20 mg total) by mouth 2 (two) times daily before a meal., Disp: 180 tablet, Rfl: 1   Fluticasone Propionate (XHANCE) 93 MCG/ACT EXHU, Place 2 puffs into the nose 2 (two) times daily as needed., Disp: 32 mL, Rfl: 5   METAMUCIL FIBER PO, Take by mouth., Disp: , Rfl:    Misc Natural Products (TURMERIC CURCUMIN) CAPS, Take 1,000 each by mouth., Disp: , Rfl:    Multiple Vitamin (MULTIVITAMIN ADULT PO), Take by mouth., Disp: , Rfl:    pantoprazole (PROTONIX) 40 MG tablet, Take 1 tablet (40 mg total) by mouth 2 (two) times daily before a meal., Disp: 180 tablet, Rfl: 3   Probiotic Product (PROBIOTIC-10 PO), Take by mouth., Disp: , Rfl:   Observations/Objective: Patient is well-developed, well-nourished in no acute distress.  Resting comfortably at home. Mildly fatigued appearing Head is normocephalic, atraumatic.  No labored breathing.  Speech is clear and coherent with logical content. Speaking in full sentences and tolerating own secretions Patient is alert and oriented at baseline.    Assessment and Plan: 1. COVID-19 virus infection COVID test was positive You should remain isolated in your home for 5 days from symptom onset AND greater than 72 hours after symptoms resolution (absence of fever without the use of fever-reducing medication and improvement in respiratory symptoms), whichever is longer Get plenty of rest and push fluids Paxlovid prescribed.  Take as directed and to completion Use zyrtec for nasal congestion, runny nose, and/or sore throat Use flonase for nasal congestion and runny nose Use medications daily for symptom relief Use OTC medications like ibuprofen or tylenol as needed fever or pain Follow up with PCP in 1-2 days via phone or e-visit for recheck and to ensure symptoms are improving Call or go to the ED if you have any new or worsening symptoms such as fever, worsening cough, shortness  of breath, chest tightness, chest pain, turning blue, changes in mental status, etc...    Follow Up Instructions: I discussed the assessment and treatment plan with the patient. The patient was provided an opportunity to ask questions and all were answered. The patient agreed with the plan and demonstrated an understanding of the instructions.  A copy of instructions were sent to the patient via MyChart unless otherwise noted below.    The patient was advised to call back or seek an in-person evaluation if the symptoms worsen or if the condition fails to improve as anticipated.  Time:  I spent 10 minutes with the patient via telehealth technology discussing the above problems/concerns.    Lestine Box, PA-C

## 2021-11-24 NOTE — Patient Instructions (Signed)
Scott Phillips, thank you for joining Lestine Box, PA-C for today's virtual visit.  While this provider is not your primary care provider (PCP), if your PCP is located in our provider database this encounter information will be shared with them immediately following your visit.  Consent: (Patient) Scott Phillips provided verbal consent for this virtual visit at the beginning of the encounter.  Current Medications:  Current Outpatient Medications:    nirmatrelvir/ritonavir EUA (PAXLOVID) 20 x 150 MG & 10 x 100MG  TABS, Take 3 tablets by mouth 2 (two) times daily for 5 days. (Take nirmatrelvir 150 mg two tablets twice daily for 5 days and ritonavir 100 mg one tablet twice daily for 5 days) Patient GFR is 81.48, Disp: 30 tablet, Rfl: 0   amoxicillin-clavulanate (AUGMENTIN) 875-125 MG tablet, Take 1 tablet by mouth 2 (two) times daily., Disp: 20 tablet, Rfl: 0   b complex vitamins tablet, Take 1 tablet by mouth 3 (three) times a week., Disp: , Rfl:    Calcium-Magnesium-Zinc (CALCIUM-MAGNESUIUM-ZINC PO), Take by mouth 2 (two) times daily., Disp: , Rfl:    Carbinoxamine Maleate 4 MG TABS, Take 1 tablet (4 mg total) by mouth every 6 (six) hours as needed., Disp: 60 tablet, Rfl: 5   famotidine (PEPCID) 20 MG tablet, Take 1 tablet (20 mg total) by mouth 2 (two) times daily before a meal., Disp: 180 tablet, Rfl: 1   Fluticasone Propionate (XHANCE) 93 MCG/ACT EXHU, Place 2 puffs into the nose 2 (two) times daily as needed., Disp: 32 mL, Rfl: 5   METAMUCIL FIBER PO, Take by mouth., Disp: , Rfl:    Misc Natural Products (TURMERIC CURCUMIN) CAPS, Take 1,000 each by mouth., Disp: , Rfl:    Multiple Vitamin (MULTIVITAMIN ADULT PO), Take by mouth., Disp: , Rfl:    pantoprazole (PROTONIX) 40 MG tablet, Take 1 tablet (40 mg total) by mouth 2 (two) times daily before a meal., Disp: 180 tablet, Rfl: 3   Probiotic Product (PROBIOTIC-10 PO), Take by mouth., Disp: , Rfl:    Medications ordered in this  encounter:  Meds ordered this encounter  Medications   nirmatrelvir/ritonavir EUA (PAXLOVID) 20 x 150 MG & 10 x 100MG  TABS    Sig: Take 3 tablets by mouth 2 (two) times daily for 5 days. (Take nirmatrelvir 150 mg two tablets twice daily for 5 days and ritonavir 100 mg one tablet twice daily for 5 days) Patient GFR is 81.48    Dispense:  30 tablet    Refill:  0    Order Specific Question:   Supervising Provider    Answer:   Noemi Chapel [[id number]]     *If you need refills on other medications prior to your next appointment, please contact your pharmacy*  Follow-Up: Call back or seek an in-person evaluation if the symptoms worsen or if the condition fails to improve as anticipated.  Other Instructions COVID test was positive You should remain isolated in your home for 5 days from symptom onset AND greater than 72 hours after symptoms resolution (absence of fever without the use of fever-reducing medication and improvement in respiratory symptoms), whichever is longer Get plenty of rest and push fluids Paxlovid prescribed.  Take as directed and to completion Use zyrtec for nasal congestion, runny nose, and/or sore throat Use flonase for nasal congestion and runny nose Use medications daily for symptom relief Use OTC medications like ibuprofen or tylenol as needed fever or pain Follow up with PCP in 1-2 days via phone or  e-visit for recheck and to ensure symptoms are improving Call or go to the ED if you have any new or worsening symptoms such as fever, worsening cough, shortness of breath, chest tightness, chest pain, turning blue, changes in mental status, etc...     If you have been instructed to have an in-person evaluation today at a local Urgent Care facility, please use the link below. It will take you to a list of all of our available [facility], including address, phone number and hours of operation. Please do not delay care.  [facility]  If you or a  family member do not have a primary care provider, use the link below to schedule a visit and establish care. When you choose a [facility] primary care physician or advanced practice provider, you gain a long-term partner in health. Find a Primary Care Provider  Learn more about [facility]'s in-office and virtual care options: [facility]

## 2021-11-26 ENCOUNTER — Encounter: Payer: Self-pay | Admitting: Pulmonary Disease

## 2021-11-26 ENCOUNTER — Telehealth: Payer: Self-pay | Admitting: Pulmonary Disease

## 2021-11-26 NOTE — Telephone Encounter (Signed)
Dr Ander Slade please advise.   Called and spoke to patient in regards to him wanting a travel cpap machine.  Is this something you are okay with doing?  patient states insurance will not cover cost so patient will need to send it straight through to [facility] and pay out of pocket. He sent in forms in his last mychart message that will need to be filled out.

## 2021-11-27 ENCOUNTER — Telehealth: Payer: Self-pay | Admitting: Internal Medicine

## 2021-11-27 NOTE — Telephone Encounter (Signed)
Please advise- looks like allergy/asthma prescribed Xhance.

## 2021-11-27 NOTE — Telephone Encounter (Signed)
Dr. Ander Slade please advise on the following My Chart message,   [name] (supporting Ander Slade, Adewale A, MD) 16 hours ago (4:06 PM)   [place] My insurance won't cover it, so I am self-paying for a travel machine. My DME provider is having trouble locating one and I have some travel for work coming up soon, so I am ordering direct from the manufacturer and need you to send them a prescription, please. Attached is a copy of the form that they use to request the prescription information. I don't pretend to know anything about the setting information that they need you to fill out, but I do know that I never use the "ramp up" option on my machines. Thanks in advance for your help with this!   Regards, Scott Phillips  Attachments  [facility] Prescription Template Pre-Populated Device.pdf  Thank you

## 2021-11-27 NOTE — Telephone Encounter (Signed)
Beth would you be willing to sign the C-Pap order?

## 2021-11-27 NOTE — Telephone Encounter (Signed)
Yes, agreeable with device and any other provider can sign it

## 2021-11-27 NOTE — Telephone Encounter (Signed)
Dr. Ander Slade please review and advise on following My Chart message,   Thank you. Is there another provider that could review my history and approve this? I will be traveling for several weeks starting 12/12 and would prefer to have a travel compatible machine if possible.  Thank you

## 2021-11-27 NOTE — Telephone Encounter (Signed)
He is on auto CPAP 6-15 with 100% compliance

## 2021-11-27 NOTE — Telephone Encounter (Signed)
Pt medication expired but needs refill.   Medication:  Fluticasone Propionate [facility]) 93 MCG/ACT Laurann Montana [[id number]]  Has the patient contacted their pharmacy? No. (If no, request that the patient contact the pharmacy for the refill.) (If yes, when and what did the pharmacy advise?) Pharmacy advised pt medication expired     Preferred Pharmacy (with phone number or street name):  [facility]  [place], [place] [address]  Phone:  [phone]  Fax:  [phone]     Agent: Please be advised that RX refills may take up to 3 business days. We ask that you follow-up with your pharmacy.

## 2021-11-27 NOTE — Telephone Encounter (Signed)
Sure

## 2021-11-28 ENCOUNTER — Other Ambulatory Visit: Payer: Self-pay | Admitting: *Deleted

## 2021-11-28 MED ORDER — XHANCE 93 MCG/ACT NA EXHU: 2.0000 | INHALANT_SUSPENSION | Freq: Two times a day (BID) | NASAL | 0 refills | Status: DC | PRN

## 2021-11-28 NOTE — Telephone Encounter (Signed)
Refilled by allergy/asthma earlier today.

## 2021-12-05 DIAGNOSIS — D235 Other benign neoplasm of skin of trunk: Secondary | ICD-10-CM | POA: Diagnosis not present

## 2021-12-05 DIAGNOSIS — D408 Neoplasm of uncertain behavior of other specified male genital organs: Secondary | ICD-10-CM | POA: Diagnosis not present

## 2021-12-05 DIAGNOSIS — A630 Anogenital (venereal) warts: Secondary | ICD-10-CM | POA: Diagnosis not present

## 2021-12-10 DIAGNOSIS — D485 Neoplasm of uncertain behavior of skin: Secondary | ICD-10-CM | POA: Diagnosis not present

## 2021-12-19 DIAGNOSIS — G4733 Obstructive sleep apnea (adult) (pediatric): Secondary | ICD-10-CM | POA: Diagnosis not present

## 2021-12-24 DIAGNOSIS — G4733 Obstructive sleep apnea (adult) (pediatric): Secondary | ICD-10-CM | POA: Diagnosis not present

## 2021-12-24 NOTE — Telephone Encounter (Signed)
Please see pt email from 11/26/21. Will close encounter.

## 2021-12-28 ENCOUNTER — Telehealth: Payer: BC Managed Care – PPO | Admitting: Nurse Practitioner

## 2021-12-28 DIAGNOSIS — J0100 Acute maxillary sinusitis, unspecified: Secondary | ICD-10-CM | POA: Diagnosis not present

## 2021-12-28 MED ORDER — AMOXICILLIN-POT CLAVULANATE 875-125 MG PO TABS: 1.0000 | ORAL_TABLET | Freq: Two times a day (BID) | ORAL | 0 refills | Status: DC

## 2021-12-28 NOTE — Progress Notes (Signed)
Virtual Visit Consent   Scott Phillips, you are scheduled for a virtual visit with Mary-Margaret Hassell Done, [place], a [facility] provider, today.     Just as with appointments in the office, your consent must be obtained to participate.  Your consent will be active for this visit and any virtual visit you may have with one of our providers in the next 365 days.     If you have a MyChart account, a copy of this consent can be sent to you electronically.  All virtual visits are billed to your insurance company just like a traditional visit in the office.    As this is a virtual visit, video technology does not allow for your provider to perform a traditional examination.  This may limit your provider's ability to fully assess your condition.  If your provider identifies any concerns that need to be evaluated in person or the need to arrange testing (such as labs, EKG, etc.), we will make arrangements to do so.     Although advances in technology are sophisticated, we cannot ensure that it will always work on either your end or our end.  If the connection with a video visit is poor, the visit may have to be switched to a telephone visit.  With either a video or telephone visit, we are not always able to ensure that we have a secure connection.     I need to obtain your verbal consent now.   Are you willing to proceed with your visit today? YES   Scott Phillips has provided verbal consent on 12/28/2021 for a virtual visit (video or telephone).   Mary-Margaret Hassell Done, FNP   Date: 12/28/2021 9:02 AM   Virtual Visit via Video Note   I, Mary-Margaret Hassell Done, connected with Scott Phillips ([id number], Nov 23, 1971) on 12/28/21 at  9:00 AM EST by a video-enabled telemedicine application and verified that I am speaking with the correct person using two identifiers.  Location: Patient: Virtual Visit Location Patient: Home Provider: Virtual Visit Location Provider: Mobile   I discussed the  limitations of evaluation and management by telemedicine and the availability of in person appointments. The patient expressed understanding and agreed to proceed.    History of Present Illness: Scott Phillips is a 50 y.o. who identifies as a male who was assigned male at birth, and is being seen today for sinus infection.  HPI: Sinusitis This is a new problem. The current episode started in the past 7 days. The problem has been gradually worsening since onset. There has been no fever. His pain is at a severity of 5/10 (throat pain and ear pain). The pain is moderate. Associated symptoms include congestion, coughing, ear pain, headaches, a hoarse voice, sinus pressure and a sore throat. Pertinent negatives include no shortness of breath. Treatments tried: sudafed and tylenol. The treatment provided mild relief.   Review of Systems  HENT:  Positive for congestion, ear pain, hoarse voice, sinus pressure and sore throat.   Respiratory:  Positive for cough. Negative for shortness of breath.   Neurological:  Positive for headaches.   Problems:  Patient Active Problem List   Diagnosis Date Noted   Patellofemoral pain syndrome of right knee 01/16/2021   Perennial allergic rhinitis 11/15/2020   Chronic sinusitis 11/15/2020   Allergic conjunctivitis 11/15/2020   Annual physical exam 10/14/2020   Serrated polyp of colon 07/13/2020   PCP notes >>>>>>>>>> 07/13/2020   GERD 06/26/2020   OSA (obstructive sleep apnea)  06/26/2020    Allergies:  Allergies  Allergen Reactions   Milk-Related Compounds Other (See Comments)   Medications:  Current Outpatient Medications:    amoxicillin-clavulanate (AUGMENTIN) 875-125 MG tablet, Take 1 tablet by mouth 2 (two) times daily., Disp: 20 tablet, Rfl: 0   b complex vitamins tablet, Take 1 tablet by mouth 3 (three) times a week., Disp: , Rfl:    Calcium-Magnesium-Zinc (CALCIUM-MAGNESUIUM-ZINC PO), Take by mouth 2 (two) times daily., Disp: , Rfl:     Carbinoxamine Maleate 4 MG TABS, Take 1 tablet (4 mg total) by mouth every 6 (six) hours as needed., Disp: 60 tablet, Rfl: 5   famotidine (PEPCID) 20 MG tablet, Take 1 tablet (20 mg total) by mouth 2 (two) times daily before a meal., Disp: 180 tablet, Rfl: 1   METAMUCIL FIBER PO, Take by mouth., Disp: , Rfl:    Misc Natural Products (TURMERIC CURCUMIN) CAPS, Take 1,000 each by mouth., Disp: , Rfl:    Multiple Vitamin (MULTIVITAMIN ADULT PO), Take by mouth., Disp: , Rfl:    pantoprazole (PROTONIX) 40 MG tablet, Take 1 tablet (40 mg total) by mouth 2 (two) times daily before a meal., Disp: 180 tablet, Rfl: 3   Probiotic Product (PROBIOTIC-10 PO), Take by mouth., Disp: , Rfl:    XHANCE 93 MCG/ACT EXHU, Place 2 puffs into the nose 2 (two) times daily as needed., Disp: 32 mL, Rfl: 0  Observations/Objective: Patient is well-developed, well-nourished in no acute distress.  Resting comfortably  at home.  Head is normocephalic, atraumatic.  No labored breathing.  Speech is clear and coherent with logical content.  Patient is alert and oriented at baseline.  Voice hoarse No cough during visit  Assessment and Plan:  Scott Phillips in today with chief complaint of Sinusitis   1. Acute non-recurrent maxillary sinusitis 1. Take meds as prescribed 2. Use a cool mist humidifier especially during the winter months and when heat has been humid. 3. Use saline nose sprays frequently 4. Saline irrigations of the nose can be very helpful if done frequently.  * 4X daily for 1 week*  * Use of a nettie pot can be helpful with this. Follow directions with this* 5. Drink plenty of fluids 6. Keep thermostat turn down low 7.For any cough or congestion- robitussin DM 8. For fever or aces or pains- take tylenol or ibuprofen appropriate for age and weight.  * for fevers greater than 101 orally you may alternate ibuprofen and tylenol every  3 hours.   Meds ordered this encounter  Medications    amoxicillin-clavulanate (AUGMENTIN) 875-125 MG tablet    Sig: Take 1 tablet by mouth 2 (two) times daily.    Dispense:  14 tablet    Refill:  0    Order Specific Question:   Supervising Provider    Answer:   Noemi Chapel [[id number]]       Follow Up Instructions: I discussed the assessment and treatment plan with the patient. The patient was provided an opportunity to ask questions and all were answered. The patient agreed with the plan and demonstrated an understanding of the instructions.  A copy of instructions were sent to the patient via MyChart.  The patient was advised to call back or seek an in-person evaluation if the symptoms worsen or if the condition fails to improve as anticipated.  Time:  I spent 8 minutes with the patient via telehealth technology discussing the above problems/concerns.    Mary-Margaret Hassell Done, FNP

## 2021-12-28 NOTE — Patient Instructions (Signed)

## 2021-12-30 ENCOUNTER — Other Ambulatory Visit: Payer: Self-pay | Admitting: *Deleted

## 2022-01-02 DIAGNOSIS — A630 Anogenital (venereal) warts: Secondary | ICD-10-CM | POA: Diagnosis not present

## 2022-01-07 ENCOUNTER — Other Ambulatory Visit: Payer: Self-pay

## 2022-01-07 ENCOUNTER — Encounter: Payer: Self-pay | Admitting: Family

## 2022-01-07 ENCOUNTER — Ambulatory Visit: Payer: BC Managed Care – PPO | Admitting: Family

## 2022-01-07 VITALS — BP 123/82 | HR 91 | Temp 98.1°F | Ht 68.0 in | Wt 222.0 lb

## 2022-01-07 DIAGNOSIS — J320 Chronic maxillary sinusitis: Secondary | ICD-10-CM | POA: Diagnosis not present

## 2022-01-07 MED ORDER — SULFAMETHOXAZOLE-TRIMETHOPRIM 800-160 MG PO TABS: 1.0000 | ORAL_TABLET | Freq: Two times a day (BID) | ORAL | 0 refills | Status: DC

## 2022-01-07 NOTE — Assessment & Plan Note (Signed)
pt reports long history of infections, nasal surgery, states he completed Augmentin but 2 days later started having sx again, left ear w/otitis media. he reports past ENT has given him Bactrim when 2nd round of abt required and this usually gets rid of infection. Advised to continue home sinus meds, drink at least 2L water qd.

## 2022-01-07 NOTE — Progress Notes (Signed)
Subjective:     Patient ID: Scott Phillips, male    DOB: 1971/07/08, 51 y.o.   MRN: [id number]  Chief Complaint  Patient presents with   Sinusitis   Nasal Congestion    Pt was treated by [facility] on 12/28/2021.  He was given ABX. He complains of continued symptoms. He denies fever or pain.    Sore Throat    Due to cough   Cough    HPI: Sinusitis: Patient complains of congestion, facial pain, headache described as frontal, nasal congestion, post nasal drip, and sinus pressure, with no fever, chills, night sweats or weight loss. Onset of symptoms was 14 days ago, gradually worsening since that time. He is drinking moderate amounts of fluids.  Past history is significant for no history of pneumonia or bronchitis. Patient is non-smoker. reports hx of recurrent sinus infections and nasal surgery.  Health Maintenance Due  Topic Date Due   Pneumococcal Vaccine 81-47 Years old (2 - PCV) 11/24/2012    Past Medical History:  Diagnosis Date   Allergic rhinosinusitis    Allergy    Arthritis    Colon polyps    Depression    Diverticulitis    GERD (gastroesophageal reflux disease)    Sinusitis    Sleep apnea     Past Surgical History:  Procedure Laterality Date   CARPAL TUNNEL RELEASE     KNEE ARTHROSCOPY Right 1991   cyst removal   MAXILLARY ANTROSTOMY Right 06/29/2020   Procedure: RIGHT MAXILLARY ANTROSTOMY WITH TISSUE REMOVAL;  Surgeon: Leta Baptist, MD;  Location: [facility];  Service: ENT;  Laterality: Right;   NASAL SINUS SURGERY     x3 in [place]   SUBACROMIAL DECOMPRESSION Left    TRIGGER FINGER RELEASE Right 2018   R 3rd   VASECTOMY  2019    Outpatient Medications Prior to Visit  Medication Sig Dispense Refill   b complex vitamins tablet Take 1 tablet by mouth 3 (three) times a week.     Calcium-Magnesium-Zinc (CALCIUM-MAGNESUIUM-ZINC PO) Take by mouth 2 (two) times daily.     Carbinoxamine Maleate 4 MG TABS Take 1 tablet (4 mg total) by mouth  every 6 (six) hours as needed. 60 tablet 5   famotidine (PEPCID) 20 MG tablet Take 1 tablet (20 mg total) by mouth 2 (two) times daily before a meal. 180 tablet 1   METAMUCIL FIBER PO Take by mouth.     Misc Natural Products (TURMERIC CURCUMIN) CAPS Take 1,000 each by mouth.     Multiple Vitamin (MULTIVITAMIN ADULT PO) Take by mouth.     pantoprazole (PROTONIX) 40 MG tablet Take 1 tablet (40 mg total) by mouth 2 (two) times daily before a meal. 180 tablet 3   Probiotic Product (PROBIOTIC-10 PO) Take by mouth.     XHANCE 93 MCG/ACT EXHU Place 2 puffs into the nose 2 (two) times daily as needed. 32 mL 0   amoxicillin-clavulanate (AUGMENTIN) 875-125 MG tablet Take 1 tablet by mouth 2 (two) times daily. 14 tablet 0   No facility-administered medications prior to visit.    Allergies  Allergen Reactions   Milk-Related Compounds Other (See Comments)        Objective:    Physical Exam Vitals and nursing note reviewed.  Constitutional:      General: He is not in acute distress.    Appearance: Normal appearance.  HENT:     Head: Normocephalic.     Right Ear: Tympanic membrane and  ear canal normal.     Left Ear: Ear canal normal. A middle ear effusion is present. Tympanic membrane is erythematous.     Nose:     Right Sinus: Maxillary sinus tenderness present.     Left Sinus: Maxillary sinus tenderness present.  Cardiovascular:     Rate and Rhythm: Normal rate and regular rhythm.  Pulmonary:     Effort: Pulmonary effort is normal.     Breath sounds: Normal breath sounds.  Musculoskeletal:        General: Normal range of motion.     Cervical back: Normal range of motion.  Lymphadenopathy:     Cervical: No cervical adenopathy.  Skin:    General: Skin is warm and dry.  Neurological:     Mental Status: He is alert and oriented to person, place, and time.  Psychiatric:        Mood and Affect: Mood normal.    BP 123/82    Pulse 91    Temp 98.1 F (36.7 C) (Temporal)    Ht 5\' 8"   (1.727 m)    Wt 222 lb (100.7 kg)    SpO2 97%    BMI 33.75 kg/m  Wt Readings from Last 3 Encounters:  01/07/22 222 lb (100.7 kg)  11/15/21 213 lb 6.4 oz (96.8 kg)  11/01/21 209 lb (94.8 kg)       Assessment & Plan:   Problem List Items Addressed This Visit       Respiratory   Chronic sinusitis - Primary    pt reports long history of infections, nasal surgery, states he completed Augmentin but 2 days later started having sx again, left ear w/otitis media. he reports past ENT has given him Bactrim when 2nd round of abt required and this usually gets rid of infection. Advised to continue home sinus meds, drink at least 2L water qd.      Relevant Medications   sulfamethoxazole-trimethoprim (BACTRIM DS) 800-160 MG tablet    Meds ordered this encounter  Medications   sulfamethoxazole-trimethoprim (BACTRIM DS) 800-160 MG tablet    Sig: Take 1 tablet by mouth 2 (two) times daily after a meal.    Dispense:  14 tablet    Refill:  0    Order Specific Question:   Supervising Provider    Answer:   ANDY, CAMILLE L [[id number]]

## 2022-02-12 DIAGNOSIS — A630 Anogenital (venereal) warts: Secondary | ICD-10-CM | POA: Diagnosis not present

## 2022-02-20 ENCOUNTER — Ambulatory Visit: Payer: BC Managed Care – PPO

## 2022-03-27 ENCOUNTER — Encounter: Payer: Self-pay | Admitting: Family Medicine

## 2022-03-27 ENCOUNTER — Ambulatory Visit: Payer: BC Managed Care – PPO | Admitting: Family Medicine

## 2022-03-27 ENCOUNTER — Telehealth: Payer: Self-pay

## 2022-03-27 VITALS — BP 123/75 | HR 71 | Resp 20 | Ht 68.0 in | Wt 223.0 lb

## 2022-03-27 DIAGNOSIS — M79671 Pain in right foot: Secondary | ICD-10-CM | POA: Diagnosis not present

## 2022-03-27 MED ORDER — MELOXICAM 15 MG PO TABS: 15.0000 mg | ORAL_TABLET | Freq: Every day | ORAL | 1 refills | Status: DC

## 2022-03-27 NOTE — Patient Instructions (Addendum)
Likely flare of tendinitis and plantar fasciitis given all of the walking last week.  ?Go ahead and start wearing your brace with activity and for light compression, ice a few times per day, elevate as much as able, continue home exercises/stretches you have been doing. Adding meloxicam as an antiinflammatory and pain relief (do not combine with any other NSAIDs like ibuprofen, Aleve, etc - tylenol is okay to use).  ?If not improving in the next several weeks, we can send you to sports medicine (and PT if you are interested). Please let us know.  ? ?Please contact office for follow-up if symptoms do not improve or worsen. Seek emergency care if symptoms become severe. ? ?

## 2022-03-27 NOTE — Telephone Encounter (Signed)
Nurse Assessment ?Nurse: Self, RN, Nira Conn Date/Time (Eastern Time): 03/27/2022 8:08:57 AM ?Confirm and document reason for call. If ?symptomatic, describe symptoms. ?---Caller says Right Ankle Pain , unknown injury . Has ?some swelling . Pain is 10/10 with weight on it. ?Does the patient have any new or worsening ?symptoms? ---Yes ?Will a triage be completed? ---Yes ?Related visit to physician within the last 2 weeks? ---No ?Does the PT have any chronic conditions? (i.e. ?diabetes, asthma, this includes High risk factors for ?pregnancy, etc.) ?---No ?Is this a behavioral health or substance abuse call? ---No ?Guidelines ?Guideline Title Affirmed Question Affirmed Notes Nurse Date/Time (Eastern ?Time) ?Ankle Pain [1] SEVERE pain ?(e.g., excruciating, ?unable to walk) AND ?[2] not improved ?after 2 hours of pain ?medicine ?Self, RN, Heather 03/27/2022 8:10:13 ?AM ?Disp. Time (Eastern ?Time) Disposition Final User ?03/27/2022 8:11:07 AM See HCP within 4 Hours (or ?PCP triage) ?Yes Self, RN, Heather ?PLEASE NOTE: All timestamps contained within this report are represented as [place] Standard Time. ?CONFIDENTIALTY NOTICE: This fax transmission is intended only for the addressee. It contains information that is legally privileged, confidential or ?otherwise protected from use or disclosure. If you are not the intended recipient, you are strictly prohibited from reviewing, disclosing, copying using ?or disseminating any of this information or taking any action in reliance on or regarding this information. If you have received this fax in error, please ?notify us immediately by telephone so that we can arrange for its return to [place]. Phone: [phone], Toll-Free: [phone], Fax: [phone] ?Page: 2 of 2 ?Call Id: [id number] ?Caller Disagree/Comply Comply ?Caller Understands Yes ?PreDisposition Call Doctor ?Care Advice Given Per Guideline ?SEE HCP (OR PCP TRIAGE) WITHIN 4 HOURS: * IF OFFICE WILL BE OPEN: You need to be seen  within the next 3 or 4 ?hours. Call your doctor (or NP/PA) now or as soon as the office opens. PAIN MEDICINES: CALL BACK IF: * You become worse ?CARE ADVICE given per Ankle Pain (Adult) guideline. ?Referrals ?REFERRED TO PCP OFFICE ?

## 2022-03-27 NOTE — Progress Notes (Signed)
? ?Acute Office Visit ? ?Subjective:  ? ? Patient ID: Scott Phillips, male    DOB: May 09, 1971, 51 y.o.   MRN: [id number] ? ?Chief Complaint  ?Patient presents with  ? Ankle Pain  ?  Right ankle, started Sunday, no injury ?Pain in heel and ankle  ? ? ?RIGHT FOOT PAIN ?Patient states that he was in [place] with his family last week and did a lot of walking Tuesday through Thursday.  On Friday they drove home and then by Sunday he noticed his right foot was very painful.  Reports that he has a history of Achilles tendinitis and plantar fasciitis from many years ago that will occasionally flareup.  Flares usually improve with stretches, however he has been trying those this time but it has not made much difference.  Reports symptoms started fairly mild but have gradually progressed.  At rest he has 1/10 discomfort to the Achilles and plantar fascia, increasing to 10/10 with walking.  He has started using a cane for support with walking.  Reports last night he did start noticing some mild swelling to his posterior foot.  He had used a brace in the past that worked well but he could not find it, so he ordered a new one which should be delivered today or tomorrow.  Pain is localized to the right Achilles and right plantar fascia. ? ? ? ? ?Past Medical History:  ?Diagnosis Date  ? Allergic rhinosinusitis   ? Allergy   ? Arthritis   ? Colon polyps   ? Depression   ? Diverticulitis   ? GERD (gastroesophageal reflux disease)   ? Sinusitis   ? Sleep apnea   ? ? ?Past Surgical History:  ?Procedure Laterality Date  ? CARPAL TUNNEL RELEASE    ? KNEE ARTHROSCOPY Right 1991  ? cyst removal  ? MAXILLARY ANTROSTOMY Right 06/29/2020  ? Procedure: RIGHT MAXILLARY ANTROSTOMY WITH TISSUE REMOVAL;  Surgeon: Leta Baptist, MD;  Location: [facility];  Service: ENT;  Laterality: Right;  ? NASAL SINUS SURGERY    ? x3 in [place]  ? SUBACROMIAL DECOMPRESSION Left   ? TRIGGER FINGER RELEASE Right 2018  ? R 3rd  ? VASECTOMY   2019  ? ? ?Family History  ?Problem Relation Age of Onset  ? Cancer Mother   ? Depression Mother   ? Hypertension Mother   ? Miscarriages / [place] Mother   ? Cancer Father   ? Alcohol abuse Father   ? Arthritis Father   ? Diabetes Father   ? Hearing loss Father   ? Allergic rhinitis Sister   ? Depression Maternal Grandmother   ? Hearing loss Maternal Grandfather   ? Asthma Maternal Grandfather   ? Diabetes Paternal Grandfather   ? Colon cancer Neg Hx   ? Prostate cancer Neg Hx   ? ? ?Social History  ? ?Socioeconomic History  ? Marital status: Married  ?  Spouse name: Not on file  ? Number of children: 1  ? Years of education: Not on file  ? Highest education level: Not on file  ?Occupational History  ? Occupation: IT  ? Occupation: works from home  ?Tobacco Use  ? Smoking status: Former  ?  Packs/day: 2.00  ?  Types: Cigarettes  ?  Start date: 12/30/1991  ?  Quit date: 12/29/2014  ?  Years since quitting: 7.2  ? Smokeless tobacco: Never  ?Vaping Use  ? Vaping Use: Never used  ?Substance and Sexual  Activity  ? Alcohol use: Yes  ?  Comment: occas  ? Drug use: Never  ? Sexual activity: Not on file  ?Other Topics Concern  ? Not on file  ?Social History Narrative  ? Daughter born  2018  ? ?Social Determinants of Health  ? ?Financial Resource Strain: Not on file  ?Food Insecurity: Not on file  ?Transportation Needs: Not on file  ?Physical Activity: Not on file  ?Stress: Not on file  ?Social Connections: Not on file  ?Intimate Partner Violence: Not on file  ? ? ?Outpatient Medications Prior to Visit  ?Medication Sig Dispense Refill  ? b complex vitamins tablet Take 1 tablet by mouth 3 (three) times a week.    ? Calcium-Magnesium-Zinc (CALCIUM-MAGNESUIUM-ZINC PO) Take by mouth 2 (two) times daily.    ? Carbinoxamine Maleate 4 MG TABS Take 1 tablet (4 mg total) by mouth every 6 (six) hours as needed. 60 tablet 5  ? famotidine (PEPCID) 20 MG tablet Take 1 tablet (20 mg total) by mouth 2 (two) times daily before a meal. 180  tablet 1  ? METAMUCIL FIBER PO Take by mouth.    ? Misc Natural Products (TURMERIC CURCUMIN) CAPS Take 1,000 each by mouth.    ? Multiple Vitamin (MULTIVITAMIN ADULT PO) Take by mouth.    ? pantoprazole (PROTONIX) 40 MG tablet Take 1 tablet (40 mg total) by mouth 2 (two) times daily before a meal. 180 tablet 3  ? Probiotic Product (PROBIOTIC-10 PO) Take by mouth.    ? sulfamethoxazole-trimethoprim (BACTRIM DS) 800-160 MG tablet Take 1 tablet by mouth 2 (two) times daily after a meal. 14 tablet 0  ? XHANCE 93 MCG/ACT EXHU Place 2 puffs into the nose 2 (two) times daily as needed. 32 mL 0  ? amoxicillin-clavulanate (AUGMENTIN) 875-125 MG tablet Take 1 tablet by mouth 2 (two) times daily. 14 tablet 0  ? ?No facility-administered medications prior to visit.  ? ? ?Allergies  ?Allergen Reactions  ? Milk-Related Compounds Other (See Comments)  ? ? ?Review of Systems ?All review of systems negative except what is listed in the HPI ? ?   ?Objective:  ?  ?Physical Exam ?Vitals reviewed.  ?Constitutional:   ?   Appearance: Normal appearance.  ?Musculoskeletal:     ?   General: Normal range of motion.  ?   Comments: Right achilles and plantar fascia with tenderness to palpation, scant edema; no bony tenderness, bruising, erythema, or loss of ROM  ?Skin: ?   General: Skin is warm and dry.  ?   Capillary Refill: Capillary refill takes less than 2 seconds.  ?   Findings: No bruising, erythema or lesion.  ?Neurological:  ?   General: No focal deficit present.  ?   Mental Status: He is alert and oriented to person, place, and time. Mental status is at baseline.  ?Psychiatric:     ?   Mood and Affect: Mood normal.     ?   Behavior: Behavior normal.     ?   Thought Content: Thought content normal.     ?   Judgment: Judgment normal.  ? ? ?BP 123/75 (BP Location: Left Arm, Patient Position: Sitting, Cuff Size: Normal)   Pulse 71   Resp 20   Ht '5\' 8"'$  (1.727 m)   Wt 223 lb (101.2 kg)   SpO2 100%   BMI 33.91 kg/m?  ?Wt Readings from  Last 3 Encounters:  ?03/27/22 223 lb (101.2 kg)  ?01/07/22 222 lb (  100.7 kg)  ?11/15/21 213 lb 6.4 oz (96.8 kg)  ? ? ?There are no preventive care reminders to display for this patient. ? ?There are no preventive care reminders to display for this patient. ? ? ?Lab Results  ?Component Value Date  ? TSH 2.59 10/03/2020  ? ?Lab Results  ?Component Value Date  ? WBC 6.4 10/14/2021  ? HGB 13.5 10/14/2021  ? HCT 40.1 10/14/2021  ? MCV 93.4 10/14/2021  ? PLT 183.0 10/14/2021  ? ?Lab Results  ?Component Value Date  ? NA 138 10/14/2021  ? K 4.8 10/14/2021  ? CO2 29 10/14/2021  ? GLUCOSE 93 10/14/2021  ? BUN 18 10/14/2021  ? CREATININE 1.08 10/14/2021  ? BILITOT 1.2 10/14/2021  ? ALKPHOS 49 10/14/2021  ? AST 19 10/14/2021  ? ALT 23 10/14/2021  ? PROT 6.7 10/14/2021  ? ALBUMIN 4.3 10/14/2021  ? CALCIUM 9.7 10/14/2021  ? GFR 81.48 10/14/2021  ? ?Lab Results  ?Component Value Date  ? CHOL 158 10/14/2021  ? ?Lab Results  ?Component Value Date  ? HDL 48.70 10/14/2021  ? ?Lab Results  ?Component Value Date  ? [place] 85 10/14/2021  ? ?Lab Results  ?Component Value Date  ? TRIG 119.0 10/14/2021  ? ?Lab Results  ?Component Value Date  ? CHOLHDL 3 10/14/2021  ? ?No results found for: HGBA1C ? ?   ?Assessment & Plan:  ? ?1. Right foot pain ?Likely flare of tendinitis and plantar fasciitis given all of the walking last week.  ?Go ahead and start wearing your brace with activity and for light compression, ice a few times per day, elevate as much as able, continue home exercises/stretches you have been doing. Adding meloxicam as an antiinflammatory and pain relief (do not combine with any other NSAIDs like ibuprofen, Aleve, etc - tylenol is okay to use).  ?If not improving in the next several weeks, we can send you to sports medicine (and PT if you are interested). Please let us know.  ? ?- meloxicam (MOBIC) 15 MG tablet; Take 1 tablet (15 mg total) by mouth daily.  Dispense: 30 tablet; Refill: 1 ? ? ?Please contact office for follow-up  if symptoms do not improve or worsen. Seek emergency care if symptoms become severe. ? ? ?Terrilyn Saver, NP ? ?

## 2022-04-01 DIAGNOSIS — J0101 Acute recurrent maxillary sinusitis: Secondary | ICD-10-CM | POA: Diagnosis not present

## 2022-04-01 DIAGNOSIS — J310 Chronic rhinitis: Secondary | ICD-10-CM | POA: Diagnosis not present

## 2022-04-01 DIAGNOSIS — J343 Hypertrophy of nasal turbinates: Secondary | ICD-10-CM | POA: Diagnosis not present

## 2022-04-22 NOTE — Progress Notes (Signed)
? ?[place] [id number] ?Dept: [phone] ? ?FOLLOW UP NOTE ? ?Patient ID: Scott Phillips, male    DOB: [birth year]  Age: 51 y.o. MRN: [id number] ?Date of Office Visit: 04/23/2022 ? ?Assessment  ?Chief Complaint: Follow-up (Pt is present due to refills on his medications.) ? ?HPI ?TODDY Phillips is a 51 year old male who presents to the clinic for follow-up visit.  He was last seen in this clinic on 11/15/2020 by Dr. Verlin Fester for evaluation of allergic rhinitis, allergic conjunctivitis, and chronic sinusitis.  His history includes right maxillary antrostomy on 06/29/2020 with Dr. Benjamine Mola.  At today's visit, he reports his allergic rhinitis has been moderately well controlled with symptoms including occasional clear rhinorrhea, nasal congestion, and occasional postnasal drainage.  He continues Xhance 1 spray in each nostril once a day, nasal saline rinses, and carbinoxamine 4 mg tablets twice a day.  He reports that he continues to experience about 1-2 sinus infections each year requiring antibiotics for relief of symptoms.  His last allergy skin testing was on 11/15/2020 and was positive to mold mix 2, mold mix 3, and mold mix 4.  Allergic conjunctivitis is reported as moderately well controlled with occasional red and itchy eyes for which he uses over-the-counter Visine allergy with relief of symptoms.  His current medications are listed in the chart. ? ? ?Drug Allergies:  ?Allergies  ?Allergen Reactions  ? Milk-Related Compounds Other (See Comments)  ? ? ?Physical Exam: ?BP 118/80   Pulse 78   Temp 97.8 ?F (36.6 ?C) (Temporal)   Resp 20   Ht '5\' 7"'$  (1.702 m)   Wt 218 lb 14.4 oz (99.3 kg)   SpO2 96%   BMI 34.28 kg/m?   ? ?Physical Exam ?Vitals reviewed.  ?Constitutional:   ?   Appearance: Normal appearance.  ?HENT:  ?   Head: Normocephalic and atraumatic.  ?   Right Ear: Tympanic membrane normal.  ?   Left Ear: Tympanic membrane normal.  ?   Nose:  ?   Comments: Bilateral nares slightly  erythematous with clear nasal drainage noted.  Pharynx normal.  Ears normal.  Eyes normal. ?   Mouth/Throat:  ?   Pharynx: Oropharynx is clear.  ?Eyes:  ?   Conjunctiva/sclera: Conjunctivae normal.  ?Cardiovascular:  ?   Rate and Rhythm: Normal rate and regular rhythm.  ?   Heart sounds: Normal heart sounds. No murmur heard. ?Pulmonary:  ?   Effort: Pulmonary effort is normal.  ?   Breath sounds: Normal breath sounds.  ?   Comments: Lungs clear to auscultation ?Musculoskeletal:     ?   General: Normal range of motion.  ?   Cervical back: Normal range of motion and neck supple.  ?Skin: ?   General: Skin is warm and dry.  ?Neurological:  ?   Mental Status: He is alert and oriented to person, place, and time.  ?Psychiatric:     ?   Mood and Affect: Mood normal.     ?   Behavior: Behavior normal.     ?   Thought Content: Thought content normal.     ?   Judgment: Judgment normal.  ? ? ?Assessment and Plan: ?1. Perennial allergic rhinitis   ?2. Allergic conjunctivitis of both eyes   ?3. Chronic maxillary sinusitis   ? ? ?Meds ordered this encounter  ?Medications  ? XHANCE 93 MCG/ACT EXHU  ?  Sig: Place 2 puffs into the nose 2 (two)  times daily as needed.  ?  Dispense:  32 mL  ?  Refill:  3  ? Carbinoxamine Maleate 4 MG TABS  ?  Sig: Take 1 tablet (4 mg total) by mouth every 6 (six) hours as needed.  ?  Dispense:  60 tablet  ?  Refill:  5  ? ? ?Patient Instructions  ?Allergic rhinitis ?Continue allergen avoidance measures directed toward mold as listed below ?Continue carbinoxamine 4 mg tablets once every 6 hours as needed for nasal symptoms ?Continue Xhance 2 sprays in each nostril twice a day for nasal congestion ?Consider saline nasal rinses as needed for nasal symptoms. Use this before any medicated nasal sprays for best result ? ?Allergic conjunctivitis ?Some over the counter eye drops include Pataday one drop in each eye once a day as needed for red, itchy eyes OR Zaditor one drop in each eye twice a day as needed  for red itchy eyes. ? ?Chronic sinusitis ?Keep track of infections and antibiotic use ?For thick postnasal drainage, begin Mucinex 600 to 1200 mg twice a day and increase fluid intake as tolerated ? ?Call the clinic if this treatment plan is not working well for you. ? ?Follow up in 1 year or sooner if needed. ? ? ?Return in about 1 year (around 04/24/2023), or if symptoms worsen or fail to improve. ?  ? ?Thank you for the opportunity to care for this patient.  Please do not hesitate to contact me with questions. ? ?Gareth Morgan, FNP ?[facility] ? ? ? ? ? ?

## 2022-04-22 NOTE — Patient Instructions (Addendum)
Allergic rhinitis ?Continue allergen avoidance measures directed toward mold as listed below ?Continue carbinoxamine 4 mg tablets once every 6 hours as needed for nasal symptoms ?Continue Xhance 2 sprays in each nostril twice a day for nasal congestion ?Consider saline nasal rinses as needed for nasal symptoms. Use this before any medicated nasal sprays for best result ? ?Allergic conjunctivitis ?Some over the counter eye drops include Pataday one drop in each eye once a day as needed for red, itchy eyes OR Zaditor one drop in each eye twice a day as needed for red itchy eyes. ? ?Chronic sinusitis ?Keep track of infections and antibiotic use ?For thick postnasal drainage, begin Mucinex 600 to 1200 mg twice a day and increase fluid intake as tolerated ? ?Call the clinic if this treatment plan is not working well for you. ? ?Follow up in 1 year or sooner if needed. ? ?Control of Mold Allergen ?Mold and fungi can grow on a variety of surfaces provided certain temperature and moisture conditions exist.  Outdoor molds grow on plants, decaying vegetation and soil.  The major outdoor mold, Alternaria and Cladosporium, are found in very high numbers during hot and dry conditions.  Generally, a late Summer - Fall peak is seen for common outdoor fungal spores.  Rain will temporarily lower outdoor mold spore count, but counts rise rapidly when the rainy period ends.  The most important indoor molds are Aspergillus and Penicillium.  Dark, humid and poorly ventilated basements are ideal sites for mold growth.  The next most common sites of mold growth are the bathroom and the kitchen. ? ?Outdoor [employer] ?Use air conditioning and keep windows closed ?Avoid exposure to decaying vegetation. ?Avoid leaf raking. ?Avoid grain handling. ?Consider wearing a face mask if working in moldy areas. ? ?Indoor Mold Control ?Maintain humidity below 50%. ?Clean washable surfaces with 5% bleach solution. ?Remove sources e.g. Contaminated  carpets. ? ?

## 2022-04-23 ENCOUNTER — Ambulatory Visit (INDEPENDENT_AMBULATORY_CARE_PROVIDER_SITE_OTHER): Payer: BC Managed Care – PPO | Admitting: Family Medicine

## 2022-04-23 ENCOUNTER — Encounter: Payer: Self-pay | Admitting: Family Medicine

## 2022-04-23 VITALS — BP 118/80 | HR 78 | Temp 97.8°F | Resp 20 | Ht 67.0 in | Wt 218.9 lb

## 2022-04-23 DIAGNOSIS — H1013 Acute atopic conjunctivitis, bilateral: Secondary | ICD-10-CM | POA: Diagnosis not present

## 2022-04-23 DIAGNOSIS — J3089 Other allergic rhinitis: Secondary | ICD-10-CM | POA: Diagnosis not present

## 2022-04-23 DIAGNOSIS — J320 Chronic maxillary sinusitis: Secondary | ICD-10-CM

## 2022-04-23 MED ORDER — XHANCE 93 MCG/ACT NA EXHU: 2.0000 | INHALANT_SUSPENSION | Freq: Two times a day (BID) | NASAL | 3 refills | Status: DC | PRN

## 2022-04-23 MED ORDER — CARBINOXAMINE MALEATE 4 MG PO TABS: 4.0000 mg | ORAL_TABLET | Freq: Four times a day (QID) | ORAL | 5 refills | Status: DC | PRN

## 2022-06-11 DIAGNOSIS — A630 Anogenital (venereal) warts: Secondary | ICD-10-CM | POA: Diagnosis not present

## 2022-06-27 DIAGNOSIS — G4733 Obstructive sleep apnea (adult) (pediatric): Secondary | ICD-10-CM | POA: Diagnosis not present

## 2022-07-02 ENCOUNTER — Other Ambulatory Visit: Payer: Self-pay | Admitting: *Deleted

## 2022-07-02 MED ORDER — PANTOPRAZOLE SODIUM 40 MG PO TBEC: 40.0000 mg | DELAYED_RELEASE_TABLET | Freq: Two times a day (BID) | ORAL | 3 refills | Status: DC

## 2022-07-10 ENCOUNTER — Ambulatory Visit: Payer: BC Managed Care – PPO | Admitting: Internal Medicine

## 2022-07-10 ENCOUNTER — Encounter: Payer: Self-pay | Admitting: Internal Medicine

## 2022-07-10 VITALS — BP 104/82 | HR 77 | Temp 97.9°F | Resp 18 | Ht 67.0 in

## 2022-07-10 DIAGNOSIS — L720 Epidermal cyst: Secondary | ICD-10-CM | POA: Diagnosis not present

## 2022-07-10 DIAGNOSIS — M545 Low back pain, unspecified: Secondary | ICD-10-CM | POA: Diagnosis not present

## 2022-07-10 DIAGNOSIS — L738 Other specified follicular disorders: Secondary | ICD-10-CM | POA: Diagnosis not present

## 2022-07-10 MED ORDER — PREDNISONE 10 MG PO TABS: ORAL_TABLET | ORAL | 0 refills | Status: DC

## 2022-07-10 MED ORDER — CYCLOBENZAPRINE HCL 10 MG PO TABS: 10.0000 mg | ORAL_TABLET | Freq: Two times a day (BID) | ORAL | 0 refills | Status: DC | PRN

## 2022-07-10 NOTE — Progress Notes (Signed)
Subjective:    Patient ID: Scott Phillips, male    DOB: [birth year], 51 y.o.   MRN: [id number]  DOS:  07/10/2022 Type of visit - description: Acute  Was working out today, lifting weights. In the middle of his routine he felt a mild right back pain. At the end of the routine the pain becomes severe with some radiation to the posterior aspect of the right leg up to the knee. He was in the floor, had a very hard time getting up . He tried to ambulate but " the leg fell out of under me".  He has tried a heating pad which helped, also a leftover meloxicam.  This is the first time something like this happen on the right side, previously had some left back pain. No bladder or bowel incontinence No motor deficits No lower extremity paresthesias No LUTS.   Review of Systems See above   Past Medical History:  Diagnosis Date   Allergic rhinosinusitis    Allergy    Arthritis    Colon polyps    Depression    Diverticulitis    GERD (gastroesophageal reflux disease)    Sinusitis    Sleep apnea     Past Surgical History:  Procedure Laterality Date   CARPAL TUNNEL RELEASE     KNEE ARTHROSCOPY Right 1991   cyst removal   MAXILLARY ANTROSTOMY Right 06/29/2020   Procedure: RIGHT MAXILLARY ANTROSTOMY WITH TISSUE REMOVAL;  Surgeon: Leta Baptist, MD;  Location: [facility];  Service: ENT;  Laterality: Right;   NASAL SINUS SURGERY     x3 in [place]   SUBACROMIAL DECOMPRESSION Left    TRIGGER FINGER RELEASE Right 2018   R 3rd   VASECTOMY  2019    Current Outpatient Medications  Medication Instructions   b complex vitamins tablet 1 tablet, Oral, 3 times weekly   Calcium-Magnesium-Zinc (CALCIUM-MAGNESUIUM-ZINC PO) Oral, 2 times daily   Carbinoxamine Maleate 4 mg, Oral, Every 6 hours PRN   famotidine (PEPCID) 20 mg, Oral, 2 times daily before meals   imiquimod (ALDARA) 5 % cream SMARTSIG:1 Topical Daily   METAMUCIL FIBER PO Oral   Misc Natural Products (TURMERIC  CURCUMIN) CAPS 1,000 each, Oral   Multiple Vitamin (MULTIVITAMIN ADULT PO) Oral   pantoprazole (PROTONIX) 40 mg, Oral, 2 times daily before meals   Probiotic Product (PROBIOTIC-10 PO) Oral   pseudoephedrine (SUDAFED) 120 MG 12 hr tablet No dose, route, or frequency recorded.   XHANCE 93 MCG/ACT EXHU 2 puffs, Nasal, 2 times daily PRN       Objective:   Physical Exam BP 104/82 (BP Location: Left Arm, Patient Position: Sitting, Cuff Size: Normal)   Pulse 77   Temp 97.9 F (36.6 C) (Oral)   Resp 18   Ht '5\' 7"'$  (1.702 m)   SpO2 99%   BMI 34.28 kg/m  General:   Well developed, + antalgic posture and gait.   was brought to the room on a wheelchair to facilitate transportation but he is able to walk short distances.   HEENT:  Normocephalic . Face symmetric, atraumatic Abdomen: Soft, nontender Lower extremities: no pretibial edema bilaterally  Skin: Not pale. Not jaundice.  No rash Neurologic:  alert & oriented X3.  Speech normal, gait appropriate for age and unassisted Motor: Symmetric and normal DTRs: Symmetric and normal Straight leg test negative. Psych--  Cognition and judgment appear intact.  Cooperative with normal attention span and concentration.  Behavior appropriate. No anxious or depressed  appearing.      Assessment    ASSESSMENT New patient 06-2020, moved from [place]. OSA , dx ~2010, +CPAP GERD: had EGD in [place] per pt remotely (2006?) Colon Polyps H/o Diverticulitis  H/o Depression Allergies  Sinus issues: Surgery 2012, surgery 06/2020   PLAN Back sprain: back sprain as described above, motor exam negative, no red flag symptoms. We agreed on conservative treatment with a round of prednisone, Flexeril, Tylenol, heating pad. Once you finish prednisone okay to use meloxicam, he has a leftover from a different issue. Call if not gradually better, call if motor deficits, increased pain or paresthesias. He could do some physical therapy so desired, he will  let me know.

## 2022-07-10 NOTE — Patient Instructions (Signed)
Take prednisone as prescribed  Use a muscle relaxant, Flexeril, twice a day as needed.  Watch for drowsiness  Tylenol  500 mg OTC 2 tabs a day every 8 hours as needed for pain  Once you finish prednisone okay to take meloxicam once daily as needed for pain  Use a heating pad  Call if you are not gradually better  Call if you think you need to do physical therapy  Call anytime if you have severe symptoms

## 2022-07-11 NOTE — Assessment & Plan Note (Signed)
Back sprain: back sprain as described above, motor exam negative, no red flag symptoms. We agreed on conservative treatment with a round of prednisone, Flexeril, Tylenol, heating pad. Once you finish prednisone okay to use meloxicam, he has a leftover from a different issue. Call if not gradually better, call if motor deficits, increased pain or paresthesias. He could do some physical therapy so desired, he will let me know.

## 2022-08-25 DIAGNOSIS — M65341 Trigger finger, right ring finger: Secondary | ICD-10-CM | POA: Diagnosis not present

## 2022-08-27 DIAGNOSIS — G4733 Obstructive sleep apnea (adult) (pediatric): Secondary | ICD-10-CM | POA: Diagnosis not present

## 2022-09-17 DIAGNOSIS — A630 Anogenital (venereal) warts: Secondary | ICD-10-CM | POA: Diagnosis not present

## 2022-09-24 ENCOUNTER — Encounter: Payer: Self-pay | Admitting: Pulmonary Disease

## 2022-09-24 ENCOUNTER — Ambulatory Visit: Payer: BC Managed Care – PPO | Admitting: Pulmonary Disease

## 2022-09-24 VITALS — BP 120/78 | HR 75 | Temp 98.0°F | Ht 68.0 in | Wt 213.0 lb

## 2022-09-24 DIAGNOSIS — Z9989 Dependence on other enabling machines and devices: Secondary | ICD-10-CM | POA: Diagnosis not present

## 2022-09-24 DIAGNOSIS — M65341 Trigger finger, right ring finger: Secondary | ICD-10-CM | POA: Diagnosis not present

## 2022-09-24 DIAGNOSIS — G4733 Obstructive sleep apnea (adult) (pediatric): Secondary | ICD-10-CM | POA: Diagnosis not present

## 2022-09-24 NOTE — Progress Notes (Signed)
Scott Phillips    [id number]    Mar 20, 1971  Primary Care Physician:Paz, Alda Berthold, MD  Referring Physician: Colon Branch, MD [address] [place],  [place] [address]  Chief complaint:   Patient with a history of obstructive sleep apnea  HPI: Continues to use CPAP on a regular basis  No significant concerns  Patient appears to be working well  Just recently bought himself a new travel machine as well which he will be using frequently with travel  Diagnosed with obstructive sleep apnea over 10 years ago Has been using CPAP on a regular basis  Bedtime is usually between 11 and 1230 Falls asleep quickly About 1 awakening Final wake up time about 715  Weight fluctuates  Not having any significant daytime symptoms  He stated his old study just met the threshold for treatment  Outpatient Encounter Medications as of 09/24/2022  Medication Sig   b complex vitamins tablet Take 1 tablet by mouth 3 (three) times a week.   Calcium-Magnesium-Zinc (CALCIUM-MAGNESUIUM-ZINC PO) Take by mouth 2 (two) times daily.   Carbinoxamine Maleate 4 MG TABS Take 1 tablet (4 mg total) by mouth every 6 (six) hours as needed.   famotidine (PEPCID) 20 MG tablet Take 1 tablet (20 mg total) by mouth 2 (two) times daily before a meal.   METAMUCIL FIBER PO Take by mouth.   Misc Natural Products (TURMERIC CURCUMIN) CAPS Take 1,000 each by mouth.   Multiple Vitamin (MULTIVITAMIN ADULT PO) Take by mouth.   pantoprazole (PROTONIX) 40 MG tablet Take 1 tablet (40 mg total) by mouth 2 (two) times daily before a meal.   Probiotic Product (PROBIOTIC-10 PO) Take by mouth.   pseudoephedrine (SUDAFED) 120 MG 12 hr tablet    XHANCE 93 MCG/ACT EXHU Place 2 puffs into the nose 2 (two) times daily as needed.   [DISCONTINUED] cyclobenzaprine (FLEXERIL) 10 MG tablet Take 1 tablet (10 mg total) by mouth 2 (two) times daily as needed for muscle spasms.   [DISCONTINUED] imiquimod (ALDARA) 5 % cream  SMARTSIG:1 Topical Daily   [DISCONTINUED] predniSONE (DELTASONE) 10 MG tablet 4 tablets x 2 days, 3 tabs x 2 days, 2 tabs x 2 days, 1 tab x 2 days   No facility-administered encounter medications on file as of 09/24/2022.    Allergies as of 09/24/2022 - Review Complete 09/24/2022  Allergen Reaction Noted   Milk-related compounds Other (See Comments) 06/29/2020    Past Medical History:  Diagnosis Date   Allergic rhinosinusitis    Allergy    Arthritis    Colon polyps    Depression    Diverticulitis    GERD (gastroesophageal reflux disease)    Sinusitis    Sleep apnea     Past Surgical History:  Procedure Laterality Date   CARPAL TUNNEL RELEASE     KNEE ARTHROSCOPY Right 1991   cyst removal   MAXILLARY ANTROSTOMY Right 06/29/2020   Procedure: RIGHT MAXILLARY ANTROSTOMY WITH TISSUE REMOVAL;  Surgeon: Leta Baptist, MD;  Location: [facility];  Service: ENT;  Laterality: Right;   NASAL SINUS SURGERY     x3 in [place]   SUBACROMIAL DECOMPRESSION Left    TRIGGER FINGER RELEASE Right 2018   R 3rd   VASECTOMY  2019    Family History  Problem Relation Age of Onset   Cancer Mother    Depression Mother    Hypertension Mother    Miscarriages /  Stillbirths Mother    Cancer Father    Alcohol abuse Father    Arthritis Father    Diabetes Father    Hearing loss Father    Allergic rhinitis Sister    Depression Maternal Grandmother    Hearing loss Maternal Grandfather    Asthma Maternal Grandfather    Diabetes Paternal Grandfather    Colon cancer Neg Hx    Prostate cancer Neg Hx     Social History   Socioeconomic History   Marital status: Married    Spouse name: Not on file   Number of children: 1   Years of education: Not on file   Highest education level: Not on file  Occupational History   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  Tobacco Use   Smoking status: Former    Packs/day: 2.00    Types: Cigarettes    Start date: 12/30/1991    Quit date:  12/29/2014    Years since quitting: 7.7   Smokeless tobacco: Never  Vaping Use   Vaping Use: Never used  Substance and Sexual Activity   Alcohol use: Yes    Comment: occas   Drug use: Never   Sexual activity: Not on file  Other Topics Concern   Not on file  Social History Narrative   Daughter born  2018   Social Determinants of Health   Financial Resource Strain: Not on file  Food Insecurity: Not on file  Transportation Needs: Not on file  Physical Activity: Not on file  Stress: Not on file  Social Connections: Not on file  Intimate Partner Violence: Not on file    Review of Systems  Respiratory:  Positive for apnea.   Psychiatric/Behavioral:  Positive for sleep disturbance.   All other systems reviewed and are negative.   Vitals:   09/24/22 1352  BP: 120/78  Pulse: 75  Temp: 98 F (36.7 C)  SpO2: 98%     Physical Exam Constitutional:      Appearance: He is obese.  HENT:     Mouth/Throat:     Mouth: Mucous membranes are moist.     Comments: Crowded oropharynx Eyes:     General:        Right eye: No discharge.        Left eye: No discharge.  Neck:     Comments: 17-1/2 neck size  Cardiovascular:     Rate and Rhythm: Normal rate and regular rhythm.     Heart sounds:     No friction rub.  Pulmonary:     Effort: Pulmonary effort is normal. No respiratory distress.     Breath sounds: No stridor. No wheezing or rhonchi.  Musculoskeletal:     Cervical back: No rigidity or tenderness.  Neurological:     Mental Status: He is alert.  Psychiatric:        Mood and Affect: Mood normal.       11/27/2020   11:00 AM  Results of the Epworth flowsheet  Sitting and reading 1  Watching TV 1  Sitting, inactive in a public place (e.g. a theatre or a meeting) 0  As a passenger in a car for an hour without a break 1  Lying down to rest in the afternoon when circumstances permit 3  Sitting and talking to someone 0  Sitting quietly after a lunch without alcohol 0   In a car, while stopped for a few minutes in traffic 0  Total score 6  Data Reviewed: [place]  Compliance data reviewed showing 100% compliance with CPAP On auto CPAP 6-15 Average use of 6 hours 5 minutes Residual AHI of 0.1   Assessment:  Obstructive sleep apnea -100% compliance  Obesity -Encouraged about weight loss efforts -Diet and exercise as tolerated   Plan/Recommendations:  Continue CPAP nightly  Continue auto CPAP 6-15  Continue yearly follow-up  Adewale Olalere MD [facility] 09/24/2022, 2:01 PM  CC: Paz, Jose E, MD    

## 2022-09-24 NOTE — Patient Instructions (Signed)
I will see you a year from now  Continue using CPAP on a nightly basis Your compliance data shows that CPAP is working well  We will try and figure out how to get a download from your travel machine-not sure at present

## 2022-09-27 DIAGNOSIS — G4733 Obstructive sleep apnea (adult) (pediatric): Secondary | ICD-10-CM | POA: Diagnosis not present

## 2022-09-30 ENCOUNTER — Encounter: Payer: Self-pay | Admitting: Internal Medicine

## 2022-09-30 DIAGNOSIS — E785 Hyperlipidemia, unspecified: Secondary | ICD-10-CM

## 2022-09-30 DIAGNOSIS — Z0000 Encounter for general adult medical examination without abnormal findings: Secondary | ICD-10-CM

## 2022-09-30 NOTE — Addendum Note (Signed)
Addended byDamita Dunnings D on: 09/30/2022 04:15 PM   Modules accepted: Orders

## 2022-09-30 NOTE — Telephone Encounter (Signed)
Labs --CMP, FLP, CBC, TSH Please notify the patient.

## 2022-10-01 DIAGNOSIS — J0101 Acute recurrent maxillary sinusitis: Secondary | ICD-10-CM | POA: Diagnosis not present

## 2022-10-01 DIAGNOSIS — J343 Hypertrophy of nasal turbinates: Secondary | ICD-10-CM | POA: Diagnosis not present

## 2022-10-01 DIAGNOSIS — J310 Chronic rhinitis: Secondary | ICD-10-CM | POA: Diagnosis not present

## 2022-10-13 ENCOUNTER — Other Ambulatory Visit (INDEPENDENT_AMBULATORY_CARE_PROVIDER_SITE_OTHER): Payer: BC Managed Care – PPO

## 2022-10-13 DIAGNOSIS — E785 Hyperlipidemia, unspecified: Secondary | ICD-10-CM

## 2022-10-13 LAB — CBC WITH DIFFERENTIAL/PLATELET
Basophils Absolute: 0 10*3/uL (ref 0.0–0.1)
Basophils Relative: 0.6 % (ref 0.0–3.0)
Eosinophils Absolute: 0 10*3/uL (ref 0.0–0.7)
Eosinophils Relative: 0.8 % (ref 0.0–5.0)
HCT: 38.2 % — ABNORMAL LOW (ref 39.0–52.0)
Hemoglobin: 13.1 g/dL (ref 13.0–17.0)
Lymphocytes Relative: 29.5 % (ref 12.0–46.0)
Lymphs Abs: 1.5 10*3/uL (ref 0.7–4.0)
MCHC: 34.2 g/dL (ref 30.0–36.0)
MCV: 91.3 fl (ref 78.0–100.0)
Monocytes Absolute: 0.9 10*3/uL (ref 0.1–1.0)
Monocytes Relative: 16.7 % — ABNORMAL HIGH (ref 3.0–12.0)
Neutro Abs: 2.7 10*3/uL (ref 1.4–7.7)
Neutrophils Relative %: 52.4 % (ref 43.0–77.0)
Platelets: 191 10*3/uL (ref 150.0–400.0)
RBC: 4.19 Mil/uL — ABNORMAL LOW (ref 4.22–5.81)
RDW: 13.5 % (ref 11.5–15.5)
WBC: 5.2 10*3/uL (ref 4.0–10.5)

## 2022-10-13 LAB — LIPID PANEL
Cholesterol: 159 mg/dL (ref 0–200)
HDL: 49.6 mg/dL (ref >39.00)
LDL Cholesterol: 89 mg/dL (ref 0–99)
NonHDL: 109.48
Total CHOL/HDL Ratio: 3
Triglycerides: 102 mg/dL (ref 0.0–149.0)
VLDL: 20.4 mg/dL (ref 0.0–40.0)

## 2022-10-13 LAB — COMPREHENSIVE METABOLIC PANEL
ALT: 20 U/L (ref 0–53)
AST: 22 U/L (ref 0–37)
Albumin: 4.5 g/dL (ref 3.5–5.2)
Alkaline Phosphatase: 50 U/L (ref 39–117)
BUN: 13 mg/dL (ref 6–23)
CO2: 26 mEq/L (ref 19–32)
Calcium: 9.6 mg/dL (ref 8.4–10.5)
Chloride: 102 mEq/L (ref 96–112)
Creatinine, Ser: 1.06 mg/dL (ref 0.40–1.50)
GFR: 82.74 mL/min (ref >60.00)
Glucose, Bld: 95 mg/dL (ref 70–99)
Potassium: 4.2 mEq/L (ref 3.5–5.1)
Sodium: 138 mEq/L (ref 135–145)
Total Bilirubin: 0.6 mg/dL (ref 0.2–1.2)
Total Protein: 7.1 g/dL (ref 6.0–8.3)

## 2022-10-13 LAB — TSH: TSH: 2.18 u[IU]/mL (ref 0.35–5.50)

## 2022-10-21 ENCOUNTER — Encounter: Payer: Self-pay | Admitting: Internal Medicine

## 2022-10-21 ENCOUNTER — Ambulatory Visit (INDEPENDENT_AMBULATORY_CARE_PROVIDER_SITE_OTHER): Payer: BC Managed Care – PPO | Admitting: Internal Medicine

## 2022-10-21 VITALS — BP 126/78 | HR 70 | Temp 98.2°F | Resp 16 | Ht 68.0 in | Wt 213.0 lb

## 2022-10-21 DIAGNOSIS — Z0000 Encounter for general adult medical examination without abnormal findings: Secondary | ICD-10-CM | POA: Diagnosis not present

## 2022-10-21 DIAGNOSIS — Z1211 Encounter for screening for malignant neoplasm of colon: Secondary | ICD-10-CM

## 2022-10-21 DIAGNOSIS — Z23 Encounter for immunization: Secondary | ICD-10-CM | POA: Diagnosis not present

## 2022-10-21 NOTE — Progress Notes (Unsigned)
Subjective:    Patient ID: Scott Phillips, male    DOB: [birth year], 51 y.o.   MRN: [id number]  DOS:  10/21/2022 Type of visit - description: CPX  Had an acute visit, had low back pain, that is largely resolved. In general doing well: Follows a healthy diet and try to stay active. For the last month he did notice episodic numbness at the medial side of the big toes, can be right or left, last few hours, not related to any position or physical activity. Again low back pain is resolved.   Review of Systems See above   Past Medical History:  Diagnosis Date   Allergic rhinosinusitis    Allergy    Arthritis    Colon polyps    Depression    Diverticulitis    GERD (gastroesophageal reflux disease)    Sinusitis    Sleep apnea     Past Surgical History:  Procedure Laterality Date   CARPAL TUNNEL RELEASE     KNEE ARTHROSCOPY Right 1991   cyst removal   MAXILLARY ANTROSTOMY Right 06/29/2020   Procedure: RIGHT MAXILLARY ANTROSTOMY WITH TISSUE REMOVAL;  Surgeon: Leta Baptist, MD;  Location: [facility];  Service: ENT;  Laterality: Right;   NASAL SINUS SURGERY     x3 in [place]   SUBACROMIAL DECOMPRESSION Left    TRIGGER FINGER RELEASE Right 2018   R 3rd   VASECTOMY  2019    Current Outpatient Medications  Medication Instructions   b complex vitamins tablet 1 tablet, Oral, 3 times weekly   Calcium-Magnesium-Zinc (CALCIUM-MAGNESUIUM-ZINC PO) Oral, 2 times daily   Carbinoxamine Maleate 4 mg, Oral, Every 6 hours PRN   famotidine (PEPCID) 20 mg, Oral, 2 times daily before meals   METAMUCIL FIBER PO Oral   Misc Natural Products (TURMERIC CURCUMIN) CAPS 1,000 each, Oral   Multiple Vitamin (MULTIVITAMIN ADULT PO) Oral   pantoprazole (PROTONIX) 40 mg, Oral, 2 times daily before meals   Probiotic Product (PROBIOTIC-10 PO) Oral   pseudoephedrine (SUDAFED) 120 MG 12 hr tablet No dose, route, or frequency recorded.   XHANCE 93 MCG/ACT EXHU 2 puffs, Nasal, 2 times daily  PRN       Objective:   Physical Exam BP 126/78   Pulse 70   Temp 98.2 F (36.8 C) (Oral)   Resp 16   Ht '5\' 8"'$  (1.727 m)   Wt 213 lb (96.6 kg)   SpO2 96%   BMI 32.39 kg/m  General: Well developed, NAD, BMI noted Neck: No  thyromegaly  HEENT:  Normocephalic . Face symmetric, atraumatic Lungs:  CTA B Normal respiratory effort, no intercostal retractions, no accessory muscle use. Heart: RRR,  no murmur.  Abdomen:  Not distended, soft, non-tender. No rebound or rigidity.   Lower extremities: no pretibial edema bilaterally  Skin: Exposed areas without rash. Not pale. Not jaundice Neurologic:  alert & oriented X3.  Speech normal, gait appropriate for age and unassisted Strength symmetric and appropriate for age. Straight leg test negative.  DTR symmetric.  Pinprick examination toes normal Psych: Cognition and judgment appear intact.  Cooperative with normal attention span and concentration.  Behavior appropriate. No anxious or depressed appearing.     Assessment     ASSESSMENT New patient 06-2020, moved from [place]. OSA , dx ~2010, +CPAP GERD: [place] in [place] remotely (2006?) Colon Polyps H/o Diverticulitis  H/o Depression Allergies  Sinus issues: Surgery 2012, surgery 06/2020   PLAN Here for CPX  GERD: Well controlled with 2 medications, unable to drop one of them. OSA, saw pulmonary last month Paresthesias, toes: Patient reports this is a very minor problem, never exam normal, we agree he will let me know if symptoms increase or are bothersome, might need further eval. RTC 1 year   -Td 2018 -Covid vax   booster d/w pt - flu shot today -CCS:  C-scope done 09/28/2017, according to report procedure done because LLQ abdominal pain and abnormal GI x-ray.  Findings: Cecum polyp, diverticulosis, BX: Sessile serrated polyp.  Next in 5 years (informally d/w Dr. Bryan Lemma 2021).  Refer to Dr. Bryan Lemma for colonoscopy -Prostate cancer screening:  start  screening at age 89. - diet and exercise: Doing well -Recent labs reviewed, all very good.   === Back sprain: back sprain as described above, motor exam negative, no red flag symptoms. We agreed on conservative treatment with a round of prednisone, Flexeril, Tylenol, heating pad. Once you finish prednisone okay to use meloxicam, he has a leftover from a different issue. Call if not gradually better, call if motor deficits, increased pain or paresthesias. He could do some physical therapy so desired, he will let me know.

## 2022-10-21 NOTE — Patient Instructions (Signed)
We are referring you for a colonoscopy. Please reach out to the gastroenterology office at [facility] [place] for   physical exam in 1 year

## 2022-10-22 ENCOUNTER — Encounter: Payer: Self-pay | Admitting: Internal Medicine

## 2022-10-22 NOTE — Assessment & Plan Note (Signed)
Here for CPX GERD: Well controlled as long as he takets a PPI and Pepcid OSA, saw pulmonary last month Paresthesias, toes: Patient reports this is a very minor problem, neuro exam normal, we agree he will let me know if symptoms increase or are bothersome, might need further eval. RTC 1 year

## 2022-10-22 NOTE — Assessment & Plan Note (Signed)
-  Td 2018 -Covid vax   booster d/w pt - flu shot today -CCS:  C-scope done 09/28/2017, according to report procedure , cscope done d/t  LLQ abdominal pain and abnormal GI x-ray.  Findings: Cecum polyp, diverticulosis, BX: Sessile serrated polyp.  Next in 5 years (informally d/w Dr. Bryan Lemma 2021).  Refer to Dr. Bryan Lemma for colonoscopy -Prostate cancer screening:  start screening at age 51. - diet and exercise: Doing well -Recent labs reviewed, all very good.

## 2022-10-27 ENCOUNTER — Other Ambulatory Visit (HOSPITAL_BASED_OUTPATIENT_CLINIC_OR_DEPARTMENT_OTHER): Payer: Self-pay

## 2022-10-27 DIAGNOSIS — G4733 Obstructive sleep apnea (adult) (pediatric): Secondary | ICD-10-CM | POA: Diagnosis not present

## 2022-10-27 MED ORDER — COMIRNATY 30 MCG/0.3ML IM SUSY
PREFILLED_SYRINGE | INTRAMUSCULAR | 0 refills | Status: DC
  Filled 2022-10-27: qty 0.3, 1d supply, fill #0

## 2022-10-28 DIAGNOSIS — A630 Anogenital (venereal) warts: Secondary | ICD-10-CM | POA: Diagnosis not present

## 2022-10-28 DIAGNOSIS — D225 Melanocytic nevi of trunk: Secondary | ICD-10-CM | POA: Diagnosis not present

## 2022-10-28 DIAGNOSIS — L814 Other melanin hyperpigmentation: Secondary | ICD-10-CM | POA: Diagnosis not present

## 2022-10-28 DIAGNOSIS — X32XXXS Exposure to sunlight, sequela: Secondary | ICD-10-CM | POA: Diagnosis not present

## 2022-10-29 ENCOUNTER — Telehealth: Payer: Self-pay | Admitting: Gastroenterology

## 2022-10-29 NOTE — Telephone Encounter (Signed)
Hi Dr. Bryan Lemma,  We received a referral for patient to have another colonoscopy, had one done in 2018. Reports are available for yo to review in Epic.  Please advise on scheduling.  Thank you

## 2022-10-30 NOTE — Telephone Encounter (Signed)
Records reviewed and notable for the following:  - 09/28/2017: Colonoscopy at [facility]: 4 mm cecal polyp (path: SSP), sigmoid diverticulosis.  Recommended 5-year repeat  Based on the previous colonoscopy, okay to schedule for direct access colonoscopy with me in the [facility] for ongoing polyp surveillance.

## 2022-11-06 ENCOUNTER — Encounter: Payer: Self-pay | Admitting: Gastroenterology

## 2022-11-06 NOTE — Telephone Encounter (Signed)
Called patient to schedule left voicemail. 

## 2022-11-27 DIAGNOSIS — G4733 Obstructive sleep apnea (adult) (pediatric): Secondary | ICD-10-CM | POA: Diagnosis not present

## 2022-12-17 ENCOUNTER — Ambulatory Visit (AMBULATORY_SURGERY_CENTER): Payer: BC Managed Care – PPO

## 2022-12-17 VITALS — Ht 68.0 in | Wt 225.0 lb

## 2022-12-17 DIAGNOSIS — Z86010 Personal history of colonic polyps: Secondary | ICD-10-CM

## 2022-12-17 MED ORDER — NA SULFATE-K SULFATE-MG SULF 17.5-3.13-1.6 GM/177ML PO SOLN: 1.0000 | Freq: Once | ORAL | 0 refills | Status: AC

## 2022-12-17 NOTE — Progress Notes (Signed)
No egg or soy allergy known to patient  No issues known to pt with past sedation with any surgeries or procedures Patient denies ever being told they had issues or difficulty with intubation  No FH of Malignant Hyperthermia Pt is not on diet pills Pt is not on  home 02  Pt is not on blood thinners  Pt denies issues with constipation  No A fib or A flutter  Pt instructed to use Singlecare.[email or web address] or GoodRx for a price reduction on prep

## 2022-12-23 DIAGNOSIS — J039 Acute tonsillitis, unspecified: Secondary | ICD-10-CM | POA: Diagnosis not present

## 2022-12-24 ENCOUNTER — Other Ambulatory Visit: Payer: Self-pay

## 2022-12-24 MED ORDER — CARBINOXAMINE MALEATE 4 MG PO TABS: 4.0000 mg | ORAL_TABLET | Freq: Four times a day (QID) | ORAL | 5 refills | Status: DC | PRN

## 2022-12-25 ENCOUNTER — Other Ambulatory Visit: Payer: Self-pay

## 2022-12-25 ENCOUNTER — Telehealth: Payer: Self-pay | Admitting: Gastroenterology

## 2022-12-25 DIAGNOSIS — Z86010 Personal history of colonic polyps: Secondary | ICD-10-CM

## 2022-12-25 MED ORDER — NA SULFATE-K SULFATE-MG SULF 17.5-3.13-1.6 GM/177ML PO SOLN: 1.0000 | Freq: Once | ORAL | 0 refills | Status: AC

## 2022-12-25 NOTE — Telephone Encounter (Signed)
Patient called, would like to get his Suprep medication switched to [facility] in [place] on [address]. Thank you.

## 2022-12-27 DIAGNOSIS — G4733 Obstructive sleep apnea (adult) (pediatric): Secondary | ICD-10-CM | POA: Diagnosis not present

## 2022-12-30 ENCOUNTER — Ambulatory Visit: Payer: BC Managed Care – PPO | Admitting: Internal Medicine

## 2022-12-30 ENCOUNTER — Encounter: Payer: Self-pay | Admitting: Internal Medicine

## 2022-12-30 VITALS — BP 138/72 | HR 71 | Temp 98.1°F | Resp 18 | Ht 68.0 in | Wt 229.1 lb

## 2022-12-30 DIAGNOSIS — L089 Local infection of the skin and subcutaneous tissue, unspecified: Secondary | ICD-10-CM | POA: Diagnosis not present

## 2022-12-30 DIAGNOSIS — L729 Follicular cyst of the skin and subcutaneous tissue, unspecified: Secondary | ICD-10-CM | POA: Diagnosis not present

## 2022-12-30 MED ORDER — DOXYCYCLINE HYCLATE 100 MG PO TABS: 100.0000 mg | ORAL_TABLET | Freq: Two times a day (BID) | ORAL | 0 refills | Status: DC

## 2022-12-30 MED ORDER — DOXYCYCLINE MONOHYDRATE 100 MG PO CAPS: 100.0000 mg | ORAL_CAPSULE | Freq: Two times a day (BID) | ORAL | 0 refills | Status: DC

## 2022-12-30 NOTE — Patient Instructions (Addendum)
Stop  amoxicillin  Start doxycycline for 1 week  Keep the area clean and dry, okay to take showers  If needed, squeeze more discharge from the area  Call if not gradually better

## 2022-12-30 NOTE — Progress Notes (Unsigned)
Subjective:    Patient ID: Scott Phillips, male    DOB: [birth year], 52 y.o.   MRN: [id number]  DOS:  12/30/2022 Type of visit - description: Acute  About a week ago, went to urgent care, diagnosed with tonsillitis and prescribed amoxicillin. Interestingly, at the same time well-known cyst at the left chest: Red, warm, swollen and very painful. 4 days ago the area started draining. No fever or chills.    Review of Systems See above   Past Medical History:  Diagnosis Date   Allergic rhinosinusitis    Allergy    Arthritis    Colon polyps    Depression    Diverticulitis    GERD (gastroesophageal reflux disease)    Sinusitis    Sleep apnea     Past Surgical History:  Procedure Laterality Date   CARPAL TUNNEL RELEASE     KNEE ARTHROSCOPY Right 1991   cyst removal   MAXILLARY ANTROSTOMY Right 06/29/2020   Procedure: RIGHT MAXILLARY ANTROSTOMY WITH TISSUE REMOVAL;  Surgeon: Leta Baptist, MD;  Location: [facility];  Service: ENT;  Laterality: Right;   NASAL SINUS SURGERY     x3 in [place]   SUBACROMIAL DECOMPRESSION Left    TRIGGER FINGER RELEASE Right 2018   R 3rd   VASECTOMY  2019    Current Outpatient Medications  Medication Instructions   amoxicillin (AMOXIL) 500 MG capsule    b complex vitamins tablet 1 tablet, Oral, 3 times weekly   Calcium-Magnesium-Zinc (CALCIUM-MAGNESUIUM-ZINC PO) Oral, 2 times daily   Carbinoxamine Maleate 4 mg, Oral, Every 6 hours PRN   famotidine (PEPCID) 20 mg, Oral, 2 times daily before meals   melatonin 5 mg, Oral, Daily at bedtime   METAMUCIL FIBER PO Oral   Misc Natural Products (TURMERIC CURCUMIN) CAPS 1,000 each, Oral   Multiple Vitamin (MULTIVITAMIN ADULT PO) Oral   pantoprazole (PROTONIX) 40 mg, Oral, 2 times daily before meals   Probiotic Product (PROBIOTIC-10 PO) Oral   XHANCE 93 MCG/ACT EXHU 2 puffs, Nasal, 2 times daily PRN       Objective:   Physical Exam Chest:      BP 138/72   Pulse 71   Temp  98.1 F (36.7 C) (Oral)   Resp 18   Ht '5\' 8"'$  (1.727 m)   Wt 229 lb 2 oz (103.9 kg)   SpO2 98%   BMI 34.84 kg/m  General:   Well developed, NAD, BMI noted. HEENT:  Normocephalic . Face symmetric, atraumatic   Lower extremities: no pretibial edema bilaterally  Skin: Not pale. Not jaundice Neurologic:  alert & oriented X3.  Speech normal, gait appropriate for age and unassisted Psych--  Cognition and judgment appear intact.  Cooperative with normal attention span and concentration.  Behavior appropriate. No anxious or depressed appearing.      Assessment     ASSESSMENT New patient 06-2020, moved from [place]. OSA , dx ~2010, +CPAP GERD: had E[place] in [place] remotely (2006?) Colon Polyps H/o Diverticulitis  H/o Depression Allergies  Sinus issues: Surgery 2012, surgery 06/2020   PLAN Infected sebaceous cyst: The patient reports he has a cyst there for a while, evidently has become infected self drained 4 days ago. I was able to remove some fatty, purulent material through the opening. The patient declined local anesthesia. He is currently taking amoxicillin but I will switch to doxycycline since there is some evidence of cellulitis. To let me know if is not gradually better.  See AVS

## 2022-12-31 NOTE — Assessment & Plan Note (Signed)
Infected sebaceous cyst: The patient reports he has a cyst there for a while, evidently it become infected,  self drained 4 days ago. I was able to remove some fatty, purulent material through the opening.  Culture sent. The patient declined local anesthesia. He is currently taking amoxicillin but I will switch to doxycycline since there is some evidence of cellulitis. To let me know if is not gradually better.  See AVS.

## 2023-01-02 ENCOUNTER — Encounter: Payer: Self-pay | Admitting: Internal Medicine

## 2023-01-03 LAB — WOUND CULTURE
MICRO NUMBER:: 14377188
RESULT:: NO GROWTH
SPECIMEN QUALITY:: ADEQUATE

## 2023-01-05 ENCOUNTER — Ambulatory Visit: Payer: BC Managed Care – PPO | Admitting: Internal Medicine

## 2023-01-08 ENCOUNTER — Telehealth (INDEPENDENT_AMBULATORY_CARE_PROVIDER_SITE_OTHER): Payer: BC Managed Care – PPO | Admitting: Medical

## 2023-01-08 DIAGNOSIS — U071 COVID-19: Secondary | ICD-10-CM | POA: Diagnosis not present

## 2023-01-08 MED ORDER — NIRMATRELVIR/RITONAVIR (PAXLOVID)TABLET: 3.0000 | ORAL_TABLET | Freq: Two times a day (BID) | ORAL | 0 refills | Status: AC

## 2023-01-08 MED ORDER — BENZONATATE 100 MG PO CAPS: 100.0000 mg | ORAL_CAPSULE | Freq: Three times a day (TID) | ORAL | 0 refills | Status: DC | PRN

## 2023-01-08 NOTE — Progress Notes (Signed)
   Subjective:    Patient ID: Scott Phillips, male    DOB: [birth year], 52 y.o.   MRN: [id number]  HPI Virtual Visit via Video Note  I connected with Scott Phillips on 01/08/23 at 11:00 AM EST by a video enabled telemedicine application and verified that I am speaking with the correct person using two identifiers.  Location: Patient: home  Provider: office   I discussed the limitations of evaluation and management by telemedicine and the availability of in person appointments. The patient expressed understanding and agreed to proceed.  History of Present Illness:  Pt states tested + for covid. He states uri type symptoms nasal congestion ,st and low grade fever. He has faint muscle aches no wheezing or shortness of breath.   Pt states history of covid November 2022. Pt also states he did get covd vaccine this October.  He has upcoming colonoscopy Wednesday or next week. He will call gi office.     Observations/Objective: General-no acute distress, pleasant, oriented. Lungs- on inspection lungs appear unlabored. Neck- no tracheal deviation or jvd on inspection. Neuro- gross motor function appears intact.   Assessment and Plan: Patient Instructions  COVID infection  with mild to moderate symptoms(early in presentation).  Formally vaccinated and had covid infection in past as well.  Recommend rest, hydration, xhance nasal spray for nasal congestion, benzonatate for cough and vitamin D over-the-counter 2000- 4000 international units daily. Discussed benefits vs risk of paxlovid. Making available for you to start within 5 days of symptom onset.   If secondary sinus infection of bronchitis like symptoms let us know.  Discussed 5 days stay at home guidelines and how to end quarantin.  If signs/symptoms change or worsen let us know.  Follow up 7-10 days or sooner if needed.   Mackie Pai, PA-C   Follow Up Instructions:    I discussed the assessment and treatment plan  with the patient. The patient was provided an opportunity to ask questions and all were answered. The patient agreed with the plan and demonstrated an understanding of the instructions.   The patient was advised to call back or seek an in-person evaluation if the symptoms worsen or if the condition fails to improve as anticipated.     Mackie Pai, PA-C    Review of Systems  Constitutional:  Positive for fever. Negative for fatigue.  HENT:  Positive for congestion. Negative for sinus pressure and sinus pain.   Respiratory:  Positive for cough. Negative for chest tightness and wheezing.   Cardiovascular:  Negative for chest pain and palpitations.  Gastrointestinal:  Negative for abdominal pain.  Musculoskeletal:        Mild muscle aches.       Objective:   Physical Exam        Assessment & Plan:

## 2023-01-08 NOTE — Patient Instructions (Signed)
COVID infection  with mild to moderate symptoms(early in presentation).  Formally vaccinated and had covid infection in past as well.  Recommend rest, hydration, xhance nasal spray for nasal congestion, benzonatate for cough and vitamin D over-the-counter 2000- 4000 international units daily. Discussed benefits vs risk of paxlovid. Making available for you to start within 5 days of symptom onset.   If secondary sinus infection of bronchitis like symptoms let us know.  Discussed 5 days stay at home guidelines and how to end quarantin.  If signs/symptoms change or worsen let us know.  Follow up 7-10 days or sooner if needed.

## 2023-01-14 ENCOUNTER — Encounter: Payer: BC Managed Care – PPO | Admitting: Gastroenterology

## 2023-01-18 ENCOUNTER — Encounter: Payer: Self-pay | Admitting: Certified Registered Nurse Anesthetist

## 2023-01-20 ENCOUNTER — Encounter: Payer: Self-pay | Admitting: Gastroenterology

## 2023-01-21 ENCOUNTER — Ambulatory Visit (AMBULATORY_SURGERY_CENTER): Payer: BC Managed Care – PPO | Admitting: Gastroenterology

## 2023-01-21 ENCOUNTER — Encounter: Payer: Self-pay | Admitting: Gastroenterology

## 2023-01-21 VITALS — BP 106/65 | HR 65 | Temp 98.2°F | Resp 17

## 2023-01-21 DIAGNOSIS — Z86010 Personal history of colonic polyps: Secondary | ICD-10-CM

## 2023-01-21 DIAGNOSIS — Z1211 Encounter for screening for malignant neoplasm of colon: Secondary | ICD-10-CM | POA: Diagnosis not present

## 2023-01-21 DIAGNOSIS — K640 First degree hemorrhoids: Secondary | ICD-10-CM

## 2023-01-21 DIAGNOSIS — K5730 Diverticulosis of large intestine without perforation or abscess without bleeding: Secondary | ICD-10-CM

## 2023-01-21 DIAGNOSIS — Z09 Encounter for follow-up examination after completed treatment for conditions other than malignant neoplasm: Secondary | ICD-10-CM

## 2023-01-21 MED ORDER — SODIUM CHLORIDE 0.9 % IV SOLN: 500.0000 mL | Freq: Once | INTRAVENOUS | Status: DC

## 2023-01-21 NOTE — Op Note (Signed)
[place] Patient Name: Scott Phillips Procedure Date: 01/21/2023 11:12 AM MRN: [id number] Endoscopist: Gerrit Heck , MD, [id number] Age: 52 Referring MD:  Date of Birth: 1971/09/10 Gender: Male Account #: [id number] Procedure:                Colonoscopy Indications:              High risk colon cancer surveillance: Personal                            history of sessile serrated colon polyp (less than                            10 mm in size) with no dysplasia                           Last colonoscopy was 09/28/2017 at [facility] and notable for 4                            mm cecal polyp (path: SSP), sigmoid diverticulosis.                            Recommended 5-year repeat                           No FHx of colon cancer. No recent GI symptoms Medicines:                Monitored Anesthesia Care Procedure:                Pre-Anesthesia Assessment:                           - Prior to the procedure, a History and Physical                            was performed, and patient medications and                            allergies were reviewed. The patient's tolerance of                            previous anesthesia was also reviewed. The risks                            and benefits of the procedure and the sedation                            options and risks were discussed with the patient.                            All questions were answered, and informed consent  was obtained. Prior Anticoagulants: The patient has                            taken no anticoagulant or antiplatelet agents. ASA                            Grade Assessment: II - A patient with mild systemic                            disease. After reviewing the risks and benefits,                            the patient was deemed in satisfactory condition to                            undergo the procedure.                            After obtaining informed consent, the colonoscope                            was passed under direct vision. Throughout the                            procedure, the patient's blood pressure, pulse, and                            oxygen saturations were monitored continuously. The                            CF [id number] #[id number] was introduced through the anus                            and advanced to the the terminal ileum. The                            colonoscopy was performed without difficulty. The                            patient tolerated the procedure well. The quality                            of the bowel preparation was excellent. The                            terminal ileum, ileocecal valve, appendiceal                            orifice, and rectum were photographed. Scope In: 11:19:15 AM Scope Out: 11:29:56 AM Scope Withdrawal Time: 0 hours 8 minutes 31 seconds  Total Procedure Duration: 0 hours 10 minutes 41 seconds  Findings:                 The perianal and digital rectal examinations were  normal.                           Multiple medium-mouthed and small-mouthed                            diverticula were found in the sigmoid colon.                           The exam was otherwise normal throughout the                            remainder of the colon.                           Non-bleeding internal hemorrhoids were found during                            retroflexion. The hemorrhoids were small.                           The terminal ileum appeared normal. Complications:            No immediate complications. Estimated Blood Loss:     Estimated blood loss: none. Impression:               - Diverticulosis in the sigmoid colon.                           - Non-bleeding internal hemorrhoids.                           - The examined portion of the ileum was normal.                           - No specimens  collected. Recommendation:           - Patient has a contact number available for                            emergencies. The signs and symptoms of potential                            delayed complications were discussed with the                            patient. Return to normal activities tomorrow.                            Written discharge instructions were provided to the                            patient.                           - Resume previous diet.                           -  Continue present medications.                           - Repeat colonoscopy in 7 years for surveillance                            due to prior history.                           - Return to GI clinic PRN. Gerrit Heck, MD 01/21/2023 11:36:03 AM

## 2023-01-21 NOTE — Progress Notes (Signed)
GASTROENTEROLOGY PROCEDURE H&P NOTE   Primary Care Physician: Colon Branch, MD    Reason for Procedure:  Colon Cancer screening, colon polyp surveillance  Plan:    Colonoscopy  Patient is appropriate for endoscopic procedure(s) in the ambulatory ([facility]) setting.  The nature of the procedure, as well as the risks, benefits, and alternatives were carefully and thoroughly reviewed with the patient. Ample time for discussion and questions allowed. The patient understood, was satisfied, and agreed to proceed.     HPI: Scott Phillips is a 52 y.o. male who presents for colonoscopy for routine Colon Cancer screening and continued polyp surveillance.  No active GI symptoms.      - 09/28/2017: Colonoscopy at [facility]: 4 mm cecal polyp (path: SSP), sigmoid diverticulosis. Recommended 5-year repeat   Past Medical History:  Diagnosis Date   Allergic rhinosinusitis    Allergy    Arthritis    Colon polyps    Depression    Diverticulitis    GERD (gastroesophageal reflux disease)    Sinusitis    Sleep apnea     Past Surgical History:  Procedure Laterality Date   CARPAL TUNNEL RELEASE     KNEE ARTHROSCOPY Right 1991   cyst removal   MAXILLARY ANTROSTOMY Right 06/29/2020   Procedure: RIGHT MAXILLARY ANTROSTOMY WITH TISSUE REMOVAL;  Surgeon: Leta Baptist, MD;  Location: [facility];  Service: ENT;  Laterality: Right;   NASAL SINUS SURGERY     x3 in [place]   SUBACROMIAL DECOMPRESSION Left    TRIGGER FINGER RELEASE Right 2018   R 3rd   VASECTOMY  2019    Prior to Admission medications   Medication Sig Start Date End Date Taking? Authorizing Provider  b complex vitamins tablet Take 1 tablet by mouth 3 (three) times a week.   Yes [provider]  Calcium-Magnesium-Zinc (CALCIUM-MAGNESUIUM-ZINC PO) Take by mouth 2 (two) times daily.   Yes [provider]  Carbinoxamine Maleate 4 MG TABS Take 1 tablet (4 mg total) by  mouth every 6 (six) hours as needed. 12/24/22  Yes Ambs, Kathrine Cords, FNP  famotidine (PEPCID) 20 MG tablet Take 1 tablet (20 mg total) by mouth 2 (two) times daily before a meal. 07/10/20  Yes Paz, Jacqulyn Bath E, MD  melatonin 5 MG TABS Take 5 mg by mouth at bedtime.   Yes [provider]  Misc Natural Products (TURMERIC CURCUMIN) CAPS Take 1,000 each by mouth.   Yes [provider]  Multiple Vitamin (MULTIVITAMIN ADULT PO) Take by mouth.   Yes [provider]  pantoprazole (PROTONIX) 40 MG tablet Take 1 tablet (40 mg total) by mouth 2 (two) times daily before a meal. 07/02/22  Yes Paz, Alda Berthold, MD  Probiotic Product (PROBIOTIC-10 PO) Take by mouth.   Yes [provider]  Truett Perna 93 MCG/ACT EXHU Place 2 puffs into the nose 2 (two) times daily as needed. 04/23/22  Yes Ambs, Kathrine Cords, FNP  benzonatate (TESSALON) 100 MG capsule Take 1 capsule (100 mg total) by mouth 3 (three) times daily as needed for cough. 01/08/23   Saguier, Percell Miller, PA-C  doxycycline (MONODOX) 100 MG capsule Take 1 capsule (100 mg total) by mouth 2 (two) times daily. 12/30/22   Colon Branch, MD  METAMUCIL FIBER PO Take by mouth.    [provider]    Current Outpatient Medications  Medication Sig Dispense Refill   b complex vitamins tablet Take 1 tablet by mouth 3 (  three) times a week.     Calcium-Magnesium-Zinc (CALCIUM-MAGNESUIUM-ZINC PO) Take by mouth 2 (two) times daily.     Carbinoxamine Maleate 4 MG TABS Take 1 tablet (4 mg total) by mouth every 6 (six) hours as needed. 60 tablet 5   famotidine (PEPCID) 20 MG tablet Take 1 tablet (20 mg total) by mouth 2 (two) times daily before a meal. 180 tablet 1   melatonin 5 MG TABS Take 5 mg by mouth at bedtime.     Misc Natural Products (TURMERIC CURCUMIN) CAPS Take 1,000 each by mouth.     Multiple Vitamin (MULTIVITAMIN ADULT PO) Take by mouth.     pantoprazole (PROTONIX) 40 MG tablet Take 1 tablet (40 mg total) by mouth 2 (two) times daily before a meal. 180  tablet 3   Probiotic Product (PROBIOTIC-10 PO) Take by mouth.     XHANCE 93 MCG/ACT EXHU Place 2 puffs into the nose 2 (two) times daily as needed. 32 mL 3   benzonatate (TESSALON) 100 MG capsule Take 1 capsule (100 mg total) by mouth 3 (three) times daily as needed for cough. 30 capsule 0   doxycycline (MONODOX) 100 MG capsule Take 1 capsule (100 mg total) by mouth 2 (two) times daily. 14 capsule 0   METAMUCIL FIBER PO Take by mouth.     Current Facility-Administered Medications  Medication Dose Route Frequency Provider Last Rate Last Admin   0.9 %  sodium chloride infusion  500 mL Intravenous Once Zella Dewan V, DO        Allergies as of 01/21/2023 - Review Complete 01/21/2023  Allergen Reaction Noted   Milk-related compounds Other (See Comments) 06/29/2020    Family History  Problem Relation Age of Onset   Cancer Mother    Depression Mother    Hypertension Mother    Miscarriages / [place] Mother    Cancer Father    Alcohol abuse Father    Arthritis Father    Diabetes Father    Hearing loss Father    Allergic rhinitis Sister    Depression Maternal Grandmother    Hearing loss Maternal Grandfather    Asthma Maternal Grandfather    Diabetes Paternal Grandfather    Colon cancer Neg Hx    Prostate cancer Neg Hx    CAD Neg Hx     Social History   Socioeconomic History   Marital status: Married    Spouse name: Not on file   Number of children: 1   Years of education: Not on file   Highest education level: Not on file  Occupational History   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  Tobacco Use   Smoking status: Former    Packs/day: 2.00    Types: Cigarettes    Start date: 12/30/1991    Quit date: 12/29/2014    Years since quitting: 8.0   Smokeless tobacco: Never  Vaping Use   Vaping Use: Never used  Substance and Sexual Activity   Alcohol use: Yes    Comment: occas   Drug use: Never   Sexual activity: Not on file  Other Topics Concern   Not on file   Social History Narrative   Daughter born  2018   Social Determinants of Health   Financial Resource Strain: Not on file  Food Insecurity: Not on file  Transportation Needs: Not on file  Physical Activity: Not on file  Stress: Not on file  Social Connections: Not on file  Intimate Partner Violence: Not  on file    Physical Exam: Vital signs in last 24 hours: '@Temp'$  98.2 F (36.8 C)  GEN: NAD EYE: Sclerae anicteric ENT: MMM CV: Non-tachycardic Pulm: CTA b/l GI: Soft, NT/ND NEURO:  Alert & Oriented x 3   Gerrit Heck, DO [facility]   01/21/2023 11:10 AM

## 2023-01-21 NOTE — Progress Notes (Signed)
Pt's states no medical or surgical changes since previsit or office visit. 

## 2023-01-21 NOTE — Progress Notes (Signed)
Report given to PACU, vss 

## 2023-01-21 NOTE — Patient Instructions (Signed)
Read all ofd the handouts given to you by your recovery room nurse.  You will need another colonoscopy in 7 years.  YOU HAD AN ENDOSCOPIC PROCEDURE TODAY AT [facility]:   Refer to the procedure report that was given to you for any specific questions about what was found during the examination.  If the procedure report does not answer your questions, please call your gastroenterologist to clarify.  If you requested that your care partner not be given the details of your procedure findings, then the procedure report has been included in a sealed envelope for you to review at your convenience later.  YOU SHOULD EXPECT: Some feelings of bloating in the abdomen. Passage of more gas than usual.  Walking can help get rid of the air that was put into your GI tract during the procedure and reduce the bloating. If you had a lower endoscopy (such as a colonoscopy or flexible sigmoidoscopy) you may notice spotting of blood in your stool or on the toilet paper. If you underwent a bowel prep for your procedure, you may not have a normal bowel movement for a few days.  Please Note:  You might notice some irritation and congestion in your nose or some drainage.  This is from the oxygen used during your procedure.  There is no need for concern and it should clear up in a day or so.  SYMPTOMS TO REPORT IMMEDIATELY:  Following lower endoscopy (colonoscopy or flexible sigmoidoscopy):  Excessive amounts of blood in the stool  Significant tenderness or worsening of abdominal pains  Swelling of the abdomen that is new, acute  Fever of 100F or higher     For urgent or emergent issues, a gastroenterologist can be reached at any hour by calling [phone]. Do not use MyChart messaging for urgent concerns.    DIET:  We do recommend a small meal at first, but then you may proceed to your regular diet.  Drink plenty of fluids but you should avoid alcoholic beverages for 24 hours. Try to increase the  fiber in your diet, and drink plenty of water.  ACTIVITY:  You should plan to take it easy for the rest of today and you should NOT DRIVE or use heavy machinery until tomorrow (because of the sedation medicines used during the test).    FOLLOW UP: Our staff will call the number listed on your records the next business day following your procedure.  We will call around 7:15- 8:00 am to check on you and address any questions or concerns that you may have regarding the information given to you following your procedure. If we do not reach you, we will leave a message.      SIGNATURES/CONFIDENTIALITY: You and/or your care partner have signed paperwork which will be entered into your electronic medical record.  These signatures attest to the fact that that the information above on your After Visit Summary has been reviewed and is understood.  Full responsibility of the confidentiality of this discharge information lies with you and/or your care-partner.

## 2023-01-22 ENCOUNTER — Telehealth: Payer: Self-pay | Admitting: *Deleted

## 2023-01-22 NOTE — Telephone Encounter (Signed)
  Follow up Call-     01/21/2023   10:46 AM  Call back number  Post procedure Call Back phone  # [phone]  Permission to leave phone message Yes     Patient questions:  Do you have a fever, pain , or abdominal swelling? No. Pain Score  0 *  Have you tolerated food without any problems? Yes.    Have you been able to return to your normal activities? Yes.    Do you have any questions about your discharge instructions: Diet   No. Medications  No. Follow up visit  No.  Do you have questions or concerns about your Care? No.  Actions: * If pain score is 4 or above: No action needed, pain <4.

## 2023-01-27 DIAGNOSIS — G4733 Obstructive sleep apnea (adult) (pediatric): Secondary | ICD-10-CM | POA: Diagnosis not present

## 2023-02-26 DIAGNOSIS — G4733 Obstructive sleep apnea (adult) (pediatric): Secondary | ICD-10-CM | POA: Diagnosis not present

## 2023-03-11 ENCOUNTER — Ambulatory Visit: Payer: BC Managed Care – PPO | Admitting: Family Medicine

## 2023-03-11 ENCOUNTER — Encounter: Payer: Self-pay | Admitting: Family Medicine

## 2023-03-11 VITALS — BP 110/72 | HR 88 | Temp 98.2°F | Ht 68.0 in | Wt 233.1 lb

## 2023-03-11 DIAGNOSIS — J0100 Acute maxillary sinusitis, unspecified: Secondary | ICD-10-CM | POA: Diagnosis not present

## 2023-03-11 LAB — POC COVID19 BINAXNOW: SARS Coronavirus 2 Ag: NEGATIVE

## 2023-03-11 LAB — POCT INFLUENZA A/B
Influenza A, POC: NEGATIVE
Influenza B, POC: NEGATIVE

## 2023-03-11 MED ORDER — AMOXICILLIN-POT CLAVULANATE 875-125 MG PO TABS: 1.0000 | ORAL_TABLET | Freq: Two times a day (BID) | ORAL | 0 refills | Status: AC

## 2023-03-11 NOTE — Patient Instructions (Signed)
Continue to push fluids, practice good hand hygiene, and cover your mouth if you cough.  If you start having fevers, shaking or shortness of breath, seek immediate care.  OK to take Tylenol 1000 mg (2 extra strength tabs) or 975 mg (3 regular strength tabs) every 6 hours as needed.  Ibuprofen 400-600 mg (2-3 over the counter strength tabs) every 6 hours as needed for pain.  Let us know if you need anything.

## 2023-03-11 NOTE — Addendum Note (Signed)
Addended by: Sharon Seller B on: 03/11/2023 03:35 PM   Modules accepted: Orders

## 2023-03-11 NOTE — Progress Notes (Signed)
Chief Complaint  Patient presents with   Fever    Chest and sinus congestion Cough Body aches Fatigue     Scott Phillips here for URI complaints.  Duration: 10 days  Associated symptoms: sinus congestion, sinus pain, rhinorrhea, chest tightness, and coughing Denies: itchy watery eyes, ear pain, ear drainage, sore throat, wheezing, shortness of breath, myalgia, and fevers Treatment to date: Sudafed, Tylenol, Carbinoxamine Sick contacts: Yes; daughter and wife  Past Medical History:  Diagnosis Date   Allergic rhinosinusitis    Allergy    Arthritis    Colon polyps    Depression    Diverticulitis    GERD (gastroesophageal reflux disease)    Sinusitis    Sleep apnea     Objective BP 110/72 (BP Location: Left Arm, Patient Position: Sitting, Cuff Size: Normal)   Pulse 88   Temp 98.2 F (36.8 C) (Oral)   Ht '5\' 8"'$  (1.727 m)   Wt 233 lb 2 oz (105.7 kg)   SpO2 98%   BMI 35.45 kg/m  General: Awake, alert, appears stated age HEENT: AT, [place], ears patent b/l and TM's neg, nares patent w/o discharge, pharynx pink and without exudates, MMM, +ttp over max sinuses b/l Neck: No masses or asymmetry Heart: RRR Lungs: CTAB, no accessory muscle use Psych: Age appropriate judgment and insight, normal mood and affect  Acute maxillary sinusitis, recurrence not specified - Plan: amoxicillin-clavulanate (AUGMENTIN) 875-125 MG tablet  Given duration, will tx w 7 d of Augmentin. Covid and flu testing neg. Continue to push fluids, practice good hand hygiene, cover mouth when coughing. F/u prn. If starting to experience fevers, shaking, or shortness of breath, seek immediate care. Pt voiced understanding and agreement to the plan.  [place], DO 03/11/23 3:27 PM

## 2023-03-29 DIAGNOSIS — G4733 Obstructive sleep apnea (adult) (pediatric): Secondary | ICD-10-CM | POA: Diagnosis not present

## 2023-04-13 ENCOUNTER — Encounter: Payer: Self-pay | Admitting: *Deleted

## 2023-04-29 DIAGNOSIS — J343 Hypertrophy of nasal turbinates: Secondary | ICD-10-CM | POA: Diagnosis not present

## 2023-04-29 DIAGNOSIS — J310 Chronic rhinitis: Secondary | ICD-10-CM | POA: Diagnosis not present

## 2023-05-30 DIAGNOSIS — R051 Acute cough: Secondary | ICD-10-CM | POA: Diagnosis not present

## 2023-05-30 DIAGNOSIS — J069 Acute upper respiratory infection, unspecified: Secondary | ICD-10-CM | POA: Diagnosis not present

## 2023-06-03 DIAGNOSIS — M65341 Trigger finger, right ring finger: Secondary | ICD-10-CM | POA: Diagnosis not present

## 2023-06-17 DIAGNOSIS — M65341 Trigger finger, right ring finger: Secondary | ICD-10-CM | POA: Diagnosis not present

## 2023-07-06 ENCOUNTER — Other Ambulatory Visit: Payer: Self-pay

## 2023-07-06 MED ORDER — PANTOPRAZOLE SODIUM 40 MG PO TBEC: 40.0000 mg | DELAYED_RELEASE_TABLET | Freq: Two times a day (BID) | ORAL | 1 refills | Status: DC

## 2023-07-20 DIAGNOSIS — M79644 Pain in right finger(s): Secondary | ICD-10-CM | POA: Diagnosis not present

## 2023-08-05 DIAGNOSIS — M79644 Pain in right finger(s): Secondary | ICD-10-CM | POA: Diagnosis not present

## 2023-08-19 DIAGNOSIS — M79644 Pain in right finger(s): Secondary | ICD-10-CM | POA: Diagnosis not present

## 2023-09-02 DIAGNOSIS — M79644 Pain in right finger(s): Secondary | ICD-10-CM | POA: Diagnosis not present

## 2023-09-08 ENCOUNTER — Other Ambulatory Visit: Payer: Self-pay

## 2023-09-16 DIAGNOSIS — M79644 Pain in right finger(s): Secondary | ICD-10-CM | POA: Diagnosis not present

## 2023-10-07 ENCOUNTER — Ambulatory Visit: Payer: BC Managed Care – PPO | Admitting: Physician Assistant

## 2023-10-07 VITALS — BP 98/70 | HR 83 | Temp 98.2°F | Resp 18 | Ht 68.0 in | Wt 241.8 lb

## 2023-10-07 DIAGNOSIS — H1131 Conjunctival hemorrhage, right eye: Secondary | ICD-10-CM

## 2023-10-07 DIAGNOSIS — R519 Headache, unspecified: Secondary | ICD-10-CM

## 2023-10-07 LAB — CBC WITH DIFFERENTIAL/PLATELET
Basophils Absolute: 0 10*3/uL (ref 0.0–0.1)
Basophils Relative: 0.6 % (ref 0.0–3.0)
Eosinophils Absolute: 0.1 10*3/uL (ref 0.0–0.7)
Eosinophils Relative: 1.7 % (ref 0.0–5.0)
HCT: 42.7 % (ref 39.0–52.0)
Hemoglobin: 14.5 g/dL (ref 13.0–17.0)
Lymphocytes Relative: 34 % (ref 12.0–46.0)
Lymphs Abs: 1.9 10*3/uL (ref 0.7–4.0)
MCHC: 33.9 g/dL (ref 30.0–36.0)
MCV: 90.7 fL (ref 78.0–100.0)
Monocytes Absolute: 0.9 10*3/uL (ref 0.1–1.0)
Monocytes Relative: 15 % — ABNORMAL HIGH (ref 3.0–12.0)
Neutro Abs: 2.8 10*3/uL (ref 1.4–7.7)
Neutrophils Relative %: 48.7 % (ref 43.0–77.0)
Platelets: 202 10*3/uL (ref 150.0–400.0)
RBC: 4.7 Mil/uL (ref 4.22–5.81)
RDW: 13.3 % (ref 11.5–15.5)
WBC: 5.7 10*3/uL (ref 4.0–10.5)

## 2023-10-07 NOTE — Progress Notes (Signed)
Established patient visit   Patient: Scott Phillips   DOB: [birth year]   52 y.o. Male  MRN: [id number] Visit Date: 10/07/2023  Today's healthcare provider: Alfredia Ferguson, PA-C   Chief Complaint  Patient presents with   Eye Problem    Right eye only, sxs started 3 weeks ago. Pt states sxs are off and on. Pt states having headache, neck pain/stiffness this morning. Pt reports no falls or injuries and no changes to eye site.   Subjective    HPI Discussed the use of AI scribe software for clinical note transcription with the patient, who gave verbal consent to proceed.  History of Present Illness   The patient presents with right eye redness for three weeks. Initially, he attributed the redness to allergies, as he had sneezed excessively the night before the onset. The redness resolved over a few weeks but recurred this morning. The patient denies any significant itching or pain associated with the redness. He uses eye drops for dryness, most recently used yesterday. Denies vision changes, pain w/ eye movement.   The patient also reports recent headaches and neck pain, which he believes may be related to a recent dental crown and subsequent teeth grinding. The headaches were severe enough to cause nausea and loss of appetite, but have since improved to a mild intensity. Today he rates the headache 4/10 and has not taken any otc medicine. Since having his dental work the headaches and neck pain have improved. The patient has started using a night guard and finds it helpful.  The patient denies any recent illness, vomiting, or coughing. He wears glasses primarily denies contact use.        Medications: Outpatient Medications Prior to Visit  Medication Sig   b complex vitamins tablet Take 1 tablet by mouth 3 (three) times a week.   Calcium-Magnesium-Zinc (CALCIUM-MAGNESUIUM-ZINC PO) Take by mouth 2 (two) times daily.   Carbinoxamine Maleate 4 MG TABS Take 1 tablet (4 mg total) by  mouth every 6 (six) hours as needed.   famotidine (PEPCID) 20 MG tablet Take 1 tablet (20 mg total) by mouth 2 (two) times daily before a meal.   melatonin 5 MG TABS Take 5 mg by mouth at bedtime.   METAMUCIL FIBER PO Take by mouth.   Misc Natural Products (TURMERIC CURCUMIN) CAPS Take 1,000 each by mouth.   Multiple Vitamin (MULTIVITAMIN ADULT PO) Take by mouth.   pantoprazole (PROTONIX) 40 MG tablet Take 1 tablet (40 mg total) by mouth 2 (two) times daily before a meal.   Probiotic Product (PROBIOTIC-10 PO) Take by mouth.   [DISCONTINUED] XHANCE 93 MCG/ACT EXHU Place 2 puffs into the nose 2 (two) times daily as needed.   Facility-Administered Medications Prior to Visit  Medication Dose Route Frequency Provider   0.9 %  sodium chloride infusion  500 mL Intravenous Once Cirigliano, Vito V, DO    Review of Systems  Constitutional:  Negative for fatigue and fever.  Respiratory:  Negative for cough and shortness of breath.   Cardiovascular:  Negative for chest pain, palpitations and leg swelling.  Neurological:  Negative for dizziness and headaches.      Objective    BP 98/70 (BP Location: Left Arm, Patient Position: Sitting, Cuff Size: Large)   Pulse 83   Temp 98.2 F (36.8 C) (Oral)   Resp 18   Ht 5\' 8"  (1.727 m)   Wt 241 lb 12.8 oz (109.7 kg)   SpO2 98%  BMI 36.77 kg/m   Physical Exam Vitals reviewed.  Constitutional:      Appearance: He is not ill-appearing.  HENT:     Head: Normocephalic.  Eyes:     Extraocular Movements: Extraocular movements intact.     Conjunctiva/sclera:     Right eye: Hemorrhage present.     Pupils: Pupils are equal, round, and reactive to light.     Comments: Bleeding appears petechial, lateral right eye.  No FB visualized.  Cardiovascular:     Rate and Rhythm: Normal rate.  Pulmonary:     Effort: Pulmonary effort is normal. No respiratory distress.  Neurological:     General: No focal deficit present.     Mental Status: He is alert and  oriented to person, place, and time.  Psychiatric:        Mood and Affect: Mood normal.        Behavior: Behavior normal.      No results found for any visits on 10/07/23.  Assessment & Plan     1. Subconjunctival hemorrhage of right eye -likely subconjunctival hemorrhage, but unprompted and bleeding appears more petechial.  -Order CBC to check platelet count. -Refer to ophthalmologist for further evaluation.  - Ambulatory referral to Ophthalmology - CBC w/Diff  2. Acute nonintractable headache, unspecified headache type -Continue current management strategies, including use of a night guard for potential teeth grinding. Symptoms have improved since dental work.  Afebrile, normal vitals today.  Return if symptoms worsen or fail to improve.      I, Alfredia Ferguson, PA-C have reviewed all documentation for this visit. The documentation on  10/07/23   for the exam, diagnosis, procedures, and orders are all accurate and complete.    Alfredia Ferguson, PA-C  [facility] [phone] (phone) [phone] (fax)  [facility]

## 2023-10-08 DIAGNOSIS — H1131 Conjunctival hemorrhage, right eye: Secondary | ICD-10-CM | POA: Diagnosis not present

## 2023-10-09 ENCOUNTER — Encounter: Payer: Self-pay | Admitting: Internal Medicine

## 2023-10-09 DIAGNOSIS — Z0000 Encounter for general adult medical examination without abnormal findings: Secondary | ICD-10-CM

## 2023-10-09 DIAGNOSIS — E785 Hyperlipidemia, unspecified: Secondary | ICD-10-CM

## 2023-10-12 DIAGNOSIS — M65342 Trigger finger, left ring finger: Secondary | ICD-10-CM | POA: Diagnosis not present

## 2023-10-12 NOTE — Telephone Encounter (Signed)
CMP FLP PSA

## 2023-10-12 NOTE — Addendum Note (Signed)
Addended byConrad Abrams D on: 10/12/2023 04:43 PM   Modules accepted: Orders

## 2023-10-12 NOTE — Telephone Encounter (Signed)
Orders placed.

## 2023-10-21 ENCOUNTER — Other Ambulatory Visit (INDEPENDENT_AMBULATORY_CARE_PROVIDER_SITE_OTHER): Payer: BC Managed Care – PPO

## 2023-10-21 DIAGNOSIS — E785 Hyperlipidemia, unspecified: Secondary | ICD-10-CM

## 2023-10-21 DIAGNOSIS — Z0000 Encounter for general adult medical examination without abnormal findings: Secondary | ICD-10-CM

## 2023-10-21 LAB — COMPREHENSIVE METABOLIC PANEL
ALT: 45 U/L (ref 0–53)
AST: 27 U/L (ref 0–37)
Albumin: 4.4 g/dL (ref 3.5–5.2)
Alkaline Phosphatase: 55 U/L (ref 39–117)
BUN: 16 mg/dL (ref 6–23)
CO2: 28 meq/L (ref 19–32)
Calcium: 9.5 mg/dL (ref 8.4–10.5)
Chloride: 100 meq/L (ref 96–112)
Creatinine, Ser: 0.98 mg/dL (ref 0.40–1.50)
GFR: 90.26 mL/min (ref >60.00)
Glucose, Bld: 98 mg/dL (ref 70–99)
Potassium: 4.2 meq/L (ref 3.5–5.1)
Sodium: 137 meq/L (ref 135–145)
Total Bilirubin: 0.8 mg/dL (ref 0.2–1.2)
Total Protein: 7.3 g/dL (ref 6.0–8.3)

## 2023-10-21 LAB — LIPID PANEL
Cholesterol: 203 mg/dL — ABNORMAL HIGH (ref 0–200)
HDL: 51.8 mg/dL (ref >39.00)
LDL Cholesterol: 132 mg/dL — ABNORMAL HIGH (ref 0–99)
NonHDL: 150.73
Total CHOL/HDL Ratio: 4
Triglycerides: 93 mg/dL (ref 0.0–149.0)
VLDL: 18.6 mg/dL (ref 0.0–40.0)

## 2023-10-21 LAB — PSA: PSA: 0.56 ng/mL (ref 0.10–4.00)

## 2023-10-23 ENCOUNTER — Ambulatory Visit: Payer: BC Managed Care – PPO | Admitting: Internal Medicine

## 2023-10-28 ENCOUNTER — Other Ambulatory Visit (HOSPITAL_BASED_OUTPATIENT_CLINIC_OR_DEPARTMENT_OTHER): Payer: Self-pay

## 2023-10-28 ENCOUNTER — Other Ambulatory Visit (HOSPITAL_COMMUNITY): Payer: Self-pay

## 2023-10-28 ENCOUNTER — Ambulatory Visit (INDEPENDENT_AMBULATORY_CARE_PROVIDER_SITE_OTHER): Payer: BC Managed Care – PPO | Admitting: Internal Medicine

## 2023-10-28 ENCOUNTER — Encounter: Payer: Self-pay | Admitting: Internal Medicine

## 2023-10-28 ENCOUNTER — Telehealth: Payer: Self-pay

## 2023-10-28 VITALS — BP 120/70 | HR 84 | Temp 98.2°F | Resp 16 | Ht 68.0 in | Wt 239.4 lb

## 2023-10-28 DIAGNOSIS — E785 Hyperlipidemia, unspecified: Secondary | ICD-10-CM

## 2023-10-28 DIAGNOSIS — Z23 Encounter for immunization: Secondary | ICD-10-CM

## 2023-10-28 DIAGNOSIS — Z0000 Encounter for general adult medical examination without abnormal findings: Secondary | ICD-10-CM

## 2023-10-28 MED ORDER — CARBINOXAMINE MALEATE 4 MG PO TABS: 4.0000 mg | ORAL_TABLET | Freq: Four times a day (QID) | ORAL | 3 refills | Status: DC | PRN

## 2023-10-28 MED ORDER — COVID-19 MRNA VAC-TRIS(PFIZER) 30 MCG/0.3ML IM SUSY
0.3000 mL | PREFILLED_SYRINGE | Freq: Once | INTRAMUSCULAR | 0 refills | Status: AC
Start: 2023-10-28 — End: 2023-10-29
  Filled 2023-10-28: qty 0.3, 1d supply, fill #0

## 2023-10-28 NOTE — Assessment & Plan Note (Signed)
Here for CPX -Td 2018 - flu shot today - rec covid booster  -CCS: C-scope  09/28/2017:  Cecum polyp, diverticulosis, BX: Sessile serrated polyp.  Cscope 12/2022, next per GI -Prostate cancer screening:  start screening at age 50. - Recent labs reviewed, all WNL, LDL has definitely increased.   -Diet exercise: not doing well for the last few months, he is determined to go back to routine exercise.  Would like to recheck his cholesterol in January.

## 2023-10-28 NOTE — Progress Notes (Signed)
Subjective:    Patient ID: Scott Phillips, male    DOB: 06/13/1971, 52 y.o.   MRN: [id number]  DOS:  10/28/2023 Type of visit - description: cpx  Here for CPX Not doing well with diet or exercise in the last few months, + weight gain. Allergies well-controlled, requesting refill. Has developed right knee pain, mild, located at the lateral side of the knee.  No swelling, no injury.  Wt Readings from Last 3 Encounters:  10/28/23 239 lb 6 oz (108.6 kg)  10/07/23 241 lb 12.8 oz (109.7 kg)  03/11/23 233 lb 2 oz (105.7 kg)    Review of Systems  Other than above, a 14 point review of systems is negative     Past Medical History:  Diagnosis Date   Allergic rhinosinusitis    Allergy    Arthritis    Colon polyps    Depression    Diverticulitis    GERD (gastroesophageal reflux disease)    Sinusitis    Sleep apnea     Past Surgical History:  Procedure Laterality Date   CARPAL TUNNEL RELEASE     KNEE ARTHROSCOPY Right 1991   cyst removal   MAXILLARY ANTROSTOMY Right 06/29/2020   Procedure: RIGHT MAXILLARY ANTROSTOMY WITH TISSUE REMOVAL;  Surgeon: Newman Pies, MD;  Location: [facility];  Service: ENT;  Laterality: Right;   NASAL SINUS SURGERY     x3 in [place]   SUBACROMIAL DECOMPRESSION Left    TRIGGER FINGER RELEASE Right 2018   R 3rd   VASECTOMY  2019   Social History   Socioeconomic History   Marital status: Married    Spouse name: Not on file   Number of children: 1   Years of education: Not on file   Highest education level: Bachelor's degree (e.g., BA, AB, BS)  Occupational History   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  Tobacco Use   Smoking status: Former    Current packs/day: 0.00    Average packs/day: 2.0 packs/day for 23.0 years (46.0 ttl pk-yrs)    Types: Cigarettes    Start date: 12/30/1991    Quit date: 12/29/2014    Years since quitting: 8.8   Smokeless tobacco: Never  Vaping Use   Vaping status: Never Used  Substance  and Sexual Activity   Alcohol use: Yes    Comment: occas   Drug use: Never   Sexual activity: Not on file  Other Topics Concern   Not on file  Social History Narrative   Household: pt, wife and Daughter born  2018   Social Determinants of Health   Financial Resource Strain: Low Risk  (10/07/2023)   Overall Financial Resource Strain (CARDIA)    Difficulty of Paying Living Expenses: Not hard at all  Food Insecurity: No Food Insecurity (10/07/2023)   Hunger Vital Sign    Worried About Running Out of Food in the Last Year: Never true    Ran Out of Food in the Last Year: Never true  Transportation Needs: No Transportation Needs (10/07/2023)   PRAPARE - Administrator, Civil Service (Medical): No    Lack of Transportation (Non-Medical): No  Physical Activity: Insufficiently Active (10/07/2023)   Exercise Vital Sign    Days of Exercise per Week: 1 day    Minutes of Exercise per Session: 10 min  Stress: Stress Concern Present (10/07/2023)   [employer] of Occupational Health - Occupational Stress Questionnaire    Feeling of Stress :  Rather much  Social Connections: Moderately Integrated (10/07/2023)   Social Connection and Isolation Panel [NHANES]    Frequency of Communication with Friends and Family: Twice a week    Frequency of Social Gatherings with Friends and Family: Once a week    Attends Religious Services: More than 4 times per year    Active Member of [employer] or Organizations: No    Attends Engineer, structural: Not on file    Marital Status: Married  Intimate Partner Violence: Unknown (04/20/2020)   Received from [facility]   Intimate Partner Violence    Fear of Current or Ex-Partner: Not asked    Emotionally Abused: Not asked    Physically Abused: Not asked    Sexually Abused: Not asked     Current Outpatient Medications  Medication Instructions   b complex vitamins tablet 1 tablet, Oral, 3 times weekly   Calcium-Magnesium-Zinc  (CALCIUM-MAGNESUIUM-ZINC PO) Oral, 2 times daily   Carbinoxamine Maleate 4 mg, Oral, Every 6 hours PRN   COVID-19 mRNA vaccine, Pfizer, (COMIRNATY) syringe 0.3 mLs, Intramuscular,  Once   famotidine (PEPCID) 20 mg, Oral, 2 times daily before meals   melatonin 5 mg, Oral, Daily at bedtime   METAMUCIL FIBER PO Oral   Misc Natural Products (TURMERIC CURCUMIN) CAPS 1,000 each, Oral   Multiple Vitamin (MULTIVITAMIN ADULT PO) Oral   pantoprazole (PROTONIX) 40 mg, Oral, 2 times daily before meals   Probiotic Product (PROBIOTIC-10 PO) Oral       Objective:   Physical Exam BP 120/70   Pulse 84   Temp 98.2 F (36.8 C) (Oral)   Resp 16   Ht 5\' 8"  (1.727 m)   Wt 239 lb 6 oz (108.6 kg)   SpO2 96%   BMI 36.40 kg/m  General: Well developed, NAD, BMI noted Neck: No  thyromegaly  HEENT:  Normocephalic . Face symmetric, atraumatic Lungs:  CTA B Normal respiratory effort, no intercostal retractions, no accessory muscle use. Heart: RRR,  no murmur.  Abdomen:  Not distended, soft, non-tender. No rebound or rigidity.   Lower extremities: L knee normal R knee: Range of motion normal, no deformities, no effusion.  No TTP. Skin: Exposed areas without rash. Not pale. Not jaundice Neurologic:  alert & oriented X3.  Speech normal, gait appropriate for age and unassisted Strength symmetric and appropriate for age.  Psych: Cognition and judgment appear intact.  Cooperative with normal attention span and concentration.  Behavior appropriate. No anxious or depressed appearing.     Assessment     ASSESSMENT New patient 06-2020, moved from [place]. OSA , dx ~2010, +CPAP GERD: had EGD in [place] per pt remotely (2006?) Colon Polyps H/o Diverticulitis  H/o Depression Allergies  Sinus issues: Surgery 2012, surgery 06/2020   PLAN Here for CPX -Td 2018 - flu shot today - rec covid booster  -CCS: C-scope  09/28/2017:  Cecum polyp, diverticulosis, BX: Sessile serrated polyp.  Cscope  12/2022, next per GI -Prostate cancer screening:  start screening at age 21. - Recent labs reviewed, all WNL, LDL has definitely increased.   -Diet exercise: not doing well for the last few months, he is determined to go back to routine exercise.  Would like to recheck his cholesterol in January. OSA: Good CPAP compliance Allergies, well-controlled with carbinoxamine, requesting refill, sent. Right knee pain: Exam is benign, symptoms are mild, we agreed on the stretching, start exercise program, if not better will call for further eval.  Sports medicine?Marland Kitchen RTC  labs January 2025 RTC CPX 1 year

## 2023-10-28 NOTE — Assessment & Plan Note (Signed)
Here for CPX OSA: Good CPAP compliance Allergies, well-controlled with carbinoxamine, requesting refill, sent. Right knee pain: Exam is benign, symptoms are mild, we agreed on the stretching, start exercise program, if not better will call for further eval.  Sports medicine?. RTC labs January 2025 RTC CPX 1 year

## 2023-10-28 NOTE — Patient Instructions (Signed)
    Go to the front desk. -Arrange for blood work to be done in January, we will recheck your cholesterol. -Next visit with me in 1 year for a physical exam    -

## 2023-10-28 NOTE — Telephone Encounter (Signed)
Pt needs PA please.   Has failed cetirizine 10mg 

## 2023-10-29 ENCOUNTER — Telehealth: Payer: Self-pay

## 2023-10-29 ENCOUNTER — Other Ambulatory Visit (HOSPITAL_COMMUNITY): Payer: Self-pay

## 2023-10-29 NOTE — Telephone Encounter (Signed)
PA request has been Submitted. New Encounter created for follow up. For additional info see Pharmacy Prior Auth telephone encounter from 10/29/23.

## 2023-10-29 NOTE — Telephone Encounter (Signed)
Pharmacy Patient Advocate Encounter   Received notification from Pt Calls Messages that prior authorization for Carbinoxamine Maleate 4MG  tablets is required/requested.   Insurance verification completed.   The patient is insured through [employer] .   Per test claim: PA required; PA submitted to above mentioned insurance via CoverMyMeds Key/confirmation #/EOC [id number] Status is pending

## 2023-10-30 ENCOUNTER — Encounter: Payer: Self-pay | Admitting: Internal Medicine

## 2023-10-30 ENCOUNTER — Ambulatory Visit: Payer: BC Managed Care – PPO | Admitting: Family

## 2023-10-30 VITALS — BP 122/74 | HR 82 | Resp 18 | Ht 68.0 in | Wt 241.6 lb

## 2023-10-30 DIAGNOSIS — H1131 Conjunctival hemorrhage, right eye: Secondary | ICD-10-CM

## 2023-10-30 NOTE — Telephone Encounter (Signed)
Inform patient of his insurance decision. From my standpoint okay to try Xyzal 5 mg 1 tablet daily, send 1 year supply.

## 2023-10-30 NOTE — Telephone Encounter (Signed)
PA denied.   Pt must try and fail 3 of the following- cetirizine solution, cyproheptadine tablets/syrup, desloratadine tablet [generic Clarinex], desloratadine orally disintegrating tablet [ODT], and levocetirizine tablet [generic Xyzal])   He has tried and failed cetirizine tablets in the past.

## 2023-10-30 NOTE — Progress Notes (Signed)
Scott Phillips is a 52 y.o. male with the following history as recorded in EpicCare:  Patient Active Problem List   Diagnosis Date Noted   Allergic conjunctivitis of both eyes 04/23/2022   Patellofemoral pain syndrome of right knee 01/16/2021   Perennial allergic rhinitis 11/15/2020   Chronic sinusitis 11/15/2020   Allergic conjunctivitis 11/15/2020   Annual physical exam 10/14/2020   Serrated polyp of colon 07/13/2020   PCP notes >>>>>>>>>> 07/13/2020   GERD 06/26/2020   OSA (obstructive sleep apnea) 06/26/2020   HPV (human papilloma virus) anogenital infection 04/02/2001    Current Outpatient Medications  Medication Sig Dispense Refill   b complex vitamins tablet Take 1 tablet by mouth 3 (three) times a week.     Calcium-Magnesium-Zinc (CALCIUM-MAGNESUIUM-ZINC PO) Take by mouth 2 (two) times daily.     Carbinoxamine Maleate 4 MG TABS Take 1 tablet (4 mg total) by mouth every 6 (six) hours as needed. 60 tablet 3   famotidine (PEPCID) 20 MG tablet Take 1 tablet (20 mg total) by mouth 2 (two) times daily before a meal. 180 tablet 1   melatonin 5 MG TABS Take 5 mg by mouth at bedtime.     METAMUCIL FIBER PO Take by mouth.     Misc Natural Products (TURMERIC CURCUMIN) CAPS Take 1,000 each by mouth.     Multiple Vitamin (MULTIVITAMIN ADULT PO) Take by mouth.     pantoprazole (PROTONIX) 40 MG tablet Take 1 tablet (40 mg total) by mouth 2 (two) times daily before a meal. 180 tablet 1   Probiotic Product (PROBIOTIC-10 PO) Take by mouth.     No current facility-administered medications for this visit.    Allergies: Milk-related compounds  Past Medical History:  Diagnosis Date   Allergic rhinosinusitis    Allergy    Arthritis    Colon polyps    Depression    Diverticulitis    GERD (gastroesophageal reflux disease)    Sinusitis    Sleep apnea     Past Surgical History:  Procedure Laterality Date   CARPAL TUNNEL RELEASE     KNEE ARTHROSCOPY Right 1991   cyst removal    MAXILLARY ANTROSTOMY Right 06/29/2020   Procedure: RIGHT MAXILLARY ANTROSTOMY WITH TISSUE REMOVAL;  Surgeon: Newman Pies, MD;  Location: [facility];  Service: ENT;  Laterality: Right;   NASAL SINUS SURGERY     x3 in [place]   SUBACROMIAL DECOMPRESSION Left    TRIGGER FINGER RELEASE Right 2018   R 3rd   VASECTOMY  2019    Family History  Problem Relation Age of Onset   Cancer Mother    Depression Mother    Hypertension Mother    Miscarriages / [place] Mother    Cancer Father    Alcohol abuse Father    Arthritis Father    Diabetes Father    Hearing loss Father    Allergic rhinitis Sister    Depression Maternal Grandmother    Hearing loss Maternal Grandfather    Asthma Maternal Grandfather    Diabetes Paternal Grandfather    Colon cancer Neg Hx    Prostate cancer Neg Hx    CAD Neg Hx     Social History   Tobacco Use   Smoking status: Former    Current packs/day: 0.00    Average packs/day: 2.0 packs/day for 23.0 years (46.0 ttl pk-yrs)    Types: Cigarettes    Start date: 12/30/1991    Quit date: 12/29/2014  Years since quitting: 8.8   Smokeless tobacco: Never  Substance Use Topics   Alcohol use: Yes    Comment: occas    Subjective:  Seen with subconjunctival hemorrhage of right eye in early October; notes that recent eye exam was normal; notes that same symptoms occurred yesterday in the right eye again; no trauma, coughing or heavy lifting; no vision changes; has realized that is grinding teeth at night/ needs to have crown replaced on right side of mouth;   Objective:  Vitals:   10/30/23 1138  BP: 122/74  Pulse: 82  Resp: 18  SpO2: 97%  Weight: 241 lb 9.6 oz (109.6 kg)  Height: 5\' 8"  (1.727 m)    General: Well developed, well nourished, in no acute distress  Skin : Warm and dry.  Head: Normocephalic and atraumatic  Eyes: Subconjunctival hemorrhage noted in right eye; pupils round and reactive to light; extraocular movements intact  Lungs:  Respirations unlabored; clear to auscultation bilaterally without wheeze, rales, rhonchi  CVS exam: normal rate and regular rhythm.  Neurologic: Alert and oriented; speech intact; face symmetrical; moves all extremities well; CNII-XII intact without focal deficit  No carotid bruits noted  Assessment:  1. Subconjunctival hemorrhage of right eye     Plan:  ? Related to need for dental work/ different nighttime mouth guard; physical exam and blood pressure are reassuring; he will reach back out to his eye doctor to see about need for complete dilated exam- follow up with his PCP as directed by ophthalmologist.   No follow-ups on file.  No orders of the defined types were placed in this encounter.   Requested Prescriptions    No prescriptions requested or ordered in this encounter

## 2023-10-30 NOTE — Telephone Encounter (Signed)
Pharmacy Patient Advocate Encounter  Received notification from [facility] that Prior Authorization for Carbinoxamine Maleate 4MG  tablets has been DENIED.  Full denial letter will be uploaded to the media tab. See denial reason below.   PA #/Case ID/Reference #: [id number]    DENIAL REASON: We denied your request because we did not see what we need to approve the drug you asked for, (carbinoxamine maleate). We may be able to approve this drug when you have tried other drugs first (after a trial and inadequate response or intolerance to three preferred prescription generic oral antihistamine: cetirizine solution, cyproheptadine tablets/syrup, desloratadine tablet [generic Clarinex], desloratadine orally disintegrating tablet [ODT], and levocetirizine tablet [generic Xyzal]).   Please be advised we currently do not have a Pharmacist to review denials, therefore you will need to process appeals accordingly as needed. Thanks for your support at this time. Contact for appeals are as follows: Phone: [phone], Fax: [phone]

## 2023-10-31 DIAGNOSIS — J029 Acute pharyngitis, unspecified: Secondary | ICD-10-CM | POA: Diagnosis not present

## 2023-10-31 DIAGNOSIS — J209 Acute bronchitis, unspecified: Secondary | ICD-10-CM | POA: Diagnosis not present

## 2023-11-02 ENCOUNTER — Encounter: Payer: Self-pay | Admitting: Internal Medicine

## 2023-11-04 DIAGNOSIS — D2372 Other benign neoplasm of skin of left lower limb, including hip: Secondary | ICD-10-CM | POA: Diagnosis not present

## 2023-11-04 DIAGNOSIS — L814 Other melanin hyperpigmentation: Secondary | ICD-10-CM | POA: Diagnosis not present

## 2023-11-04 DIAGNOSIS — D225 Melanocytic nevi of trunk: Secondary | ICD-10-CM | POA: Diagnosis not present

## 2023-11-11 ENCOUNTER — Ambulatory Visit (INDEPENDENT_AMBULATORY_CARE_PROVIDER_SITE_OTHER): Payer: BC Managed Care – PPO

## 2023-11-18 ENCOUNTER — Ambulatory Visit (INDEPENDENT_AMBULATORY_CARE_PROVIDER_SITE_OTHER): Payer: BC Managed Care – PPO | Admitting: Otolaryngology

## 2023-11-18 ENCOUNTER — Encounter (INDEPENDENT_AMBULATORY_CARE_PROVIDER_SITE_OTHER): Payer: Self-pay

## 2023-11-18 VITALS — Ht 68.0 in | Wt 245.0 lb

## 2023-11-18 DIAGNOSIS — J343 Hypertrophy of nasal turbinates: Secondary | ICD-10-CM | POA: Insufficient documentation

## 2023-11-18 DIAGNOSIS — J310 Chronic rhinitis: Secondary | ICD-10-CM | POA: Diagnosis not present

## 2023-11-18 DIAGNOSIS — R0981 Nasal congestion: Secondary | ICD-10-CM

## 2023-11-18 DIAGNOSIS — J320 Chronic maxillary sinusitis: Secondary | ICD-10-CM

## 2023-11-18 NOTE — Progress Notes (Signed)
Patient ID: Scott Phillips, male   DOB: Aug 04, 1971, 52 y.o.   MRN: [id number]  Follow up: Chronic maxillary sinusitis, fungus ball  HPI: The patient is a 52 year old male who returns today for his follow-up evaluation.  The patient has a history of chronic right maxillary sinusitis.  He underwent a right maxillary antrostomy surgery in July 2021.  At his last visit 6 months ago, his right maxillary antrum was widely patent.  No infection was noted.  He was noted to have chronic rhinitis with diffuse nasal mucosal congestion.  He was continued on his steroid nasal spray.  The patient returns today reporting occasional nasal congestion during the fall allergy season.  Currently he denies any facial pain or fever.  Exam: General: Communicates without difficulty, well nourished, no acute distress. Head: Normocephalic, no evidence injury, no tenderness, facial buttresses intact without stepoff. Face/sinus: No tenderness to palpation and percussion. Facial movement is normal and symmetric. Eyes: PERRL, EOMI. No scleral icterus, conjunctivae clear. Neuro: CN II exam reveals vision grossly intact.  No nystagmus at any point of gaze. Ears: Auricles well formed without lesions.  Ear canals are intact without mass or lesion.  No erythema or edema is appreciated.  The TMs are intact without fluid. Nose: External evaluation reveals normal support and skin without lesions.  Dorsum is intact.  Anterior rhinoscopy reveals congested mucosa over anterior aspect of inferior turbinates and intact septum.  No purulence noted. Oral:  Oral cavity and oropharynx are intact, symmetric, without erythema or edema.  Mucosa is moist without lesions. Neck: Full range of motion without pain.  There is no significant lymphadenopathy.  No masses palpable.  Thyroid bed within normal limits to palpation.  Parotid glands and submandibular glands equal bilaterally without mass.  Trachea is midline. Neuro:  CN 2-12 grossly intact.     Assessment: 1.  Chronic rhinitis with diffuse nasal mucosal congestion. 2.  No purulent drainage is noted.   Plan: 1.  The physical exam findings are reviewed with the patient.  2.  Continue with nasal saline irrigation and Xhance nasal spray.  3.  The patient will return for re-evaluation in 6 months, sooner if needed.

## 2023-12-21 ENCOUNTER — Ambulatory Visit: Payer: BC Managed Care – PPO | Admitting: Urgent Care

## 2023-12-21 ENCOUNTER — Telehealth: Payer: Self-pay

## 2023-12-21 VITALS — BP 117/75 | HR 75 | Temp 98.2°F | Wt 248.0 lb

## 2023-12-21 DIAGNOSIS — K122 Cellulitis and abscess of mouth: Secondary | ICD-10-CM

## 2023-12-21 DIAGNOSIS — J22 Unspecified acute lower respiratory infection: Secondary | ICD-10-CM

## 2023-12-21 DIAGNOSIS — H66003 Acute suppurative otitis media without spontaneous rupture of ear drum, bilateral: Secondary | ICD-10-CM

## 2023-12-21 MED ORDER — AZITHROMYCIN 250 MG PO TABS: ORAL_TABLET | ORAL | 0 refills | Status: AC

## 2023-12-21 MED ORDER — AMOXICILLIN-POT CLAVULANATE 875-125 MG PO TABS
1.0000 | ORAL_TABLET | Freq: Two times a day (BID) | ORAL | 0 refills | Status: AC
Start: 2023-12-21 — End: 2023-12-28

## 2023-12-21 NOTE — Telephone Encounter (Signed)
Caller Name Altin Brisco Caller Phone Number [phone] Patient Name Scott Phillips Patient DOB 1971/01/18 Call Type Message Only Information Provided Reason for Call Request to Schedule Office Appointment Initial Comment Caller needs to schedule an appt, declined triage. Patient request to speak to RN No Additional Comment Provided office hours. Disp. Time Disposition Final User 12/21/2023 7:08:21 AM General Information Provided Yes Donnetta Hutching Call Closed By: Donnetta Hutching Transaction Date/Time: 12/21/2023 7:06:16 AM (ET)

## 2023-12-21 NOTE — Telephone Encounter (Signed)
Appt scheduled w/ Alphonzo Lemmings

## 2023-12-21 NOTE — Progress Notes (Signed)
Established Patient Office Visit  Subjective:  Patient ID: Scott Phillips, male    DOB: Dec 23, 1971  Age: 52 y.o. MRN: [id number]  Chief Complaint  Patient presents with   Sore Throat    Sore throat for about 10 days. Last few days he has had yellow phlegm along with sinus drainage and ear pain and fullness.   Discussed the use of AI software for clinical note transcription with the patient who gave verbal consent to proceed.  Pleasant 52yo male with a history of chronic sinus issues and multiple sinus surgeries, the most recent in 2021, presents with a sore throat of approximately ten days duration. He attributes the sore throat to significant post-nasal drainage, but denies fever or other systemic symptoms. Late last Friday, he developed bilateral ear pain, which has progressively worsened over the past few days, with the left ear being more significantly affected.  The patient denies sneezing excessively but confirms consistent nasal drainage down the back of the throat, causing a mild cough productive of yellow mucus. He denies shortness of breath, chest pain, wheezing, fever, and chills.  The patient has a history of sinus issues, with multiple surgeries prior to 2021, but denies any known triggers or allergens. He reports recent exposure to a symptomatic six-year-old child. The patient had a recent bout of mycoplasma infection about a month ago, treated with a Z-Pak and prednisone.  The patient uses Flonase for chronic sinus issues and denies any antibiotic allergies, with only a known allergy to milk. He reports no recent travel on an airplane.   Sore Throat     Patient Active Problem List   Diagnosis Date Noted   Hypertrophy of nasal turbinates 11/18/2023   Allergic conjunctivitis of both eyes 04/23/2022   Patellofemoral pain syndrome of right knee 01/16/2021   Perennial allergic rhinitis 11/15/2020   Chronic maxillary sinusitis 11/15/2020   Allergic conjunctivitis  11/15/2020   Annual physical exam 10/14/2020   Serrated polyp of colon 07/13/2020   PCP notes >>>>>>>>>> 07/13/2020   GERD 06/26/2020   OSA (obstructive sleep apnea) 06/26/2020   HPV (human papilloma virus) anogenital infection 04/02/2001   Past Medical History:  Diagnosis Date   Allergic rhinosinusitis    Allergy    Arthritis    Colon polyps    Depression    Diverticulitis    GERD (gastroesophageal reflux disease)    Sinusitis    Sleep apnea    Past Surgical History:  Procedure Laterality Date   CARPAL TUNNEL RELEASE     KNEE ARTHROSCOPY Right 1991   cyst removal   MAXILLARY ANTROSTOMY Right 06/29/2020   Procedure: RIGHT MAXILLARY ANTROSTOMY WITH TISSUE REMOVAL;  Surgeon: Newman Pies, MD;  Location: [facility];  Service: ENT;  Laterality: Right;   NASAL SINUS SURGERY     x3 in [place]   SUBACROMIAL DECOMPRESSION Left    TRIGGER FINGER RELEASE Right 2018   R 3rd   VASECTOMY  2019   Social History   Tobacco Use   Smoking status: Former    Current packs/day: 0.00    Average packs/day: 2.0 packs/day for 23.0 years (46.0 ttl pk-yrs)    Types: Cigarettes    Start date: 12/30/1991    Quit date: 12/29/2014    Years since quitting: 8.9   Smokeless tobacco: Never  Vaping Use   Vaping status: Never Used  Substance Use Topics   Alcohol use: Yes    Comment: occas   Drug use: Never  ROS: as noted in HPI  Objective:     BP 117/75   Pulse 75   Temp 98.2 F (36.8 C) (Oral)   Wt 248 lb (112.5 kg)   SpO2 97%   BMI 37.71 kg/m  BP Readings from Last 3 Encounters:  12/21/23 117/75  10/30/23 122/74  10/28/23 120/70   Wt Readings from Last 3 Encounters:  12/21/23 248 lb (112.5 kg)  11/18/23 245 lb (111.1 kg)  10/30/23 241 lb 9.6 oz (109.6 kg)      Physical Exam Vitals and nursing note reviewed.  Constitutional:      General: He is not in acute distress.    Appearance: He is well-developed. He is not ill-appearing, toxic-appearing or  diaphoretic.  HENT:     Head: Normocephalic and atraumatic.     Salivary Glands: Right salivary gland is not diffusely enlarged or tender. Left salivary gland is not diffusely enlarged or tender.     Right Ear: No swelling or tenderness. A middle ear effusion is present. There is no impacted cerumen. No foreign body. No mastoid tenderness. Tympanic membrane is injected and erythematous. Tympanic membrane is not perforated or bulging.     Left Ear: No swelling or tenderness. A middle ear effusion is present. There is no impacted cerumen. No foreign body. No mastoid tenderness. Tympanic membrane is injected, erythematous and bulging (developing bleb). Tympanic membrane is not perforated.     Nose: Congestion and rhinorrhea present.     Right Turbinates: Enlarged and swollen.     Left Turbinates: Enlarged and swollen.     Right Sinus: No maxillary sinus tenderness or frontal sinus tenderness.     Left Sinus: No maxillary sinus tenderness or frontal sinus tenderness.     Mouth/Throat:     Lips: Pink.     Mouth: Mucous membranes are moist. No oral lesions.     Pharynx: Oropharynx is clear. Uvula midline. Posterior oropharyngeal erythema, uvula swelling and postnasal drip present. No pharyngeal swelling or oropharyngeal exudate.  Cardiovascular:     Rate and Rhythm: Normal rate and regular rhythm.  Pulmonary:     Effort: Pulmonary effort is normal. No respiratory distress.     Breath sounds: Normal breath sounds. No stridor. No wheezing, rhonchi or rales.  Chest:     Chest wall: No tenderness.  Musculoskeletal:     Cervical back: Normal range of motion and neck supple. No rigidity or tenderness.  Lymphadenopathy:     Cervical: No cervical adenopathy.  Skin:    General: Skin is warm and dry.     Coloration: Skin is not jaundiced.     Findings: No bruising, erythema or rash.  Neurological:     General: No focal deficit present.     Mental Status: He is alert and oriented to person, place, and  time.     Gait: Gait normal.      No results found for any visits on 12/21/23.    The 10-year ASCVD risk score (Arnett DK, et al., 2019) is: 2.9%  Assessment & Plan:  Non-recurrent acute suppurative otitis media of both ears without spontaneous rupture of tympanic membranes -     Amoxicillin-Pot Clavulanate; Take 1 tablet by mouth 2 (two) times daily with a meal for 7 days.  Dispense: 14 tablet; Refill: 0 -     Azithromycin; Take 2 tablets on day 1, then 1 tablet daily on days 2 through 5  Dispense: 6 tablet; Refill: 0  Uvulitis -  Amoxicillin-Pot Clavulanate; Take 1 tablet by mouth 2 (two) times daily with a meal for 7 days.  Dispense: 14 tablet; Refill: 0 -     Azithromycin; Take 2 tablets on day 1, then 1 tablet daily on days 2 through 5  Dispense: 6 tablet; Refill: 0  Lower respiratory tract infection -     Amoxicillin-Pot Clavulanate; Take 1 tablet by mouth 2 (two) times daily with a meal for 7 days.  Dispense: 14 tablet; Refill: 0 -     Azithromycin; Take 2 tablets on day 1, then 1 tablet daily on days 2 through 5  Dispense: 6 tablet; Refill: 0  Assessment & Plan Otitis Media and Pharyngitis Bilateral ear pain and sore throat for 10 days with postnasal drainage and productive cough. No fever or respiratory distress. History of chronic sinusitis with multiple surgeries, most recent in 2021. Ears are red and left is bulging. Throat is swollen bilaterally. Suspected mycoplasma pneumonia based on symptoms and recent CDC guidelines. -Start combination therapy with Azithromycin and Augmentin. -Continue Flonase nasal spray to help open eustachian tubes. -Consider over-the-counter Quercetin for immune support. -OTC saline sinus rinse  Chronic Sinusitis History of chronic sinusitis with multiple surgeries. Current symptoms are different from typical sinusitis presentation. -No changes to current management plan.   No follow-ups on file.   Maretta Bees, PA

## 2023-12-21 NOTE — Patient Instructions (Addendum)
Your symptoms are concerning for developing mycoplasma pneumonia infection.  Please take Augmentin twice daily with food. This is best taken with breakfast and dinner. Consider eating yogurt daily to help prevent any adverse GI symptoms such as loose stools.  Please take Azithromycin concurrently, per package directions.  Continue daily use of your flonase nasal spray. You may also want to consider saline nasal flushes.  Start taking 500mg  Quercetin and 50mg  Zinc - this can help boost your immune system to get well faster.  Drink plenty of water. Rest. Cover your cough and wash your hands frequently.  Please notify us if any of your symptoms persist >7 days, or if you develop shortness of breath, chest pains or fever.

## 2023-12-24 ENCOUNTER — Encounter: Payer: Self-pay | Admitting: Pulmonary Disease

## 2023-12-24 ENCOUNTER — Ambulatory Visit: Payer: BC Managed Care – PPO | Admitting: Pulmonary Disease

## 2023-12-24 VITALS — BP 117/66 | HR 79 | Ht 68.0 in | Wt 246.0 lb

## 2023-12-24 DIAGNOSIS — E669 Obesity, unspecified: Secondary | ICD-10-CM | POA: Diagnosis not present

## 2023-12-24 DIAGNOSIS — G4733 Obstructive sleep apnea (adult) (pediatric): Secondary | ICD-10-CM | POA: Diagnosis not present

## 2023-12-24 NOTE — Progress Notes (Signed)
Scott Phillips    [id number]    12-22-1971  Primary Care Physician:Paz, Nolon Rod, MD  Referring Physician: Wanda Plump, MD [address] [place],  [place] [address]  Chief complaint:   Patient with a history of obstructive sleep apnea  HPI: Continues to use CPAP on a regular basis Was last seen about a year ago  Recently treated for pneumonia, completing course of antibiotics  Continues to use CPAP on a regular basis Continues to benefit from CPAP use  Wakes up feeling like he is at a good nights rest  Diagnosed with obstructive sleep apnea over 10 years ago Has been using CPAP on a regular basis  Bedtime is usually between 11 and 1230 Falls asleep quickly About 1 awakening Final wake up time about 715  Weight fluctuates  Not having any significant daytime symptoms  He stated his old study just met the threshold for treatment  Outpatient Encounter Medications as of 12/24/2023  Medication Sig   amoxicillin-clavulanate (AUGMENTIN) 875-125 MG tablet Take 1 tablet by mouth 2 (two) times daily with a meal for 7 days.   azithromycin (ZITHROMAX) 250 MG tablet Take 2 tablets on day 1, then 1 tablet daily on days 2 through 5   b complex vitamins tablet Take 1 tablet by mouth 3 (three) times a week.   Calcium-Magnesium-Zinc (CALCIUM-MAGNESUIUM-ZINC PO) Take by mouth 2 (two) times daily.   Carbinoxamine Maleate 4 MG TABS Take 1 tablet (4 mg total) by mouth every 6 (six) hours as needed.   famotidine (PEPCID) 20 MG tablet Take 1 tablet (20 mg total) by mouth 2 (two) times daily before a meal.   melatonin 5 MG TABS Take 5 mg by mouth at bedtime.   METAMUCIL FIBER PO Take by mouth.   Misc Natural Products (TURMERIC CURCUMIN) CAPS Take 1,000 each by mouth.   Multiple Vitamin (MULTIVITAMIN ADULT PO) Take by mouth.   pantoprazole (PROTONIX) 40 MG tablet Take 1 tablet (40 mg total) by mouth 2 (two) times daily before a meal.   Probiotic Product (PROBIOTIC-10  PO) Take by mouth.   No facility-administered encounter medications on file as of 12/24/2023.    Allergies as of 12/24/2023 - Review Complete 12/24/2023  Allergen Reaction Noted   Milk-related compounds Other (See Comments) 06/29/2020    Past Medical History:  Diagnosis Date   Allergic rhinosinusitis    Allergy    Arthritis    Colon polyps    Depression    Diverticulitis    GERD (gastroesophageal reflux disease)    Sinusitis    Sleep apnea     Past Surgical History:  Procedure Laterality Date   CARPAL TUNNEL RELEASE     KNEE ARTHROSCOPY Right 1991   cyst removal   MAXILLARY ANTROSTOMY Right 06/29/2020   Procedure: RIGHT MAXILLARY ANTROSTOMY WITH TISSUE REMOVAL;  Surgeon: Newman Pies, MD;  Location:  SUR[facility]ERY [facility];  Service: ENT;  Laterality: Right;   NASAL SINUS SURGERY     x3 in [place]   SUBACROMIAL DECOMPRESSION Left    TRIGGER FINGER RELEASE Right 2018   R 3rd   VASECTOMY  2019    Family History  Problem Relation Age of Onset   Cancer Mother    Depression Mother    Hypertension Mother    Miscarriages / [place] Mother    Cancer Father    Alcohol abuse Father    Arthritis Father  Diabetes Father    Hearing loss Father    Allergic rhinitis Sister    Depression Maternal Grandmother    Hearing loss Maternal Grandfather    Asthma Maternal Grandfather    Diabetes Paternal Grandfather    Colon cancer Neg Hx    Prostate cancer Neg Hx    CAD Neg Hx     Social History   Socioeconomic History   Marital status: Married    Spouse name: Not on file   Number of children: 1   Years of education: Not on file   Highest education level: Bachelor's degree (e.g., BA, AB, BS)  Occupational History   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  Tobacco Use   Smoking status: Former    Current packs/day: 0.00    Average packs/day: 2.0 packs/day for 23.0 years (46.0 ttl pk-yrs)    Types: Cigarettes    Start date: 12/30/1991    Quit date: 12/29/2014     Years since quitting: 8.9   Smokeless tobacco: Never  Vaping Use   Vaping status: Never Used  Substance and Sexual Activity   Alcohol use: Yes    Comment: occas   Drug use: Never   Sexual activity: Not on file  Other Topics Concern   Not on file  Social History Narrative   Household: pt, wife and Daughter born  2018   Social Drivers of Health   Financial Resource Strain: Low Risk  (12/21/2023)   Overall Financial Resource Strain (CARDIA)    Difficulty of Paying Living Expenses: Not hard at all  Food Insecurity: No Food Insecurity (12/21/2023)   Hunger Vital Sign    Worried About Running Out of Food in the Last Year: Never true    Ran Out of Food in the Last Year: Never true  Transportation Needs: No Transportation Needs (12/21/2023)   PRAPARE - Administrator, Civil Service (Medical): No    Lack of Transportation (Non-Medical): No  Physical Activity: Inactive (12/21/2023)   Exercise Vital Sign    Days of Exercise per Week: 0 days    Minutes of Exercise per Session: 10 min  Stress: Stress Concern Present (12/21/2023)   [employer] of Occupational Health - Occupational Stress Questionnaire    Feeling of Stress : To some extent  Social Connections: Moderately Integrated (12/21/2023)   Social Connection and Isolation Panel [NHANES]    Frequency of Communication with Friends and Family: Three times a week    Frequency of Social Gatherings with Friends and Family: Twice a week    Attends Religious Services: More than 4 times per year    Active Member of [employer] or Organizations: No    Attends Engineer, structural: Not on file    Marital Status: Married  Intimate Partner Violence: Unknown (04/20/2020)   Received from [facility], [facility]   Intimate Partner Violence    Fear of Current or Ex-Partner: Not asked    Emotionally Abused: Not asked    Physically Abused: Not asked    Sexually Abused: Not asked    Review of Systems  Respiratory:   Positive for apnea.   Psychiatric/Behavioral:  Positive for sleep disturbance.   All other systems reviewed and are negative.   Vitals:   12/24/23 1134  BP: 117/66  Pulse: 79  SpO2: 97%     Physical Exam Constitutional:      Appearance: He is obese.  HENT:     Mouth/Throat:     Mouth:  Mucous membranes are moist.     Comments: Crowded oropharynx Eyes:     General:        Right eye: No discharge.        Left eye: No discharge.  Neck:     Comments: 17-1/2 neck size  Cardiovascular:     Rate and Rhythm: Normal rate and regular rhythm.     Heart sounds:     No friction rub.  Pulmonary:     Effort: Pulmonary effort is normal. No respiratory distress.     Breath sounds: No stridor. No wheezing or rhonchi.  Musculoskeletal:     Cervical back: No rigidity or tenderness.  Neurological:     Mental Status: He is alert.  Psychiatric:        Mood and Affect: Mood normal.       11/27/2020   11:00 AM  Results of the Epworth flowsheet  Sitting and reading 1  Watching TV 1  Sitting, inactive in a public place (e.g. a theatre or a meeting) 0  As a passenger in a car for an hour without a break 1  Lying down to rest in the afternoon when circumstances permit 3  Sitting and talking to someone 0  Sitting quietly after a lunch without alcohol 0  In a car, while stopped for a few minutes in traffic 0  Total score 6    Data Reviewed: [place]  Compliance data reviewed showing 100% compliance AutoSet 5-16 Residual AHI of 0.8  His compliance last year was also excellent with a residual AHI of 0.1   Assessment:  Obstructive sleep apnea -Remains 100% compliant  Continues to benefit from CPAP use  Recent pneumonia -Completing course of antibiotics  Obesity -Encouraged about weight loss efforts -Diet and exercise as tolerated   Plan/Recommendations:  Continue CPAP nightly he is on auto CPAP 6-15   Will continue yearly  follow-up  Encouraged to call with significant concerns  Virl Diamond MD [facility] 12/24/2023, 11:44 AM  CC: Wanda Plump, MD

## 2023-12-24 NOTE — Patient Instructions (Signed)
Continue using your CPAP nightly  The download from the machine shows it is working well in controlling events well  Hope you continue to improve with your pneumonia treatment  Call us with significant concerns  Follow-up a year from now

## 2023-12-28 ENCOUNTER — Ambulatory Visit: Payer: BC Managed Care – PPO | Admitting: Internal Medicine

## 2023-12-28 ENCOUNTER — Encounter: Payer: Self-pay | Admitting: Internal Medicine

## 2023-12-28 VITALS — BP 126/78 | HR 63 | Temp 98.0°F | Resp 18 | Ht 68.0 in | Wt 244.2 lb

## 2023-12-28 DIAGNOSIS — H6690 Otitis media, unspecified, unspecified ear: Secondary | ICD-10-CM | POA: Diagnosis not present

## 2023-12-28 MED ORDER — PREDNISONE 10 MG PO TABS: ORAL_TABLET | ORAL | 0 refills | Status: DC

## 2023-12-28 NOTE — Progress Notes (Signed)
Subjective:    Patient ID: Scott Phillips, male    DOB: [birth year], 52 y.o.   MRN: [id number]  DOS:  12/28/2023 Type of visit - description: f/u  Symptoms started approximately December 10:  sore throat, ear congestion.  + Cough, some thick yellowish sputum noted. No fever. Was seen at another office December 23, prescribed Zithromax and Augmentin. He had otitis media and they suspected possible walking pneumonia.  Here for follow-up. Again no fever or chills. Ears congestion improving but not completely well. No shortness of breath Still feels very fatigued.  Review of Systems See above   Past Medical History:  Diagnosis Date   Allergic rhinosinusitis    Allergy    Arthritis    Colon polyps    Depression    Diverticulitis    GERD (gastroesophageal reflux disease)    Sinusitis    Sleep apnea     Past Surgical History:  Procedure Laterality Date   CARPAL TUNNEL RELEASE     KNEE ARTHROSCOPY Right 1991   cyst removal   MAXILLARY ANTROSTOMY Right 06/29/2020   Procedure: RIGHT MAXILLARY ANTROSTOMY WITH TISSUE REMOVAL;  Surgeon: Newman Pies, MD;  Location: [facility];  Service: ENT;  Laterality: Right;   NASAL SINUS SURGERY     x3 in [place]   SUBACROMIAL DECOMPRESSION Left    TRIGGER FINGER RELEASE Right 2018   R 3rd   VASECTOMY  2019    Current Outpatient Medications  Medication Instructions   amoxicillin-clavulanate (AUGMENTIN) 875-125 MG tablet 1 tablet, Oral, 2 times daily with meals   b complex vitamins tablet 1 tablet, 3 times weekly   Calcium-Magnesium-Zinc (CALCIUM-MAGNESUIUM-ZINC PO) 2 times daily   Carbinoxamine Maleate 4 mg, Oral, Every 6 hours PRN   famotidine (PEPCID) 20 mg, Oral, 2 times daily before meals   melatonin 5 mg, Daily at bedtime   METAMUCIL FIBER PO Take by mouth.   Misc Natural Products (TURMERIC CURCUMIN) CAPS 1,000 each   Multiple Vitamin (MULTIVITAMIN ADULT PO) Take by mouth.   pantoprazole (PROTONIX) 40 mg,  Oral, 2 times daily before meals   Probiotic Product (PROBIOTIC-10 PO) Take by mouth.       Objective:   Physical Exam BP 126/78   Pulse 63   Temp 98 F (36.7 C) (Oral)   Resp 18   Ht 5\' 8"  (1.727 m)   Wt 244 lb 4 oz (110.8 kg)   SpO2 96%   BMI 37.14 kg/m  General:   Well developed, NAD, BMI noted. HEENT:  Normocephalic . Face symmetric, atraumatic. TMs: They are both bulge, red, worse on the right.  No openings, no discharge.  Canals normal. Throat: Symmetric, uvula not enlarged, no white patches. Lungs:  CTA B Normal respiratory effort, no intercostal retractions, no accessory muscle use. Heart: RRR,  no murmur.  Lower extremities: no pretibial edema bilaterally  Skin: Not pale. Not jaundice Neurologic:  alert & oriented X3.  Speech normal, gait appropriate for age and unassisted Psych--  Cognition and judgment appear intact.  Cooperative with normal attention span and concentration.  Behavior appropriate. No anxious or depressed appearing.      Assessment      ASSESSMENT New patient 06-2020, moved from [place]. OSA , dx ~2010, +CPAP GERD: had EGD in [place] per pt remotely (2006?) Colon Polyps H/o Diverticulitis  H/o Depression Allergies  Sinus issues: Surgery 2012, surgery 06/2020   PLAN Otitis media: Seen with respiratory symptoms December 23, otitis was diagnosed,  they also suspected pneumonia. Finished Zithromax, still has Augmentin.  Overall improving.  Not sure if he had pneumonia. Plan: Finish antibiotics, treat otitis media aggressively w/ a round of prednisone, Flonase, Astepro.  Call if not gradually better. Chronic rhinitis: Saw ENT 11/18/2023. OSA: Saw pulmonary 12/24/2023.  100% compliant Mild dyslipidemia: See visit from October 2024, LDL increased, plan is to recheck his FLP in few months.

## 2023-12-28 NOTE — Assessment & Plan Note (Signed)
Otitis media: Seen with respiratory symptoms December 23, otitis was diagnosed, they also suspected pneumonia. Finished Zithromax, still has Augmentin.  Overall improving.  Not sure if he had pneumonia. Plan: Finish antibiotics, treat otitis media aggressively w/ a round of prednisone, Flonase, Astepro.  Call if not gradually better. Chronic rhinitis: Saw ENT 11/18/2023. OSA: Saw pulmonary 12/24/2023.  100% compliant Mild dyslipidemia: See visit from October 2024, LDL increased, plan is to recheck his FLP in few months.

## 2023-12-28 NOTE — Patient Instructions (Signed)
Take prednisone for few days   For nasal congestion: -Use over-the-counter Flonase: 2 nasal sprays on each side of the nose in the morning until you feel better  -Use OTC Astepro 2 nasal sprays on each side of the nose twice daily until better     Call if not gradually better

## 2023-12-31 ENCOUNTER — Encounter: Payer: Self-pay | Admitting: Internal Medicine

## 2023-12-31 MED ORDER — PANTOPRAZOLE SODIUM 40 MG PO TBEC: 40.0000 mg | DELAYED_RELEASE_TABLET | Freq: Two times a day (BID) | ORAL | 1 refills | Status: DC

## 2024-01-05 ENCOUNTER — Ambulatory Visit: Payer: BC Managed Care – PPO | Admitting: Internal Medicine

## 2024-01-05 ENCOUNTER — Encounter: Payer: Self-pay | Admitting: Internal Medicine

## 2024-01-05 VITALS — BP 116/76 | HR 70 | Temp 98.1°F | Resp 16 | Ht 68.0 in | Wt 245.5 lb

## 2024-01-05 DIAGNOSIS — J029 Acute pharyngitis, unspecified: Secondary | ICD-10-CM | POA: Diagnosis not present

## 2024-01-05 DIAGNOSIS — H6690 Otitis media, unspecified, unspecified ear: Secondary | ICD-10-CM

## 2024-01-05 LAB — POCT RAPID STREP A (OFFICE): Rapid Strep A Screen: NEGATIVE

## 2024-01-05 NOTE — Progress Notes (Signed)
 Subjective:    Patient ID: Scott Phillips, male    DOB: [birth year], 53 y.o.   MRN: [id number]  DOS:  01/05/2024 Type of visit - description: Acute  Was seen 12/28/2023, was recovering from upper respiratory infection, was recommended prednisone , Flonase, Astepro . His main concern was ear pain and discomfort.  Otalgia is better however in the last few days he has developed some sore throat. No fever chills No runny nose. No cough.  Had some postnasal dripping, better now with saline irrigations. No major allergies.   Review of Systems See above   Past Medical History:  Diagnosis Date   Allergic rhinosinusitis    Allergy    Arthritis    Colon polyps    Depression    Diverticulitis    GERD (gastroesophageal reflux disease)    Sinusitis    Sleep apnea     Past Surgical History:  Procedure Laterality Date   CARPAL TUNNEL RELEASE     KNEE ARTHROSCOPY Right 1991   cyst removal   MAXILLARY ANTROSTOMY Right 06/29/2020   Procedure: RIGHT MAXILLARY ANTROSTOMY WITH TISSUE REMOVAL;  Surgeon: Karis Clunes, MD;  Location: [facility];  Service: ENT;  Laterality: Right;   NASAL SINUS SURGERY     x3 in [place]   SUBACROMIAL DECOMPRESSION Left    TRIGGER FINGER RELEASE Right 2018   R 3rd   VASECTOMY  2019    Current Outpatient Medications  Medication Instructions   b complex vitamins tablet 1 tablet, 3 times weekly   Calcium-Magnesium-Zinc  (CALCIUM-MAGNESUIUM-ZINC  PO) 2 times daily   Carbinoxamine  Maleate 4 mg, Oral, Every 6 hours PRN   famotidine  (PEPCID ) 20 mg, Oral, 2 times daily before meals   melatonin 5 mg, Daily at bedtime   METAMUCIL FIBER PO Take by mouth.   Misc Natural Products (TURMERIC CURCUMIN) CAPS 1,000 each   Multiple Vitamin (MULTIVITAMIN ADULT PO) Take by mouth.   pantoprazole  (PROTONIX ) 40 mg, Oral, 2 times daily before meals   predniSONE  (DELTASONE ) 10 MG tablet 3 tabs x 3 days, 2 tabs x 3 days, 1 tab x 3 days   Probiotic Product  (PROBIOTIC-10 PO) Take by mouth.       Objective:   Physical Exam BP 116/76   Pulse 70   Temp 98.1 F (36.7 C) (Oral)   Resp 16   Ht 5' 8 (1.727 m)   Wt 245 lb 8 oz (111.4 kg)   SpO2 97%   BMI 37.33 kg/m  General:   Well developed, NAD, BMI noted. HEENT:  Normocephalic . Face symmetric, atraumatic. Both TMs are bulge but not red, improved compared to last visit. Throat: Minimal erythema, no white patches, some cobblestone changes. Lungs:  CTA B Normal respiratory effort, no intercostal retractions, no accessory muscle use. Heart: RRR,  no murmur.  Lower extremities: no pretibial edema bilaterally  Skin: Not pale. Not jaundice Neurologic:  alert & oriented X3.  Speech normal, gait appropriate for age and unassisted Psych--  Cognition and judgment appear intact.  Cooperative with normal attention span and concentration.  Behavior appropriate. No anxious or depressed appearing.      Assessment    Problem list: New patient 06-2020, moved from [place] . OSA , dx ~2010, +CPAP GERD: had EGD in [place] per pt remotely (2006?) Colon Polyps H/o Diverticulitis  H/o Depression Allergies  Sinus issues: Surgery 2012, surgery 06/2020   PLAN Otitis media: See LOV, improving, TMs still slightly bulged.  Continue with Astelin , Flonase. Sore  throat: As described above, exam is benign, doubt he has an active infection, strep test today: Negative. Recommend to hold carbinoxamine  and try Allegra 180 mg Mild dyslipidemia: Will come back in February for FLP.  Orders already in the computer. Otherwise RTC CPX October 2024 (scheduled)

## 2024-01-05 NOTE — Patient Instructions (Signed)
 Continue Astelin, Flonase, saline irrigations of the nose.  Start Allegra 180 mg OTC: 1 tablet daily for 10 days, then as needed.

## 2024-01-05 NOTE — Assessment & Plan Note (Signed)
 Otitis media: See LOV, improving, TMs still slightly bulged.  Continue with Astelin , Flonase. Sore throat: As described above, exam is benign, doubt he has an active infection, strep test today: Negative. Recommend to hold carbinoxamine  and try Allegra 180 mg Mild dyslipidemia: Will come back in February for FLP.  Orders already in the computer. Otherwise RTC CPX October 2024 (scheduled)

## 2024-02-04 ENCOUNTER — Encounter: Payer: Self-pay | Admitting: Internal Medicine

## 2024-02-04 DIAGNOSIS — M79604 Pain in right leg: Secondary | ICD-10-CM

## 2024-02-05 NOTE — Progress Notes (Signed)
 I, Miquel Amen, CMA acting as a scribe for Garlan Juniper, MD.  Scott Phillips is a 53 y.o. male who presents to [facility] at [facility] today for R leg pain ongoing for 4-5 months. Pt locates pain to lateral aspect of the knee, denies injury. Also having some peripatellar pain. The knee will give out at times. Grinding and popping present. Denies swelling. Hx of knee injury years ago. He helps more than ice. Was having tightness in the hamstring prior to knee pain starting. Sx causing night disturbance. Sx progressively worsening.   Swelling: no Aggravates: ambulation Treatments tried: bracing, stretching, IBU, Tylenol , heat soaking, ice  Dx imaging: 02/28/21 R knee XR  Pertinent review of systems: No fevers or chills  Relevant historical information: History of patellofemoral pain right knee 2022   Exam:  BP 122/82   Pulse 76   Ht 5\' 8"  (1.727 m)   Wt 248 lb (112.5 kg)   SpO2 98%   BMI 37.71 kg/m  General: Well Developed, well nourished, and in no acute distress.   MSK: Right knee large joint effusion.  Normal-appearing otherwise.  Normal motion with crepitation.  Tender palpation anterior knee. Stable ligamentous exam. Intact strength pain with resisted extension.    Lab and Radiology Results  Procedure: Real-time Ultrasound Guided Injection of right knee joint superior lateral patella space Device: Philips Affiniti 50G/GE Logiq Images permanently stored and available for review in PACS Ultrasound evaluation prior to injection reveals a moderate joint effusion.  Relatively normal medial and lateral joint lines.  No Baker's cyst. Verbal informed consent obtained.  Discussed risks and benefits of procedure. Warned about infection, bleeding, hyperglycemia damage to structures among others. Patient expresses understanding and agreement Time-out conducted.   Noted no overlying erythema, induration, or other signs of local infection.   Skin prepped in a  sterile fashion.   Local anesthesia: Topical Ethyl chloride.   With sterile technique and under real time ultrasound guidance: 40 mg of Kenalog and 2 mL of Marcaine injected into knee joint. Fluid seen entering the joint capsule.   Completed without difficulty   Pain immediately resolved suggesting accurate placement of the medication.   Advised to call if fevers/chills, erythema, induration, drainage, or persistent bleeding.   Images permanently stored and available for review in the ultrasound unit.  Impression: Technically successful ultrasound guided injection.   X-ray images right knee obtained today personally and independently interpreted. Mild medial and patellofemoral DJD.  No acute fractures are visible. Await formal radiology review     Assessment and Plan: 53 y.o. male with acute exacerbation of chronic right knee pain.  He does have a joint effusion on ultrasound which indicates he has more degeneration in his knee than I see on x-ray.  Plan for intra-articular steroid injection and physical therapy.  Recommend compression sleeve and Voltaren gel.  Recheck 6-8 weeks.   PDMP not reviewed this encounter. Orders Placed This Encounter  Procedures   US  LIMITED JOINT SPACE STRUCTURES LOW RIGHT(NO LINKED CHARGES)    Reason for Exam (SYMPTOM  OR DIAGNOSIS REQUIRED):   right knee pain    Preferred imaging location?:   [facility] Knee AP/LAT W/[place] Right    Standing Status:   Future    Number of Occurrences:   1    Expiration Date:   03/07/2024    Reason for Exam (SYMPTOM  OR DIAGNOSIS REQUIRED):   right knee pain    Preferred imaging  location?:   [facility]   Ambulatory referral to Physical Therapy    Referral Priority:   Routine    Referral Type:   Physical Medicine    Referral Reason:   Specialty Services Required    Requested Specialty:   Physical Therapy    Number of Visits Requested:   1   No orders of the defined types were  placed in this encounter.    Discussed warning signs or symptoms. Please see discharge instructions. Patient expresses understanding.   The above documentation has been reviewed and is accurate and complete Garlan Juniper, M.D.

## 2024-02-08 ENCOUNTER — Ambulatory Visit (INDEPENDENT_AMBULATORY_CARE_PROVIDER_SITE_OTHER): Payer: BC Managed Care – PPO

## 2024-02-08 ENCOUNTER — Ambulatory Visit: Payer: BC Managed Care – PPO | Admitting: Family Medicine

## 2024-02-08 ENCOUNTER — Encounter: Payer: Self-pay | Admitting: Family Medicine

## 2024-02-08 ENCOUNTER — Other Ambulatory Visit: Payer: Self-pay

## 2024-02-08 VITALS — BP 122/82 | HR 76 | Ht 68.0 in | Wt 248.0 lb

## 2024-02-08 DIAGNOSIS — M25461 Effusion, right knee: Secondary | ICD-10-CM | POA: Diagnosis not present

## 2024-02-08 DIAGNOSIS — G8929 Other chronic pain: Secondary | ICD-10-CM

## 2024-02-08 DIAGNOSIS — M25561 Pain in right knee: Secondary | ICD-10-CM

## 2024-02-08 DIAGNOSIS — M1711 Unilateral primary osteoarthritis, right knee: Secondary | ICD-10-CM | POA: Diagnosis not present

## 2024-02-08 NOTE — Patient Instructions (Addendum)
 Thank you for coming in today.  Please get an Xray today before you leave  Please use Voltaren gel (Generic Diclofenac Gel) up to 4x daily for pain as needed.  This is available over-the-counter as both the name brand Voltaren gel and the generic diclofenac gel.

## 2024-02-09 ENCOUNTER — Encounter: Payer: Self-pay | Admitting: Family Medicine

## 2024-02-10 ENCOUNTER — Ambulatory Visit: Payer: BC Managed Care – PPO | Attending: Family Medicine

## 2024-02-10 ENCOUNTER — Other Ambulatory Visit: Payer: Self-pay

## 2024-02-10 DIAGNOSIS — R29898 Other symptoms and signs involving the musculoskeletal system: Secondary | ICD-10-CM | POA: Insufficient documentation

## 2024-02-10 DIAGNOSIS — G8929 Other chronic pain: Secondary | ICD-10-CM | POA: Insufficient documentation

## 2024-02-10 DIAGNOSIS — R262 Difficulty in walking, not elsewhere classified: Secondary | ICD-10-CM | POA: Diagnosis not present

## 2024-02-10 DIAGNOSIS — M25561 Pain in right knee: Secondary | ICD-10-CM | POA: Insufficient documentation

## 2024-02-10 DIAGNOSIS — S76311A Strain of muscle, fascia and tendon of the posterior muscle group at thigh level, right thigh, initial encounter: Secondary | ICD-10-CM | POA: Diagnosis not present

## 2024-02-10 NOTE — Therapy (Signed)
OUTPATIENT PHYSICAL THERAPY LOWER EXTREMITY EVALUATION   Patient Name: Scott Phillips MRN: [id number] DOB:1971/03/19, 53 y.o., male Today's Date: 02/10/2024  END OF SESSION:  PT End of Session - 02/10/24 1744     Visit Number 1    Date for PT Re-Evaluation 04/06/24    Progress Note Due on Visit 10    PT Start Time 1600    PT Stop Time 1650    PT Time Calculation (min) 50 min    Activity Tolerance Patient tolerated treatment well    Behavior During Therapy WFL for tasks assessed/performed             Past Medical History:  Diagnosis Date   Allergic rhinosinusitis    Allergy    Arthritis    Colon polyps    Depression    Diverticulitis    GERD (gastroesophageal reflux disease)    Sinusitis    Sleep apnea    Past Surgical History:  Procedure Laterality Date   CARPAL TUNNEL RELEASE     KNEE ARTHROSCOPY Right 1991   cyst removal   MAXILLARY ANTROSTOMY Right 06/29/2020   Procedure: RIGHT MAXILLARY ANTROSTOMY WITH TISSUE REMOVAL;  Surgeon: Newman Pies, MD;  Location: [facility];  Service: ENT;  Laterality: Right;   NASAL SINUS SURGERY     x3 in [place]   SUBACROMIAL DECOMPRESSION Left    TRIGGER FINGER RELEASE Right 2018   R 3rd   VASECTOMY  2019   Patient Active Problem List   Diagnosis Date Noted   Hypertrophy of nasal turbinates 11/18/2023   Allergic conjunctivitis of both eyes 04/23/2022   Patellofemoral pain syndrome of right knee 01/16/2021   Perennial allergic rhinitis 11/15/2020   Chronic maxillary sinusitis 11/15/2020   Allergic conjunctivitis 11/15/2020   Annual physical exam 10/14/2020   Serrated polyp of colon 07/13/2020   PCP notes >>>>>>>>>> 07/13/2020   GERD 06/26/2020   OSA (obstructive sleep apnea) 06/26/2020   HPV (human papilloma virus) anogenital infection 04/02/2001    PCP: Willow Ora, MD  REFERRING PROVIDER: Rodolph Bong MD  REFERRING DIAG: chronic R knee pain  THERAPY DIAG:  Chronic pain of right  knee  Weakness of right hip  Hamstring strain, right, initial encounter  Difficulty in walking, not elsewhere classified  Rationale for Evaluation and Treatment: Rehabilitation  ONSET DATE: September 2024  SUBJECTIVE:   SUBJECTIVE STATEMENT: I have had chronic knee pain, have had some bone chips removed around my R lat knee jt after I injured both knees in high school. I usually manage by using a rower at home, weight lifting, and I stretch a lot.  Have not consistently worked out in about a year.  Hamstrings and side of my knee are really tight.   PERTINENT HISTORY: Progressive knee pain. Was referred by sports medicine MD, had ultrasound guided injection R knee this week which reduced his pain quite a bit  PAIN:  Are you having pain? Yes: NPRS scale: 2 to 9 Pain location: R lateral knee at attachments of IT band, distal and lat quads Pain description: deep aching, throbbing  Aggravating factors: sleeping , walking, weight bearing Relieving factors: the shot helped, wear an external knee support  PRECAUTIONS: None  RED FLAGS: None   WEIGHT BEARING RESTRICTIONS: No  FALLS:  Has patient fallen in last 6 months? 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  DIAGNOSTIC FINDINGS: X-ray images right knee obtained today personally and independently interpreted. Mild medial and patellofemoral DJD.  No acute fractures are visible. Await formal radiology review  PATIENT SURVEYS:  Tegner Lysholm Knee Score: 43 / 100 = 43.0 %  COGNITION: Overall cognitive status:  Within functional limits for tasks assessed     SENSATION: WFL  EDEMA:  Not measured but mild edema R post knee, gastroc laterally  MUSCLE LENGTH: Hamstrings: Right -40 deg; Left -20 deg Prone knee bend: Right 90 deg; Left wnl, greater than 110 deg  POSTURE: No Significant postural limitations  PALPATION: Point tender with thickened tendons at proximal and distal lateral hamstring attachments, also proximal hamstrings muscle belly centrally, lateral knee over IT band, also distal quads, vastus lateralis  LOWER EXTREMITY ROM:ROM R hip reduced by 15 degrees all planes as compared to L B ankles wnl R knee ext 0, flexion 105  LOWER EXTREMITY MMT:  MMT Right eval Left eval  Hip flexion    Hip extension 4 5  Hip abduction 4-   Hip adduction    Hip internal rotation    Hip external rotation 4-   Knee flexion 3+   Knee extension    Ankle dorsiflexion    Ankle plantarflexion    Ankle inversion    Ankle eversion     (Blank rows = not tested)    FUNCTIONAL TESTS:  5 times sit to stand: TBD, too inflamed to test at eval  GAIT: Distance walked: in clinic 80', initially with supported knee brace, then with compression sleeve at end of session. No antalgia noted.                                                                                                                                  TREATMENT DATE: 02/10/24  Evaluation, Utilized massage gun for focused deep cross friction massage R proximal hamstrings tendon, and R gluteus medius Kinesiotaping , rams horn R lat IT band Instructed in the following: Eccentric hamstring stretch, long arc quad providing hamstring contraction while resisting with Ue's with strap Seated isometric hamstring sets with strap, advised 10 sec holds 6 reps, several times a day Seated isometric hip abd/ER 10 sec holds with strap, 6 reps several x a day  PATIENT EDUCATION:  Education details: POC, goals Person educated: Patient Education  method: Explanation, Demonstration, Tactile cues, Verbal cues, and Handouts Education comprehension: verbalized understanding, returned demonstration, verbal cues required, tactile cues required, and needs further education  HOME EXERCISE PROGRAM: See above  ASSESSMENT:  CLINICAL IMPRESSION: Patient is a 53 y.o. male who was evaluated today by physical therapy due to chronic  R knee pain.  He presents with  Tegner Lysholm Knee Score: 43 / 100 = 43.0 %deficit.  He has a diffuse group of muscles R lat hip and knee that are thickened and tender, especially R hamstrings.  Also weakness and lack of motor control R hamstrings, R lateral hip musculature.  Initiated manual techniques and isometrics, eccentrics to improve the high tissue irritability of these muscles today.  Will benefit from skilled PT and need guidance in pain management as well as effective progression of therex to improve his R knee pain and function.   OBJECTIVE IMPAIRMENTS: decreased activity tolerance, decreased endurance, decreased mobility, difficulty walking, decreased ROM, decreased strength, increased edema, increased fascial restrictions, and pain.   ACTIVITY LIMITATIONS: carrying, lifting, bending, standing, squatting, sleeping, stairs, transfers, and locomotion level  PARTICIPATION LIMITATIONS: meal prep, cleaning, laundry, driving, shopping, community activity, yard work, and school  PERSONAL FACTORS: Age, Fitness, Past/current experiences, Time since onset of injury/illness/exacerbation, and 1-2 comorbidities: past surgery R knee  are also affecting patient's functional outcome.   REHAB POTENTIAL: Good  CLINICAL DECISION MAKING: Evolving/moderate complexity  EVALUATION COMPLEXITY: Moderate   GOALS: Goals reviewed with patient? Yes  SHORT TERM GOALS: Target date: 2 weeks 02/14/24 I HEP  Baseline: Goal status: INITIAL   LONG TERM GOALS: Target date: 04/03/24  Tegner Lysolm improve from 43% function to  73% Baseline:  Goal status: INITIAL  2.  Improve R hamstring flexibility from -40 to -15 for improved gait efficiency Baseline:  Goal status: INITIAL  3.  Improve strength R hamstrings, hip extension, hip abd, and hip ER from 3+ to 4/5 to 5/5 for improved stability,control with gait , functional tasks Baseline:  Goal status: INITIAL  4.  5 x sit to stand 14 Baseline: TBD Goal status: INITIAL     PLAN:  PT FREQUENCY: 2x/week  PT DURATION: 8 weeks  PLANNED INTERVENTIONS: 97110-Therapeutic exercises, 97530- Therapeutic activity, 97112- Neuromuscular re-education, 97535- Self Care, and 16109- Manual therapy  PLAN FOR NEXT SESSION: how was therex for home, continue with soft tissue management, massage, theragun, ? TPDN, Ktaping, advance ex with additional eccentric demands in deficit areas   Kahdijah Errickson L Katsumi Wisler, PT, DPT, OCS 02/10/2024, 5:50 PM

## 2024-02-12 ENCOUNTER — Encounter: Payer: Self-pay | Admitting: Family Medicine

## 2024-02-12 ENCOUNTER — Other Ambulatory Visit: Payer: Self-pay

## 2024-02-12 DIAGNOSIS — G8929 Other chronic pain: Secondary | ICD-10-CM

## 2024-02-17 ENCOUNTER — Ambulatory Visit: Payer: BC Managed Care – PPO | Admitting: Physical Therapy

## 2024-02-17 DIAGNOSIS — M25551 Pain in right hip: Secondary | ICD-10-CM | POA: Diagnosis not present

## 2024-02-17 DIAGNOSIS — R531 Weakness: Secondary | ICD-10-CM | POA: Diagnosis not present

## 2024-02-17 DIAGNOSIS — M25561 Pain in right knee: Secondary | ICD-10-CM | POA: Diagnosis not present

## 2024-02-19 ENCOUNTER — Ambulatory Visit: Payer: BC Managed Care – PPO | Admitting: Physical Therapy

## 2024-02-19 ENCOUNTER — Encounter: Payer: Self-pay | Admitting: Family Medicine

## 2024-02-19 NOTE — Progress Notes (Signed)
Right knee x-ray shows a little bit of arthritis.

## 2024-02-22 ENCOUNTER — Ambulatory Visit: Payer: BC Managed Care – PPO

## 2024-02-24 ENCOUNTER — Ambulatory Visit: Payer: BC Managed Care – PPO | Admitting: Physical Therapy

## 2024-02-24 DIAGNOSIS — M25561 Pain in right knee: Secondary | ICD-10-CM | POA: Diagnosis not present

## 2024-02-24 DIAGNOSIS — R531 Weakness: Secondary | ICD-10-CM | POA: Diagnosis not present

## 2024-02-24 DIAGNOSIS — M25551 Pain in right hip: Secondary | ICD-10-CM | POA: Diagnosis not present

## 2024-02-26 DIAGNOSIS — M25551 Pain in right hip: Secondary | ICD-10-CM | POA: Diagnosis not present

## 2024-02-26 DIAGNOSIS — R531 Weakness: Secondary | ICD-10-CM | POA: Diagnosis not present

## 2024-02-26 DIAGNOSIS — M25561 Pain in right knee: Secondary | ICD-10-CM | POA: Diagnosis not present

## 2024-02-29 ENCOUNTER — Ambulatory Visit: Payer: BC Managed Care – PPO

## 2024-03-02 ENCOUNTER — Ambulatory Visit: Payer: BC Managed Care – PPO

## 2024-03-02 DIAGNOSIS — M25561 Pain in right knee: Secondary | ICD-10-CM | POA: Diagnosis not present

## 2024-03-02 DIAGNOSIS — M25551 Pain in right hip: Secondary | ICD-10-CM | POA: Diagnosis not present

## 2024-03-02 DIAGNOSIS — R531 Weakness: Secondary | ICD-10-CM | POA: Diagnosis not present

## 2024-03-04 DIAGNOSIS — R531 Weakness: Secondary | ICD-10-CM | POA: Diagnosis not present

## 2024-03-04 DIAGNOSIS — M25551 Pain in right hip: Secondary | ICD-10-CM | POA: Diagnosis not present

## 2024-03-04 DIAGNOSIS — M25561 Pain in right knee: Secondary | ICD-10-CM | POA: Diagnosis not present

## 2024-03-07 ENCOUNTER — Ambulatory Visit: Payer: BC Managed Care – PPO | Admitting: Physical Therapy

## 2024-03-09 ENCOUNTER — Ambulatory Visit: Payer: BC Managed Care – PPO

## 2024-03-14 DIAGNOSIS — R531 Weakness: Secondary | ICD-10-CM | POA: Diagnosis not present

## 2024-03-14 DIAGNOSIS — M25551 Pain in right hip: Secondary | ICD-10-CM | POA: Diagnosis not present

## 2024-03-14 DIAGNOSIS — M25561 Pain in right knee: Secondary | ICD-10-CM | POA: Diagnosis not present

## 2024-03-17 DIAGNOSIS — M79642 Pain in left hand: Secondary | ICD-10-CM | POA: Diagnosis not present

## 2024-03-17 DIAGNOSIS — M79641 Pain in right hand: Secondary | ICD-10-CM | POA: Diagnosis not present

## 2024-03-17 DIAGNOSIS — M65342 Trigger finger, left ring finger: Secondary | ICD-10-CM | POA: Diagnosis not present

## 2024-03-17 DIAGNOSIS — M65321 Trigger finger, right index finger: Secondary | ICD-10-CM | POA: Diagnosis not present

## 2024-03-18 ENCOUNTER — Other Ambulatory Visit: Payer: Self-pay | Admitting: Internal Medicine

## 2024-03-21 DIAGNOSIS — M25551 Pain in right hip: Secondary | ICD-10-CM | POA: Diagnosis not present

## 2024-03-21 DIAGNOSIS — R531 Weakness: Secondary | ICD-10-CM | POA: Diagnosis not present

## 2024-03-21 DIAGNOSIS — M25561 Pain in right knee: Secondary | ICD-10-CM | POA: Diagnosis not present

## 2024-03-22 NOTE — Progress Notes (Unsigned)
   Rubin Payor, PhD, LAT, ATC acting as a scribe for Clementeen Graham, MD.  Scott Phillips is a 53 y.o. male who presents to [facility] at [facility] today for f/u R knee pain. Pt was last seen by Dr. Denyse Amass on 02/08/24 and was given a R knee steroid injection and advised to use a compression sleeve and Voltaren gel. Pt was also referred to [facility] PT.  Today, pt reports ***  Dx imaging: 02/08/24 R knee XR 02/28/21 R knee XR   Pertinent review of systems: ***  Relevant historical information: ***   Exam:  There were no vitals taken for this visit. General: Well Developed, well nourished, and in no acute distress.   MSK: ***    Lab and Radiology Results No results found for this or any previous visit (from the past 72 hours). No results found.     Assessment and Plan: 53 y.o. male with ***   PDMP not reviewed this encounter. No orders of the defined types were placed in this encounter.  No orders of the defined types were placed in this encounter.    Discussed warning signs or symptoms. Please see discharge instructions. Patient expresses understanding.   ***

## 2024-03-23 ENCOUNTER — Other Ambulatory Visit: Payer: Self-pay

## 2024-03-23 ENCOUNTER — Encounter: Payer: Self-pay | Admitting: Family Medicine

## 2024-03-23 ENCOUNTER — Ambulatory Visit: Payer: BC Managed Care – PPO | Admitting: Family Medicine

## 2024-03-23 VITALS — BP 130/80 | HR 76 | Ht 68.0 in | Wt 268.0 lb

## 2024-03-23 DIAGNOSIS — G8929 Other chronic pain: Secondary | ICD-10-CM | POA: Diagnosis not present

## 2024-03-23 DIAGNOSIS — M25561 Pain in right knee: Secondary | ICD-10-CM

## 2024-03-23 MED ORDER — PREDNISONE 50 MG PO TABS: 50.0000 mg | ORAL_TABLET | Freq: Every day | ORAL | 0 refills | Status: DC

## 2024-03-23 NOTE — Patient Instructions (Addendum)
 Thank you for coming in today.   You should hear from MRI scheduling within 1 week. If you do not hear please let me know.    Check back after we get the MRI results.

## 2024-03-25 ENCOUNTER — Other Ambulatory Visit (INDEPENDENT_AMBULATORY_CARE_PROVIDER_SITE_OTHER): Payer: BC Managed Care – PPO

## 2024-03-25 ENCOUNTER — Encounter: Payer: Self-pay | Admitting: Family Medicine

## 2024-03-25 DIAGNOSIS — E785 Hyperlipidemia, unspecified: Secondary | ICD-10-CM

## 2024-03-25 LAB — LIPID PANEL
Cholesterol: 204 mg/dL — ABNORMAL HIGH (ref 0–200)
HDL: 44.1 mg/dL (ref >39.00)
LDL Cholesterol: 142 mg/dL — ABNORMAL HIGH (ref 0–99)
NonHDL: 159.59
Total CHOL/HDL Ratio: 5
Triglycerides: 87 mg/dL (ref 0.0–149.0)
VLDL: 17.4 mg/dL (ref 0.0–40.0)

## 2024-03-25 MED ORDER — LORAZEPAM 0.5 MG PO TABS: ORAL_TABLET | ORAL | 0 refills | Status: DC

## 2024-03-26 ENCOUNTER — Ambulatory Visit

## 2024-03-26 DIAGNOSIS — M25561 Pain in right knee: Secondary | ICD-10-CM | POA: Diagnosis not present

## 2024-03-26 DIAGNOSIS — R609 Edema, unspecified: Secondary | ICD-10-CM | POA: Diagnosis not present

## 2024-03-26 DIAGNOSIS — G8929 Other chronic pain: Secondary | ICD-10-CM

## 2024-03-26 DIAGNOSIS — M25461 Effusion, right knee: Secondary | ICD-10-CM | POA: Diagnosis not present

## 2024-03-28 ENCOUNTER — Encounter: Payer: Self-pay | Admitting: Internal Medicine

## 2024-03-30 ENCOUNTER — Encounter: Payer: Self-pay | Admitting: Family Medicine

## 2024-04-07 ENCOUNTER — Encounter: Payer: Self-pay | Admitting: Family Medicine

## 2024-04-07 NOTE — Progress Notes (Signed)
 I called and left you a message.  You have a fair amount of arthritis in the knee which is going to dictate what can be done later.  The main source of pain right now is probably that subchondral insufficiency fracture.  The treatment for that is to take the weight off of your knee using crutches initially.  Recommend you do that right away.  You can get crutches at a medical supply company like [employer] [facility] [employer] much cheaper than you can from my office.  Recommend that you schedule an appointment with me for the week after next to go over this in full detail and to see how you are doing.

## 2024-04-18 ENCOUNTER — Ambulatory Visit: Admitting: Family Medicine

## 2024-04-18 ENCOUNTER — Encounter: Payer: Self-pay | Admitting: Family Medicine

## 2024-04-18 VITALS — BP 136/86 | HR 86 | Ht 68.0 in

## 2024-04-18 DIAGNOSIS — G8929 Other chronic pain: Secondary | ICD-10-CM | POA: Diagnosis not present

## 2024-04-18 DIAGNOSIS — M1711 Unilateral primary osteoarthritis, right knee: Secondary | ICD-10-CM

## 2024-04-18 DIAGNOSIS — M25561 Pain in right knee: Secondary | ICD-10-CM

## 2024-04-18 DIAGNOSIS — S82141A Displaced bicondylar fracture of right tibia, initial encounter for closed fracture: Secondary | ICD-10-CM

## 2024-04-18 NOTE — Patient Instructions (Addendum)
 Thank you for coming in today.   You should hear about that knee brace soon.   Ok to use crutches less as you feel better.   Recheck in about 1 month.   More to do if needed.

## 2024-04-18 NOTE — Progress Notes (Signed)
 I, Miquel Amen, CMA acting as a scribe for Garlan Juniper, MD.  Scott Phillips is a 53 y.o. male who presents to [facility] at [facility] today for f/u R knee pain w/ MRI review. Pt was last seen by Dr. Alease Hunter on 03/23/24 and was prescribed prednisone  and a MRI was ordered.  Today, pt reports some improvement of sx since starting to ambulate with crutches. Continues to have pain, worse after more activity. Notes falling on the stairs with crutches this past Friday and injuring the right shoulder.  Feels okay now.  Dx imaging: 02/08/24 R knee XR 02/28/21 R knee XR  Pertinent review of systems: No fevers or chills  Relevant historical information: Sleep apnea.  Patient works as a Sport and exercise psychologist.   Exam:  BP 136/86   Pulse 86   Ht 5\' 8"  (1.727 m)   SpO2 98%   BMI 40.75 kg/m  General: Well Developed, well nourished, and in no acute distress.   MSK: Right knee normal-appearing Normal motion. Tender palpation medial joint line. Mild laxity MCL stress test. Intact strength.    Lab and Radiology Results  EXAM: MRI OF THE RIGHT KNEE WITHOUT CONTRAST   TECHNIQUE: Multiplanar, multisequence MR imaging of the knee was performed. No intravenous contrast was administered.   COMPARISON:  Right knee radiographs 02/08/2024   FINDINGS: MENISCI   Medial meniscus: There is intermediate proton density signal intrasubstance degeneration within the root of the posterior horn of the medial meniscus with mild degenerative fraying at the superior aspect (sagittal series 12, image 19 and coronal series 15, image 9).   Lateral meniscus:  Intact.   LIGAMENTS   Cruciates: The ACL and PCL are intact.   Collaterals: The medial collateral ligament is intact. The fibular collateral ligament, biceps femoris tendon, iliotibial band, and popliteus tendon are intact.   CARTILAGE   Patellofemoral: There is full-thickness cartilage loss throughout the inferior 12 mm  of the lateral patellar facet (sagittal image 13 and axial images tendon 11) with moderate chronic subchondral increased T2 signal edema/cystic change. There is also chronic subchondral edema/cystic change deep to the inferior 40% of the patellar apex in this region, deep to multiple falls full-thickness fissures within the patellar apex cartilage (axial images 7 through 10). Additional thin full-thickness fissures within the junction of the patellar apex and medial patellar facet cartilage (axial image 7).   Medial: High-grade thinning of the medial portion of the medial tibial plateau, with full-thickness cartilage loss of the anterior medial aspect where there is high-grade subchondral marrow edema surrounding horizontal linear decreased T1 and decreased T2 signal suggesting a subchondral insufficiency fracture measuring up to approximately 7 mm in transverse dimension and 11 mm in AP dimension (coronal images 12 through 17 and sagittal images 24 and 25). There is minimal overlying 1 mm cortical collapse within the far anteromedial aspect of the medial tibial plateau (coronal image 14). Moderate thinning of the medial aspect of the weight-bearing medial femoral condyle cartilage.   Lateral:  Intact.   Joint: Mild-to-moderate joint effusion. Mild focal edema within the superolateral aspect of Hoffa's fat pad as can be seen with infrapatellar fat pad impingement. The tibial tuberosity-trochlear groove distance measures 12 mm, within normal limits.   Popliteal Fossa:  No Baker's cyst.   Extensor Mechanism:  Intact quadriceps tendon and patellar tendon.   Bones: High-grade marrow edema within the anteromedial aspect of medial tibial plateau surrounding acute to subacute small subchondral insufficiency fracture, as above.  Other: There is mild to moderate edema and swelling within the subcutaneous fat anteromedial to the proximal tibia.   IMPRESSION: 1. Acute to subacute small  subchondral insufficiency fracture of the anteromedial aspect of the medial tibial plateau with high-grade surrounding marrow edema throughout the majority of the medial tibial plateau and minimal overlying 1 mm cortical collapse. 2. Intrasubstance degeneration within the root of the posterior horn of the medial meniscus with mild degenerative fraying at the superior aspect. 3. Moderate to severe inferolateral patellar cartilage degenerative changes. 4. Mild-to-moderate joint effusion. 5. Mild focal edema within the superolateral aspect of Hoffa's fat pad as can be seen with infrapatellar fat pad impingement.     Electronically Signed   By: Bertina Broccoli M.D.   On: 04/07/2024 13:11 I, Garlan Juniper, personally (independently) visualized and performed the interpretation of the images attached in this note.      Assessment and Plan: 53 y.o. male with chronic right knee pain due to DJD and a subchondral fracture.  Pain is already improving with reduced weightbearing and a previous steroid injection in February.  Plan for medial off loader knee brace.  This would help with the existing medial osteoarthritis and some of the instability seen on exam today.  Given his quad to calf ratio he may need a custom brace but I think will fit into an off-the-shelf 1.  Will contact [employer] to get that set up. Recheck in 1 month.   PDMP not reviewed this encounter. No orders of the defined types were placed in this encounter.  No orders of the defined types were placed in this encounter.    Discussed warning signs or symptoms. Please see discharge instructions. Patient expresses understanding.   The above documentation has been reviewed and is accurate and complete Garlan Juniper, M.D.

## 2024-04-26 ENCOUNTER — Encounter (INDEPENDENT_AMBULATORY_CARE_PROVIDER_SITE_OTHER): Payer: Self-pay

## 2024-04-26 ENCOUNTER — Encounter: Payer: Self-pay | Admitting: Family Medicine

## 2024-04-27 NOTE — Telephone Encounter (Signed)
 Forwarding to Dr. Denyse Amass to review and advise.

## 2024-04-29 DIAGNOSIS — M1711 Unilateral primary osteoarthritis, right knee: Secondary | ICD-10-CM | POA: Diagnosis not present

## 2024-04-29 NOTE — Progress Notes (Deleted)
   Joanna Muck, PhD, LAT, ATC acting as a scribe for Garlan Juniper, MD.  Scott Phillips is a 53 y.o. male who presents to [facility] at [facility] today for f/u bilat knee pain. R knee subchondral fx. Pt was last seen by Dr. Alease Hunter on 04/18/24 and was advised we would contact our [employer] rep about getting him a medial off-loader knee brace.  Today, pt reports ***  Dx imaging: 03/26/24 R knee MRI 02/08/24 R knee XR 02/28/21 R knee XR  Pertinent review of systems: ***  Relevant historical information: ***   Exam:  There were no vitals taken for this visit. General: Well Developed, well nourished, and in no acute distress.   MSK: ***    Lab and Radiology Results No results found for this or any previous visit (from the past 72 hours). No results found.     Assessment and Plan: 53 y.o. male with ***   PDMP not reviewed this encounter. No orders of the defined types were placed in this encounter.  No orders of the defined types were placed in this encounter.    Discussed warning signs or symptoms. Please see discharge instructions. Patient expresses understanding.   ***

## 2024-05-02 ENCOUNTER — Other Ambulatory Visit: Payer: Self-pay

## 2024-05-02 ENCOUNTER — Encounter: Payer: Self-pay | Admitting: Family Medicine

## 2024-05-02 ENCOUNTER — Ambulatory Visit: Admitting: Family Medicine

## 2024-05-02 VITALS — BP 120/82 | HR 79 | Ht 68.0 in | Wt 268.0 lb

## 2024-05-02 DIAGNOSIS — G8929 Other chronic pain: Secondary | ICD-10-CM | POA: Diagnosis not present

## 2024-05-02 DIAGNOSIS — M25562 Pain in left knee: Secondary | ICD-10-CM

## 2024-05-02 NOTE — Patient Instructions (Addendum)
Thank you for coming in today.   You received an injection today. Seek immediate medical attention if the joint becomes red, extremely painful, or is oozing fluid.   Let me know how this goes.

## 2024-05-02 NOTE — Progress Notes (Signed)
   I, Miquel Amen, CMA acting as a scribe for Garlan Juniper, MD.  Scott Phillips is a 53 y.o. male who presents to [facility] at [facility] today for f/u bilat knee pain. R knee subchondral fx. Pt was last seen by Dr. Alease Hunter on 04/18/24 and was advised we would contact our [employer] rep about getting him a medial off-loader knee brace.  Today, pt reports worsening pain over the past week. Locates pain deep, distal to patella,slightly medial. Some swelling. Has been using ice prn and taking Aleve. Ambulating with crutches today. Grinding sensation in the knee.   Dx imaging: 03/26/24 R knee MRI 02/08/24 R knee XR 02/28/21 R knee XR   Pertinent review of systems: No fevers or chills  Relevant historical information: Right knee subchondral insufficiency fracture.   Exam:  BP 120/82   Pulse 79   Ht 5\' 8"  (1.727 m)   Wt 268 lb (121.6 kg)   SpO2 97%   BMI 40.75 kg/m  General: Well Developed, well nourished, and in no acute distress.   MSK: Left knee minimal effusion normal motion.  Patient uses crutches to ambulate.    Lab and Radiology Results  Procedure: Real-time Ultrasound Guided Injection of left knee joint superior lateral patella space Device: Philips Affiniti 50G/GE Logiq Images permanently stored and available for review in PACS Verbal informed consent obtained.  Discussed risks and benefits of procedure. Warned about infection, bleeding, hyperglycemia damage to structures among others. Patient expresses understanding and agreement Time-out conducted.   Noted no overlying erythema, induration, or other signs of local infection.   Skin prepped in a sterile fashion.   Local anesthesia: Topical Ethyl chloride.   With sterile technique and under real time ultrasound guidance: 40 mg of Kenalog and 2 mL of Marcaine injected into knee joint. Fluid seen entering the joint capsule.   Completed without difficulty   Pain immediately resolved suggesting accurate  placement of the medication.   Advised to call if fevers/chills, erythema, induration, drainage, or persistent bleeding.   Images permanently stored and available for review in the ultrasound unit.  Impression: Technically successful ultrasound guided injection.       Assessment and Plan: 53 y.o. male with left knee pain occurring in the setting of previous right knee pain due to subchondral insufficiency fracture.  He is using crutches primarily to help reduce weightbearing on his right hand which is increasing weightbearing on his left knee which is causing some pain.  Plan for left knee steroid injection today.  He is getting fitted with a medial off loader knee brace for his right knee next week which I think will help quite a lot.  Check back as needed.   PDMP not reviewed this encounter. Orders Placed This Encounter  Procedures   US  LIMITED JOINT SPACE STRUCTURES LOW LEFT(NO LINKED CHARGES)    Reason for Exam (SYMPTOM  OR DIAGNOSIS REQUIRED):   left knee pain    Preferred imaging location?:    [facility]   No orders of the defined types were placed in this encounter.    Discussed warning signs or symptoms. Please see discharge instructions. Patient expresses understanding.   The above documentation has been reviewed and is accurate and complete Garlan Juniper, M.D.

## 2024-05-17 DIAGNOSIS — M65342 Trigger finger, left ring finger: Secondary | ICD-10-CM | POA: Diagnosis not present

## 2024-05-18 ENCOUNTER — Ambulatory Visit: Admitting: Family Medicine

## 2024-05-25 ENCOUNTER — Encounter (INDEPENDENT_AMBULATORY_CARE_PROVIDER_SITE_OTHER): Payer: Self-pay | Admitting: Otolaryngology

## 2024-05-25 ENCOUNTER — Ambulatory Visit (INDEPENDENT_AMBULATORY_CARE_PROVIDER_SITE_OTHER): Payer: BC Managed Care – PPO | Admitting: Otolaryngology

## 2024-05-25 VITALS — BP 122/80 | HR 77 | Ht 68.0 in | Wt 245.0 lb

## 2024-05-25 DIAGNOSIS — J310 Chronic rhinitis: Secondary | ICD-10-CM | POA: Diagnosis not present

## 2024-05-25 DIAGNOSIS — R0981 Nasal congestion: Secondary | ICD-10-CM

## 2024-05-25 DIAGNOSIS — J343 Hypertrophy of nasal turbinates: Secondary | ICD-10-CM

## 2024-05-25 DIAGNOSIS — J320 Chronic maxillary sinusitis: Secondary | ICD-10-CM

## 2024-05-25 NOTE — Progress Notes (Signed)
 Patient ID: Scott Phillips, male   DOB: April 20, 1971, 53 y.o.   MRN: [id number]  Follow up: Chronic maxillary sinusitis, fungus ball   HPI: The patient is a 53 year old male who returns today for his follow-up evaluation.  The patient was previously treated for his chronic right fungal maxillary sinusitis.  He underwent a right maxillary antrostomy surgery in July 2021.  At his last visit 6 months ago, his right maxillary antrum was widely patent.  No infection was noted.  He was instructed to continue his steroid nasal spray.  The patient returns today reporting occasional nasal congestion during the spring allergy season.  Currently he denies any facial pain or fever.   Exam: General: Communicates without difficulty, well nourished, no acute distress. Head: Normocephalic, no evidence injury, no tenderness, facial buttresses intact without stepoff. Face/sinus: No tenderness to palpation and percussion. Facial movement is normal and symmetric. Eyes: PERRL, EOMI. No scleral icterus, conjunctivae clear. Neuro: CN II exam reveals vision grossly intact.  No nystagmus at any point of gaze. Ears: Auricles well formed without lesions.  Ear canals are intact without mass or lesion.  No erythema or edema is appreciated.  The TMs are intact without fluid. Nose: External evaluation reveals normal support and skin without lesions.  Dorsum is intact.  Anterior rhinoscopy reveals congested mucosa over anterior aspect of inferior turbinates and intact septum.  No purulence noted. Oral:  Oral cavity and oropharynx are intact, symmetric, without erythema or edema.  Mucosa is moist without lesions. Neck: Full range of motion without pain.  There is no significant lymphadenopathy.  No masses palpable.  Thyroid bed within normal limits to palpation.  Parotid glands and submandibular glands equal bilaterally without mass.  Trachea is midline. Neuro:  CN 2-12 grossly intact.     Assessment: 1.  Chronic rhinitis with diffuse  nasal mucosal congestion. 2.  History of chronic right fungal maxillary sinusitis.  No recurrent disease is noted today.   Plan: 1.  The physical exam findings are reviewed with the patient.  2.  Continue with nasal saline irrigation and Flonase nasal spray.  3.  The patient will return for re-evaluation in 6 months, sooner if needed.

## 2024-05-31 NOTE — Progress Notes (Unsigned)
   Joanna Muck, PhD, LAT, ATC acting as a scribe for Garlan Juniper, MD.   Scott Phillips is a 53 y.o. male who presents to [facility] at [facility] today for f/u bilat knee pain; R knee subchondral fx. Pt was last seen by Dr. Alease Hunter on 05/02/24 and was given a L knee steroid injection and advised to cont medial off-loader knee brace on the R.  Today, pt reports L knee is feeling good. R knee is fine as long as he is wearing the knee brace.    Dx imaging: 03/26/24 R knee MRI 02/08/24 R knee XR 02/28/21 R knee XR  Pertinent review of systems: No fevers or chills  Relevant historical information: Sleep apnea.   Exam:  BP 122/84   Pulse 81   Ht 5\' 8"  (1.727 m)   Wt 243 lb (110.2 kg)   SpO2 99%   BMI 36.95 kg/m  General: Well Developed, well nourished, and in no acute distress.   MSK: Right knee wearing medial off loader knee brace normal motion.         Assessment and Plan: 53 y.o. male with right knee pain due to subchondral fracture seen on MRI.  Doing much better with a medial off loader knee brace.  Additionally has a severe DJD of the patellofemoral joint on the right side which is going to be contributory to pain in the future.  Plan to continue medial off loader knee brace for about a month longer and wean off of the brace as tolerated.  Recommend continuing use the brace with heavy activity in the future.  Check back with me as needed.  PDMP not reviewed this encounter. No orders of the defined types were placed in this encounter.  No orders of the defined types were placed in this encounter.    Discussed warning signs or symptoms. Please see discharge instructions. Patient expresses understanding.   The above documentation has been reviewed and is accurate and complete Garlan Juniper, M.D.

## 2024-06-01 ENCOUNTER — Ambulatory Visit: Admitting: Family Medicine

## 2024-06-01 VITALS — BP 122/84 | HR 81 | Ht 68.0 in | Wt 243.0 lb

## 2024-06-01 DIAGNOSIS — G8929 Other chronic pain: Secondary | ICD-10-CM | POA: Diagnosis not present

## 2024-06-01 DIAGNOSIS — M25561 Pain in right knee: Secondary | ICD-10-CM | POA: Diagnosis not present

## 2024-06-01 DIAGNOSIS — M1711 Unilateral primary osteoarthritis, right knee: Secondary | ICD-10-CM | POA: Diagnosis not present

## 2024-06-01 DIAGNOSIS — S82141D Displaced bicondylar fracture of right tibia, subsequent encounter for closed fracture with routine healing: Secondary | ICD-10-CM

## 2024-06-01 NOTE — Patient Instructions (Signed)
 Thank you for coming in today.

## 2024-06-06 DIAGNOSIS — M25642 Stiffness of left hand, not elsewhere classified: Secondary | ICD-10-CM | POA: Diagnosis not present

## 2024-06-20 DIAGNOSIS — M25642 Stiffness of left hand, not elsewhere classified: Secondary | ICD-10-CM | POA: Diagnosis not present

## 2024-06-21 ENCOUNTER — Encounter: Payer: Self-pay | Admitting: Internal Medicine

## 2024-06-21 ENCOUNTER — Ambulatory Visit: Admitting: Student

## 2024-06-21 ENCOUNTER — Encounter: Payer: Self-pay | Admitting: Student

## 2024-06-21 VITALS — BP 122/86 | HR 79 | Temp 98.2°F | Resp 12 | Ht 68.0 in | Wt 242.4 lb

## 2024-06-21 DIAGNOSIS — M109 Gout, unspecified: Secondary | ICD-10-CM | POA: Insufficient documentation

## 2024-06-21 MED ORDER — COLCHICINE 0.6 MG PO TABS: 0.6000 mg | ORAL_TABLET | Freq: Every day | ORAL | 0 refills | Status: DC | PRN

## 2024-06-21 MED ORDER — PREDNISONE 20 MG PO TABS: 40.0000 mg | ORAL_TABLET | Freq: Every day | ORAL | 0 refills | Status: AC

## 2024-06-21 NOTE — Progress Notes (Signed)
 Subjective:     Patient ID: Scott Phillips, male    DOB: 22-May-1971, 53 y.o.   MRN: [id number]  Chief Complaint  Patient presents with   toe pain right great toe    HPI  Scott Phillips presents for acute visit.  Patient reports right toe pain that began  over 1 week ago. He states the pain started about an hour after he fell asleep and slipped from a chair at work. Since then, the toe has been warm, swollen, and tender to touch, with moderate to severe pain. He has attempted ice, heat, and Aleve without significant relief. He is able to bear weight, though it remains tender. Denies prior history of gout but endorses regular intake of red meat, bacon, and regular beer consumption.  Patient denies fever, chills, SOB, CP, palpitations, dyspnea, edema, HA, vision changes, N/V/D, abdominal pain, urinary symptoms, rash, weight changes, and recent illness or hospitalizations.     History of Present Illness            Health Maintenance Due  Topic Date Due   Hepatitis B Vaccines (1 of 3 - 19+ 3-dose series) Never done   Lung Cancer Screening  Never done   Zoster Vaccines- Shingrix (1 of 2) Never done    Past Medical History:  Diagnosis Date   Allergic rhinosinusitis    Allergy    Arthritis    Colon polyps    Depression    Diverticulitis    GERD (gastroesophageal reflux disease)    Sinusitis    Sleep apnea     Past Surgical History:  Procedure Laterality Date   CARPAL TUNNEL RELEASE     KNEE ARTHROSCOPY Right 1991   cyst removal   MAXILLARY ANTROSTOMY Right 06/29/2020   Procedure: RIGHT MAXILLARY ANTROSTOMY WITH TISSUE REMOVAL;  Surgeon: Karis Clunes, MD;  Location: [facility];  Service: ENT;  Laterality: Right;   NASAL SINUS SURGERY     x3 in [place]   SUBACROMIAL DECOMPRESSION Left    TRIGGER FINGER RELEASE Right 2018   R 3rd   VASECTOMY  2019    Family History  Problem Relation Age of Onset   Cancer Mother    Depression Mother     Hypertension Mother    Miscarriages / [place] Mother    Cancer Father    Alcohol abuse Father    Arthritis Father    Diabetes Father    Hearing loss Father    Allergic rhinitis Sister    Depression Maternal Grandmother    Hearing loss Maternal Grandfather    Asthma Maternal Grandfather    Diabetes Paternal Grandfather    Colon cancer Neg Hx    Prostate cancer Neg Hx    CAD Neg Hx     Social History   Socioeconomic History   Marital status: Married    Spouse name: Not on file   Number of children: 1   Years of education: Not on file   Highest education level: Bachelor's degree (e.g., BA, AB, BS)  Occupational History   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  Tobacco Use   Smoking status: Former    Current packs/day: 0.00    Average packs/day: 2.0 packs/day for 23.0 years (46.0 ttl pk-yrs)    Types: Cigarettes    Start date: 12/30/1991    Quit date: 12/29/2014    Years since quitting: 9.4   Smokeless tobacco: Never  Vaping Use   Vaping status: Never  Used  Substance and Sexual Activity   Alcohol use: Yes    Comment: occas   Drug use: Never   Sexual activity: Not on file  Other Topics Concern   Not on file  Social History Narrative   Household: pt, wife and Daughter born  2018   Social Drivers of Health   Financial Resource Strain: Low Risk  (12/21/2023)   Overall Financial Resource Strain (CARDIA)    Difficulty of Paying Living Expenses: Not hard at all  Food Insecurity: No Food Insecurity (12/21/2023)   Hunger Vital Sign    Worried About Running Out of Food in the Last Year: Never true    Ran Out of Food in the Last Year: Never true  Transportation Needs: No Transportation Needs (12/21/2023)   PRAPARE - Administrator, Civil Service (Medical): No    Lack of Transportation (Non-Medical): No  Physical Activity: Inactive (12/21/2023)   Exercise Vital Sign    Days of Exercise per Week: 0 days    Minutes of Exercise per Session: 10 min   Stress: Stress Concern Present (12/21/2023)   [employer] of Occupational Health - Occupational Stress Questionnaire    Feeling of Stress : To some extent  Social Connections: Moderately Integrated (12/21/2023)   Social Connection and Isolation Panel    Frequency of Communication with Friends and Family: Three times a week    Frequency of Social Gatherings with Friends and Family: Twice a week    Attends Religious Services: More than 4 times per year    Active Member of [employer] or Organizations: No    Attends Banker Meetings: Not on file    Marital Status: Married  Intimate Partner Violence: Unknown (04/20/2020)   Received from [facility]   Intimate Partner Violence    Fear of Current or Ex-Partner: Not asked    Emotionally Abused: Not asked    Physically Abused: Not asked    Sexually Abused: Not asked    Outpatient Medications Prior to Visit  Medication Sig Dispense Refill   b complex vitamins tablet Take 1 tablet by mouth 3 (three) times a week.     Calcium-Magnesium-Zinc (CALCIUM-MAGNESUIUM-ZINC PO) Take by mouth 2 (two) times daily.     Carbinoxamine  Maleate 4 MG TABS Take 1 tablet (4 mg total) by mouth every 6 (six) hours as needed. 60 tablet 3   famotidine  (PEPCID ) 20 MG tablet Take 1 tablet (20 mg total) by mouth 2 (two) times daily before a meal. 180 tablet 1   melatonin 5 MG TABS Take 5 mg by mouth at bedtime.     METAMUCIL FIBER PO Take by mouth.     Misc Natural Products (TURMERIC CURCUMIN) CAPS Take 1,000 each by mouth.     Multiple Vitamin (MULTIVITAMIN ADULT PO) Take by mouth.     pantoprazole  (PROTONIX ) 40 MG tablet Take 1 tablet (40 mg total) by mouth 2 (two) times daily before a meal. 180 tablet 2   Probiotic Product (PROBIOTIC-10 PO) Take by mouth.     No facility-administered medications prior to visit.    Allergies  Allergen Reactions   Milk-Related Compounds Other (See Comments)    ROS    See HPI Objective:    Physical  Exam  General: No acute distress. Awake and conversant.  Eyes: Normal conjunctiva, anicteric. Round symmetric pupils.  Neck: Neck is supple. No masses or thyromegaly.  Respiratory: CTAB. Respirations are non-labored.  Skin: Warm. Dry. Psych: Alert and oriented. Cooperative, Appropriate  mood and affect, Normal judgment.  CV: RRR. No murmur. No lower extremity edema.  MSK: Normal ambulation.  +Right first MTP joint erythematous, swollen, and TWP. Full ROM. Left MTP- WNL Neuro:  CN II-XII grossly normal.      BP 122/86 (BP Location: Right Arm, Patient Position: Sitting, Cuff Size: Normal)   Pulse 79   Temp 98.2 F (36.8 C) (Oral)   Resp 12   Ht 5' 8 (1.727 m)   Wt 242 lb 6.4 oz (110 kg)   SpO2 96%   BMI 36.86 kg/m  Wt Readings from Last 3 Encounters:  06/21/24 242 lb 6.4 oz (110 kg)  06/01/24 243 lb (110.2 kg)  05/25/24 245 lb (111.1 kg)       Assessment & Plan:   Problem List Items Addressed This Visit   None Visit Diagnoses       Acute gout involving toe of left foot, unspecified cause    -  Primary      Rx-Prednisone  40 mg for  5 days  and colchicine prn Lifestyle factors for gout management/prevention: - Avoid added sugars, ETOH -Avoid high-purine foods such as red meats, organ meats (like liver and kidney), shellfish, and oily fish like sardines and mackerel. Limit alcohol (especially beer) and sugary beverages containing high-fructose corn syrup, as these can increase uric acid levels. - Use low-fat dairy, plant-based proteins, complex carbs instead of refined/simple carbs, less saturated fats - Stay well hydrated and decrease alcohol intake to less than 2 drinks per day for men - Cherries/cherry juice has been associated with less frequent gout attacks   follow-up in 2 weeks if symptoms persist or worsen.  Patient was educated on the diagnosis, treatment options, potential risks, benefits, and alternatives. All questions were addressed. Patient verbalized  understanding and agrees with the plan of care. Will follow up as advised or sooner if symptoms worsen or new concerns arise.  Portions of this note were dictated using DRAGON voice recognition software. Please disregard any errors in transcription.       I am having Belvie T. Eberle maintain his Turmeric Curcumin, Multiple Vitamin (MULTIVITAMIN ADULT PO), b complex vitamins, Calcium-Magnesium-Zinc (CALCIUM-MAGNESUIUM-ZINC PO), famotidine , Probiotic Product (PROBIOTIC-10 PO), METAMUCIL FIBER PO, melatonin, Carbinoxamine  Maleate, and pantoprazole .  No orders of the defined types were placed in this encounter.

## 2024-07-06 ENCOUNTER — Encounter: Payer: Self-pay | Admitting: Student

## 2024-07-06 ENCOUNTER — Encounter: Payer: Self-pay | Admitting: Family Medicine

## 2024-07-06 MED ORDER — COLCHICINE 0.6 MG PO TABS: 0.6000 mg | ORAL_TABLET | Freq: Every day | ORAL | 0 refills | Status: DC | PRN

## 2024-08-05 ENCOUNTER — Ambulatory Visit: Admitting: Student

## 2024-08-05 ENCOUNTER — Ambulatory Visit: Payer: Self-pay

## 2024-08-05 VITALS — BP 112/78 | HR 75 | Temp 98.1°F | Resp 16 | Ht 68.0 in | Wt 244.2 lb

## 2024-08-05 DIAGNOSIS — M545 Low back pain, unspecified: Secondary | ICD-10-CM | POA: Diagnosis not present

## 2024-08-05 MED ORDER — MELOXICAM 15 MG PO TABS: 15.0000 mg | ORAL_TABLET | Freq: Every day | ORAL | 0 refills | Status: DC

## 2024-08-05 NOTE — Progress Notes (Signed)
.  [email or web address]  Acute Office Visit  Subjective:     Patient ID: Scott Phillips, male    DOB: 09-07-1971, 53 y.o.   MRN: [id number]  Chief Complaint  Patient presents with   Acute Visit    Patient presents today for low back pain for 1 week. He have tried aleve, muscle relaxer and heating pad.    HPI Patient is in today for acute back pain.  BACK PAIN Duration: 1 week, slept wrong on bed while on vacation. He has used aleve and old muscle relaxer. Rx with minimal relief.  Mechanism of injury: no trauma, fall, injury. Location: low back, right side Has taken aleve, dad left over muscle relaxer's  Severity: 8/10 Quality:  Frequency: intermittent Radiation: none, buttocks, R leg below the knee, R leg above the knee, L leg below the knee, and L leg above the knee Aggravating factors: none Alleviating factors:  Treatments attempted: ice, heat, ibuprofen, and aleve  Relief with NSAIDs?: mild Nighttime pain:  yes Paresthesias / decreased sensation:  no Bowel / bladder incontinence:  no Fevers:  no Dysuria / urinary frequency:  no   Denies fall, denies injury, denies trauma.  Patient denies fever, chills, SOB, CP, palpitations, dyspnea, edema, HA, vision changes, N/V/D, abdominal pain, urinary symptoms, rash, weight changes, and recent illness or hospitalizations.    ROS  See HPI    Objective:    BP 112/78   Pulse 75   Temp 98.1 F (36.7 C)   Resp 16   Ht 5' 8 (1.727 m)   Wt 244 lb 3.2 oz (110.8 kg)   SpO2 97%   BMI 37.13 kg/m    Physical Exam General: No acute distress. Awake and conversant.  Eyes: Normal conjunctiva, anicteric. Round symmetric pupils.  Respiratory: CTAB. Respirations are non-labored. No wheezing.  Skin: Warm. No rashes or ulcers.  Psych: Alert and oriented. Cooperative, Appropriate mood and affect, Normal judgment.  CV: RRR. No murmur. No lower extremity edema.  MSK: Normal ambulation. No clubbing or cyanosis. Back0 Lower right side mild TWP.  Full ROM. Neuro:  CN II-XII grossly normal.        No results found for any visits on 08/05/24.      Assessment & Plan:   Problem List Items Addressed This Visit     Acute low back pain without sciatica - Primary   Relevant Medications   meloxicam  (MOBIC ) 15 MG tablet   Patient instructed to not take this medication with other NSAIDs.  Take with food to avoid GI irritation. Encouraged moist heat and gentle stretching as tolerated. Report if symptoms worsen or seek immediate care   FU PCP CPE   Meds ordered this encounter  Medications   meloxicam  (MOBIC ) 15 MG tablet    Sig: Take 1 tablet (15 mg total) by mouth daily.    Dispense:  10 tablet    Refill:  0    Supervising Provider:   DOMENICA BLACKBIRD A [[id number]]    Return in about 6 weeks (around 09/16/2024), or CPE with PCP.  Soleil Mas L Damont Balles, NP

## 2024-08-05 NOTE — Telephone Encounter (Signed)
 FYI Only or Action Required?: FYI only for provider.  Patient was last seen in primary care on 06/21/2024 by Wheeler Harlene CROME, NP.  Called Nurse Triage reporting No chief complaint on file..  Symptoms began several weeks ago.  Interventions attempted: Prescription medications: aleve and rx muscle relaxant.  Symptoms are: gradually worsening.  Triage Disposition: See HCP Within 4 Hours (Or PCP Triage)  Patient/caregiver understands and will follow disposition?: Yes Reason for Disposition  [1] SEVERE back pain (e.g., excruciating, unable to do any normal activities) AND [2] not improved 2 hours after pain medicine  Answer Assessment - Initial Assessment Questions 1. ONSET: When did the pain begin? (e.g., minutes, hours, days)     One week  2. LOCATION: Where does it hurt? (upper, mid or lower back)     Lower back  3. SEVERITY: How bad is the pain?  (e.g., Scale 1-10; mild, moderate, or severe)     8-10/10 4. PATTERN: Is the pain constant? (e.g., yes, no; constant, intermittent)      Intermittent, worse with ambulation or when lifting leg  6. CAUSE:  What do you think is causing the back pain?      Slept on a mattress on vacation  8. MEDICINES: What have you taken so far for the pain? (e.g., nothing, acetaminophen , NSAIDS)     Aleve and rx muscle relaxer from previous visit  9. NEUROLOGIC SYMPTOMS: Do you have any weakness, numbness, or problems with bowel/bladder control?     Denies  Protocols used: Back Pain-A-AH Copied from CRM [id number]. Topic: Clinical - Red Word Triage >> Aug 05, 2024 12:25 PM [place] E wrote: [facility] that prompted transfer to Nurse Triage: Back pain. Patient sprained back last weekend, stated pain is a 9 out of 10.

## 2024-08-05 NOTE — Telephone Encounter (Signed)
 Appt scheduled

## 2024-08-15 ENCOUNTER — Ambulatory Visit: Payer: Self-pay

## 2024-08-15 NOTE — Telephone Encounter (Signed)
 FYI Only or Action Required?: FYI only for provider.  Patient was last seen in primary care on 08/05/2024 by Wheeler Harlene CROME, NP.  Called Nurse Triage reporting Foot Pain.  Symptoms began several weeks ago.  Interventions attempted: Nothing.  Symptoms are: unchanged.  Triage Disposition: See Physician Within 24 Hours  Patient/caregiver understands and will follow disposition?: Yes            Copied from CRM [id number]. Topic: Clinical - Red Word Triage >> Aug 15, 2024  8:45 AM Emylou G wrote: [facility] that prompted transfer to Nurse Triage: pain and numbness in left foot ( side where his back was bothering him/ gout ).. was seen last week for back pain.. Reason for Disposition  Numbness (i.e., loss of sensation) in foot or toes  (Exception: Just tingling; numbness present > 2 weeks.)  Answer Assessment - Initial Assessment Questions 1. ONSET: When did the pain start?      X 2.5 wks ago 2. LOCATION: Where is the pain located?      Left foot 3. PAIN: How bad is the pain?    (Scale 1-10; or mild, moderate, severe) 5. CAUSE: What do you think is causing the foot pain?     Unknown 6. OTHER SYMPTOMS: Do you have any other symptoms? (e.g., leg pain, rash, fever, numbness)     Numbness to ball of left foot, intermittent, primarily notices when barefoot  Protocols used: Foot Pain-A-AH

## 2024-08-15 NOTE — Telephone Encounter (Signed)
 Appt scheduled

## 2024-08-16 ENCOUNTER — Encounter: Payer: Self-pay | Admitting: Physician Assistant

## 2024-08-16 ENCOUNTER — Ambulatory Visit: Admitting: Physician Assistant

## 2024-08-16 VITALS — BP 120/68 | HR 76 | Ht 68.0 in | Wt 245.8 lb

## 2024-08-16 DIAGNOSIS — M5442 Lumbago with sciatica, left side: Secondary | ICD-10-CM

## 2024-08-16 DIAGNOSIS — R202 Paresthesia of skin: Secondary | ICD-10-CM

## 2024-08-16 MED ORDER — MELOXICAM 15 MG PO TABS: 15.0000 mg | ORAL_TABLET | Freq: Every day | ORAL | 0 refills | Status: DC

## 2024-08-16 MED ORDER — CYCLOBENZAPRINE HCL 5 MG PO TABS: 5.0000 mg | ORAL_TABLET | Freq: Three times a day (TID) | ORAL | 0 refills | Status: DC | PRN

## 2024-08-16 NOTE — Progress Notes (Signed)
 Established patient visit   Patient: Scott Phillips   DOB: [birth year]   53 y.o. Male  MRN: [id number] Visit Date: 08/16/2024  Today's healthcare provider: Manuelita Flatness, PA-C   Cc. Left foot tingling  Subjective     Discussed the use of AI scribe software for clinical note transcription with the patient, who gave verbal consent to proceed.  History of Present Illness   Scott Phillips is a 53 year old male who presents with back pain and left foot paresthesia.  Back pain began after sleeping on a bad bed at an [employer], developing gradually each morning. It persists despite stretching. Flexeril  and meloxicam  provided minimal relief. The pain radiates to just below the knee on the left side.  Foot paresthesia started around the same time as the back pain. It presents as a sensation of the foot 'falling asleep' when standing barefoot, localized to one spot on the bottom of the foot      Medications: Outpatient Medications Prior to Visit  Medication Sig   b complex vitamins tablet Take 1 tablet by mouth 3 (three) times a week.   Calcium-Magnesium-Zinc (CALCIUM-MAGNESUIUM-ZINC PO) Take by mouth 2 (two) times daily.   famotidine  (PEPCID ) 20 MG tablet Take 1 tablet (20 mg total) by mouth 2 (two) times daily before a meal.   melatonin 5 MG TABS Take 5 mg by mouth at bedtime.   METAMUCIL FIBER PO Take by mouth.   Misc Natural Products (TURMERIC CURCUMIN) CAPS Take 1,000 each by mouth.   Multiple Vitamin (MULTIVITAMIN ADULT PO) Take by mouth.   pantoprazole  (PROTONIX ) 40 MG tablet Take 1 tablet (40 mg total) by mouth 2 (two) times daily before a meal.   Probiotic Product (PROBIOTIC-10 PO) Take by mouth.   [DISCONTINUED] meloxicam  (MOBIC ) 15 MG tablet Take 1 tablet (15 mg total) by mouth daily.   No facility-administered medications prior to visit.    Review of Systems     Objective    BP 120/68   Pulse 76   Ht 5' 8 (1.727 m)   Wt 245 lb 12.8 oz (111.5 kg)   BMI  37.37 kg/m    Physical Exam Vitals reviewed.  Constitutional:      Appearance: He is not ill-appearing.  HENT:     Head: Normocephalic.  Eyes:     Conjunctiva/sclera: Conjunctivae normal.  Cardiovascular:     Rate and Rhythm: Normal rate.  Pulmonary:     Effort: Pulmonary effort is normal. No respiratory distress.  Musculoskeletal:       Feet:  Feet:     Left foot:     Protective Sensation: 4 sites tested.  4 sites sensed.     Skin integrity: Skin integrity normal.     Toenail Condition: Left toenails are normal.     Comments: No edema, erythema, tenderness.  Slightly decreased sensation at site of paresthesias above Neurological:     Mental Status: He is alert and oriented to person, place, and time.  Psychiatric:        Mood and Affect: Mood normal.        Behavior: Behavior normal.     No results found for any visits on 08/16/24.  Assessment & Plan    Paresthesia of left foot Reviewed metabolic causes, will check labs today Sus for relation to low back pain, see low back pain note.  -     CBC with Differential/Platelet -     Comprehensive metabolic panel  with GFR -     Vitamin B12 -     Hemoglobin A1c -     TSH  Acute low back pain with left-sided sciatica, unspecified back pain laterality Still painful pt has plans to see chiro  Cont flexeril , meloxicam .  If pain persists would recommend PT  -     Meloxicam ; Take 1 tablet (15 mg total) by mouth daily.  Dispense: 15 tablet; Refill: 0 -     Cyclobenzaprine  HCl; Take 1 tablet (5 mg total) by mouth 3 (three) times daily as needed for muscle spasms.  Dispense: 30 tablet; Refill: 0    Return if symptoms worsen or fail to improve.       Manuelita Flatness, PA-C  [facility] [phone] (phone) [phone] (fax)  [facility]

## 2024-08-17 ENCOUNTER — Ambulatory Visit: Payer: Self-pay | Admitting: Physician Assistant

## 2024-08-17 LAB — CBC WITH DIFFERENTIAL/PLATELET
Basophils Absolute: 0 K/uL (ref 0.0–0.1)
Basophils Relative: 0.6 % (ref 0.0–3.0)
Eosinophils Absolute: 0.1 K/uL (ref 0.0–0.7)
Eosinophils Relative: 1.3 % (ref 0.0–5.0)
HCT: 40.5 % (ref 39.0–52.0)
Hemoglobin: 14 g/dL (ref 13.0–17.0)
Lymphocytes Relative: 30.9 % (ref 12.0–46.0)
Lymphs Abs: 1.8 K/uL (ref 0.7–4.0)
MCHC: 34.6 g/dL (ref 30.0–36.0)
MCV: 89.1 fl (ref 78.0–100.0)
Monocytes Absolute: 0.9 K/uL (ref 0.1–1.0)
Monocytes Relative: 15.4 % — ABNORMAL HIGH (ref 3.0–12.0)
Neutro Abs: 2.9 K/uL (ref 1.4–7.7)
Neutrophils Relative %: 51.8 % (ref 43.0–77.0)
Platelets: 219 K/uL (ref 150.0–400.0)
RBC: 4.54 Mil/uL (ref 4.22–5.81)
RDW: 13.1 % (ref 11.5–15.5)
WBC: 5.7 K/uL (ref 4.0–10.5)

## 2024-08-17 LAB — VITAMIN B12: Vitamin B-12: 945 pg/mL — ABNORMAL HIGH (ref 211–911)

## 2024-08-17 LAB — COMPREHENSIVE METABOLIC PANEL WITH GFR
ALT: 40 U/L (ref 0–53)
AST: 26 U/L (ref 0–37)
Albumin: 4.4 g/dL (ref 3.5–5.2)
Alkaline Phosphatase: 48 U/L (ref 39–117)
BUN: 15 mg/dL (ref 6–23)
CO2: 27 meq/L (ref 19–32)
Calcium: 9.1 mg/dL (ref 8.4–10.5)
Chloride: 100 meq/L (ref 96–112)
Creatinine, Ser: 1.01 mg/dL (ref 0.40–1.50)
GFR: 86.55 mL/min (ref >60.00)
Glucose, Bld: 100 mg/dL — ABNORMAL HIGH (ref 70–99)
Potassium: 4.4 meq/L (ref 3.5–5.1)
Sodium: 135 meq/L (ref 135–145)
Total Bilirubin: 0.6 mg/dL (ref 0.2–1.2)
Total Protein: 7.1 g/dL (ref 6.0–8.3)

## 2024-08-17 LAB — HEMOGLOBIN A1C: Hgb A1c MFr Bld: 5.3 % (ref 4.6–6.5)

## 2024-08-17 LAB — TSH: TSH: 1.58 u[IU]/mL (ref 0.35–5.50)

## 2024-09-06 DIAGNOSIS — M51372 Other intervertebral disc degeneration, lumbosacral region with discogenic back pain and lower extremity pain: Secondary | ICD-10-CM | POA: Diagnosis not present

## 2024-09-06 DIAGNOSIS — M9903 Segmental and somatic dysfunction of lumbar region: Secondary | ICD-10-CM | POA: Diagnosis not present

## 2024-09-06 DIAGNOSIS — M609 Myositis, unspecified: Secondary | ICD-10-CM | POA: Diagnosis not present

## 2024-09-06 DIAGNOSIS — M9902 Segmental and somatic dysfunction of thoracic region: Secondary | ICD-10-CM | POA: Diagnosis not present

## 2024-09-08 DIAGNOSIS — M9902 Segmental and somatic dysfunction of thoracic region: Secondary | ICD-10-CM | POA: Diagnosis not present

## 2024-09-08 DIAGNOSIS — M51372 Other intervertebral disc degeneration, lumbosacral region with discogenic back pain and lower extremity pain: Secondary | ICD-10-CM | POA: Diagnosis not present

## 2024-09-08 DIAGNOSIS — M609 Myositis, unspecified: Secondary | ICD-10-CM | POA: Diagnosis not present

## 2024-09-08 DIAGNOSIS — M9903 Segmental and somatic dysfunction of lumbar region: Secondary | ICD-10-CM | POA: Diagnosis not present

## 2024-09-11 ENCOUNTER — Ambulatory Visit
Admission: RE | Admit: 2024-09-11 | Discharge: 2024-09-11 | Disposition: A | Discharge status: Home / Self Care | Source: Ambulatory Visit | Attending: Internal Medicine | Admitting: Internal Medicine

## 2024-09-11 VITALS — BP 118/75 | HR 74 | Temp 98.2°F | Resp 15

## 2024-09-11 DIAGNOSIS — B349 Viral infection, unspecified: Secondary | ICD-10-CM | POA: Diagnosis not present

## 2024-09-11 DIAGNOSIS — J028 Acute pharyngitis due to other specified organisms: Secondary | ICD-10-CM | POA: Diagnosis not present

## 2024-09-11 DIAGNOSIS — R051 Acute cough: Secondary | ICD-10-CM | POA: Diagnosis not present

## 2024-09-11 DIAGNOSIS — J029 Acute pharyngitis, unspecified: Secondary | ICD-10-CM

## 2024-09-11 DIAGNOSIS — Z87891 Personal history of nicotine dependence: Secondary | ICD-10-CM | POA: Insufficient documentation

## 2024-09-11 DIAGNOSIS — B9789 Other viral agents as the cause of diseases classified elsewhere: Secondary | ICD-10-CM | POA: Insufficient documentation

## 2024-09-11 LAB — POC SOFIA SARS ANTIGEN FIA: SARS Coronavirus 2 Ag: NEGATIVE

## 2024-09-11 LAB — POCT RAPID STREP A (OFFICE): Rapid Strep A Screen: NEGATIVE

## 2024-09-11 MED ORDER — PROMETHAZINE-DM 6.25-15 MG/5ML PO SYRP
5.0000 mL | ORAL_SOLUTION | Freq: Every evening | ORAL | 0 refills | Status: DC | PRN
Start: 2024-09-11 — End: 2024-10-26

## 2024-09-11 NOTE — ED Triage Notes (Addendum)
 Pt states on Thursday he developed severe sore throat, congestion, cough, fever that comes and goes.

## 2024-09-11 NOTE — Discharge Instructions (Addendum)

## 2024-09-11 NOTE — ED Provider Notes (Signed)
 [facility]    CSN: [id number] Arrival date & time: 09/11/24  1131      History   Chief Complaint Chief Complaint  Patient presents with   Sore Throat    Cough, congestion, low grade fever comes and goes. - Entered by patient    HPI Scott Phillips is a 53 y.o. male.   Scott Phillips is a 53 y.o. male presenting for chief complaint of sore throat, cough, congestion, and intermittent fever/chills that started 3 days ago on Thursday, September 08, 2024.  Today is day 4 of his symptoms.  His wife is sick with similar symptoms.  Cough is minimally productive and is mostly dry/worse at nighttime.  Max temp at home was 101.  Fever responded well to use of Tylenol .  Denies nausea, vomiting, diarrhea, abdominal pain, rash, dizziness, shortness of breath, chest pain, heart palpitations, leg swelling, and orthopnea.  Denies history of asthma/COPD.  Former cigarette smoker.  Using Sudafed, Flonase, ibuprofen, and Tylenol  for symptoms with some relief prior to arrival.   Sore Throat    Past Medical History:  Diagnosis Date   Allergic rhinosinusitis    Allergy    Arthritis    Colon polyps    Depression    Diverticulitis    GERD (gastroesophageal reflux disease)    Sinusitis    Sleep apnea     Patient Active Problem List   Diagnosis Date Noted   Acute low back pain without sciatica 08/05/2024   Acute gout involving toe of right foot 06/21/2024   Hypertrophy of nasal turbinates 11/18/2023   Allergic conjunctivitis of both eyes 04/23/2022   Patellofemoral pain syndrome of right knee 01/16/2021   Perennial allergic rhinitis 11/15/2020   Chronic maxillary sinusitis 11/15/2020   Allergic conjunctivitis 11/15/2020   Annual physical exam 10/14/2020   Serrated polyp of colon 07/13/2020   PCP notes >>>>>>>>>> 07/13/2020   GERD 06/26/2020   OSA (obstructive sleep apnea) 06/26/2020   HPV (human papilloma virus) anogenital infection 04/02/2001    Past Surgical  History:  Procedure Laterality Date   CARPAL TUNNEL RELEASE     KNEE ARTHROSCOPY Right 1991   cyst removal   MAXILLARY ANTROSTOMY Right 06/29/2020   Procedure: RIGHT MAXILLARY ANTROSTOMY WITH TISSUE REMOVAL;  Surgeon: Karis Clunes, MD;  Location: [facility];  Service: ENT;  Laterality: Right;   NASAL SINUS SURGERY     x3 in [place]   SUBACROMIAL DECOMPRESSION Left    TRIGGER FINGER RELEASE Right 2018   R 3rd   VASECTOMY  2019       Home Medications    Prior to Admission medications   Medication Sig Start Date End Date Taking? Authorizing Provider  promethazine -dextromethorphan (PROMETHAZINE -DM) 6.25-15 MG/5ML syrup Take 5 mLs by mouth at bedtime as needed for cough. 09/11/24  Yes Enedelia Dorna HERO, FNP  b complex vitamins tablet Take 1 tablet by mouth 3 (three) times a week.    [provider]  Calcium-Magnesium-Zinc (CALCIUM-MAGNESUIUM-ZINC PO) Take by mouth 2 (two) times daily.    [provider]  cyclobenzaprine  (FLEXERIL ) 5 MG tablet Take 1 tablet (5 mg total) by mouth 3 (three) times daily as needed for muscle spasms. 08/16/24   Cyndi Shaver, PA-C  famotidine  (PEPCID ) 20 MG tablet Take 1 tablet (20 mg total) by mouth 2 (two) times daily before a meal. 07/10/20   Paz, Jose E, MD  melatonin 5 MG TABS Take 5 mg by mouth at bedtime.  [provider]  meloxicam  (MOBIC ) 15 MG tablet Take 1 tablet (15 mg total) by mouth daily. 08/16/24   Cyndi Shaver, PA-C  METAMUCIL FIBER PO Take by mouth.    [provider]  Misc Natural Products (TURMERIC CURCUMIN) CAPS Take 1,000 each by mouth.    [provider]  Multiple Vitamin (MULTIVITAMIN ADULT PO) Take by mouth.    [provider]  pantoprazole  (PROTONIX ) 40 MG tablet Take 1 tablet (40 mg total) by mouth 2 (two) times daily before a meal. 03/18/24   Amon Aloysius BRAVO, MD  Probiotic Product (PROBIOTIC-10 PO) Take by mouth.    [provider]    Family  History Family History  Problem Relation Age of Onset   Cancer Mother    Depression Mother    Hypertension Mother    Miscarriages / [place] Mother    Cancer Father    Alcohol abuse Father    Arthritis Father    Diabetes Father    Hearing loss Father    Allergic rhinitis Sister    Depression Maternal Grandmother    Hearing loss Maternal Grandfather    Asthma Maternal Grandfather    Diabetes Paternal Grandfather    Colon cancer Neg Hx    Prostate cancer Neg Hx    CAD Neg Hx     Social History Social History   Tobacco Use   Smoking status: Former    Current packs/day: 0.00    Average packs/day: 2.0 packs/day for 23.0 years (46.0 ttl pk-yrs)    Types: Cigarettes    Start date: 12/30/1991    Quit date: 12/29/2014    Years since quitting: 9.7   Smokeless tobacco: Never  Vaping Use   Vaping status: Never Used  Substance Use Topics   Alcohol use: Yes    Comment: occas   Drug use: Never     Allergies   Milk-related compounds   Review of Systems Review of Systems Per HPI  Physical Exam Triage Vital Signs ED Triage Vitals  Encounter Vitals Group     BP 09/11/24 1139 118/75     Girls Systolic BP Percentile --      Girls Diastolic BP Percentile --      Boys Systolic BP Percentile --      Boys Diastolic BP Percentile --      Pulse Rate 09/11/24 1139 74     Resp 09/11/24 1139 15     Temp 09/11/24 1139 98.2 F (36.8 C)     Temp Source 09/11/24 1139 Oral     SpO2 09/11/24 1139 96 %     Weight --      Height --      Head Circumference --      Peak Flow --      Pain Score 09/11/24 1151 3     Pain Loc --      Pain Education --      Exclude from Growth Chart --    No data found.  Updated Vital Signs BP 118/75 (BP Location: Left Arm)   Pulse 74   Temp 98.2 F (36.8 C) (Oral)   Resp 15   SpO2 96%   Visual Acuity Right Eye Distance:   Left Eye Distance:   Bilateral Distance:    Right Eye Near:   Left Eye Near:    Bilateral Near:     Physical  Exam Vitals and nursing note reviewed.  Constitutional:      Appearance: He is not ill-appearing  or toxic-appearing.  HENT:     Head: Normocephalic and atraumatic.     Right Ear: Hearing, tympanic membrane, ear canal and external ear normal.     Left Ear: Hearing, tympanic membrane, ear canal and external ear normal.     Nose: Congestion present.     Mouth/Throat:     Lips: Pink.     Mouth: Mucous membranes are moist. No injury or oral lesions.     Dentition: Normal dentition.     Tongue: No lesions.     Pharynx: Uvula midline. Pharyngeal swelling and posterior oropharyngeal erythema present. No oropharyngeal exudate, uvula swelling or postnasal drip.     Tonsils: No tonsillar exudate. 2+ on the right. 2+ on the left.  Eyes:     General: Lids are normal. Vision grossly intact. Gaze aligned appropriately.     Extraocular Movements: Extraocular movements intact.     Conjunctiva/sclera: Conjunctivae normal.  Neck:     Trachea: Trachea and phonation normal.  Cardiovascular:     Rate and Rhythm: Normal rate and regular rhythm.     Heart sounds: Normal heart sounds, S1 normal and S2 normal.  Pulmonary:     Effort: Pulmonary effort is normal. No respiratory distress.     Breath sounds: Normal breath sounds and air entry.  Musculoskeletal:     Cervical back: Neck supple.  Lymphadenopathy:     Cervical: No cervical adenopathy.  Skin:    General: Skin is warm and dry.     Capillary Refill: Capillary refill takes less than 2 seconds.     Findings: No rash.  Neurological:     General: No focal deficit present.     Mental Status: He is alert and oriented to person, place, and time. Mental status is at baseline.     Cranial Nerves: No dysarthria or facial asymmetry.  Psychiatric:        Mood and Affect: Mood normal.        Speech: Speech normal.        Behavior: Behavior normal.        Thought Content: Thought content normal.        Judgment: Judgment normal.      UC Treatments /  Results  Labs (all labs ordered are listed, but only abnormal results are displayed) Labs Reviewed  POCT RAPID STREP A (OFFICE) - Normal  CULTURE, GROUP A STREP ([facility])  POC SOFIA SARS ANTIGEN FIA    EKG   Radiology No results found.  Procedures Procedures (including critical care time)  Medications Ordered in UC Medications - No data to display  Initial Impression / Assessment and Plan / UC Course  I have reviewed the triage vital signs and the nursing notes.  Pertinent labs & imaging results that were available during my care of the patient were reviewed by me and considered in my medical decision making (see chart for details).   1.  Viral syndrome, viral pharyngitis, acute cough Suspect viral URI, viral syndrome.  Strep/viral testing: Point-of-care COVID-19 and group A strep testing is negative.  Throat culture pending.  Physical exam findings reassuring, vital signs hemodynamically stable, and lungs clear, therefore deferred imaging of the chest.  Advised supportive care/prescriptions for symptomatic relief as outlined in AVS.    Counseled patient on potential for adverse effects with medications prescribed/recommended today, strict ER and return-to-clinic precautions discussed, patient verbalized understanding.    Final Clinical Impressions(s) / UC Diagnoses   Final diagnoses:  Acute cough  Viral pharyngitis  Viral syndrome     Discharge Instructions      You have a viral illness which will improve on its own with rest, fluids, and medications to help with your symptoms.  Tylenol , guaifenesin (plain mucinex), and saline nasal sprays may help relieve symptoms.   Two teaspoons of honey in 1 cup of warm water every 4-6 hours may help with throat pains.  Humidifier in room at nighttime may help soothe cough (clean well daily).   Take Promethazine  DM cough medication to help with your cough at nighttime so that you are able to sleep. Do not drive, drink  alcohol, or go to work while taking this medication since it can make you sleepy. Only take this at nighttime.   For chest pain, shortness of breath, inability to keep food or fluids down without vomiting, fever that does not respond to tylenol  or motrin, or any other severe symptoms, please go to the ER for further evaluation. Return to urgent care as needed, otherwise follow-up with PCP.       ED Prescriptions     Medication Sig Dispense Auth. Provider   promethazine -dextromethorphan (PROMETHAZINE -DM) 6.25-15 MG/5ML syrup Take 5 mLs by mouth at bedtime as needed for cough. 118 mL Enedelia Dorna HERO, FNP      PDMP not reviewed this encounter.   Enedelia Dorna HERO, [place] 09/11/24 1217

## 2024-09-12 DIAGNOSIS — M9902 Segmental and somatic dysfunction of thoracic region: Secondary | ICD-10-CM | POA: Diagnosis not present

## 2024-09-12 DIAGNOSIS — M9903 Segmental and somatic dysfunction of lumbar region: Secondary | ICD-10-CM | POA: Diagnosis not present

## 2024-09-12 DIAGNOSIS — M609 Myositis, unspecified: Secondary | ICD-10-CM | POA: Diagnosis not present

## 2024-09-12 DIAGNOSIS — M51372 Other intervertebral disc degeneration, lumbosacral region with discogenic back pain and lower extremity pain: Secondary | ICD-10-CM | POA: Diagnosis not present

## 2024-09-13 LAB — CULTURE, GROUP A STREP (THRC)

## 2024-09-19 ENCOUNTER — Ambulatory Visit: Admitting: Family Medicine

## 2024-09-19 ENCOUNTER — Ambulatory Visit (HOSPITAL_BASED_OUTPATIENT_CLINIC_OR_DEPARTMENT_OTHER)
Admission: RE | Admit: 2024-09-19 | Discharge: 2024-09-19 | Disposition: A | Discharge status: Home / Self Care | Source: Ambulatory Visit | Attending: Family Medicine | Admitting: Family Medicine

## 2024-09-19 ENCOUNTER — Ambulatory Visit: Payer: Self-pay | Admitting: Family Medicine

## 2024-09-19 ENCOUNTER — Encounter: Payer: Self-pay | Admitting: Family Medicine

## 2024-09-19 VITALS — BP 120/76 | HR 63 | Temp 98.0°F | Resp 16 | Ht 68.0 in | Wt 237.4 lb

## 2024-09-19 DIAGNOSIS — R053 Chronic cough: Secondary | ICD-10-CM

## 2024-09-19 DIAGNOSIS — M609 Myositis, unspecified: Secondary | ICD-10-CM | POA: Diagnosis not present

## 2024-09-19 DIAGNOSIS — M9902 Segmental and somatic dysfunction of thoracic region: Secondary | ICD-10-CM | POA: Diagnosis not present

## 2024-09-19 DIAGNOSIS — M51372 Other intervertebral disc degeneration, lumbosacral region with discogenic back pain and lower extremity pain: Secondary | ICD-10-CM | POA: Diagnosis not present

## 2024-09-19 DIAGNOSIS — M9903 Segmental and somatic dysfunction of lumbar region: Secondary | ICD-10-CM | POA: Diagnosis not present

## 2024-09-19 DIAGNOSIS — R059 Cough, unspecified: Secondary | ICD-10-CM | POA: Diagnosis not present

## 2024-09-19 DIAGNOSIS — R5383 Other fatigue: Secondary | ICD-10-CM | POA: Diagnosis not present

## 2024-09-19 LAB — CBC WITH DIFFERENTIAL/PLATELET
Basophils Absolute: 0 K/uL (ref 0.0–0.1)
Basophils Relative: 0.5 % (ref 0.0–3.0)
Eosinophils Absolute: 0.1 K/uL (ref 0.0–0.7)
Eosinophils Relative: 1.1 % (ref 0.0–5.0)
HCT: 40.4 % (ref 39.0–52.0)
Hemoglobin: 14.1 g/dL (ref 13.0–17.0)
Lymphocytes Relative: 32.7 % (ref 12.0–46.0)
Lymphs Abs: 1.8 K/uL (ref 0.7–4.0)
MCHC: 34.9 g/dL (ref 30.0–36.0)
MCV: 88.1 fl (ref 78.0–100.0)
Monocytes Absolute: 0.8 K/uL (ref 0.1–1.0)
Monocytes Relative: 14.2 % — ABNORMAL HIGH (ref 3.0–12.0)
Neutro Abs: 2.9 K/uL (ref 1.4–7.7)
Neutrophils Relative %: 51.5 % (ref 43.0–77.0)
Platelets: 207 K/uL (ref 150.0–400.0)
RBC: 4.59 Mil/uL (ref 4.22–5.81)
RDW: 13.2 % (ref 11.5–15.5)
WBC: 5.6 K/uL (ref 4.0–10.5)

## 2024-09-19 MED ORDER — DOXYCYCLINE HYCLATE 100 MG PO TABS: 100.0000 mg | ORAL_TABLET | Freq: Two times a day (BID) | ORAL | 0 refills | Status: DC

## 2024-09-19 NOTE — Progress Notes (Signed)
 Subjective:    Patient ID: Scott Phillips, male    DOB: 1971-02-25, 53 y.o.   MRN: [id number]  Chief Complaint  Patient presents with  . Acute Visit    Patient presents today for SOB.    HPI Discussed the use of AI scribe software for clinical note transcription with the patient, who gave verbal consent to proceed.  History of Present Illness Scott Phillips is a 53 year old male who presents with persistent respiratory symptoms and shortness of breath.  He has been experiencing respiratory symptoms for over two weeks, initially presenting with fevers, chills, body aches, and congestion. He tested negative for flu and COVID-19 at urgent care. His symptoms have fluctuated, with some improvement followed by worsening.  He has persistent chest congestion and a cough that no longer disturbs his sleep. He experiences shortness of breath after exertion, such as walking upstairs, necessitating rest for several minutes. These episodes are accompanied by flushing, feeling hot, and sweating, lasting about five to ten minutes. No chest pain, but significant fatigue and difficulty catching his breath during these episodes.  He uses Mucinex (guaifenesin) for congestion but can only tolerate half the recommended dose due to nausea. He is not using the DM or D versions. He also uses Sudafed for sinus congestion, which he tolerates well.  His appetite is poor, leading to erratic eating patterns and mild queasiness, attributed to an empty stomach. He eats to prevent feeling unwell rather than due to hunger.  He has a history of sinus problems and typically experiences significant nasal discharge during respiratory infections, which has now cleared up. Currently, he produces thick but clear mucus when coughing.  No ear pain or significant headaches. He has a history of diverticulitis, for which he has previously taken Cipro  and Flagyl.  He is a non-smoker, having quit smoking in the past, and  reports no chronic health issues such as diabetes or hypertension. He has a known allergy to milk but no known antibiotic allergies.    Past Medical History:  Diagnosis Date  . Allergic rhinosinusitis   . Allergy   . Arthritis   . Colon polyps   . Depression   . Diverticulitis   . GERD (gastroesophageal reflux disease)   . Sinusitis   . Sleep apnea     Past Surgical History:  Procedure Laterality Date  . CARPAL TUNNEL RELEASE    . KNEE ARTHROSCOPY Right 1991   cyst removal  . MAXILLARY ANTROSTOMY Right 06/29/2020   Procedure: RIGHT MAXILLARY ANTROSTOMY WITH TISSUE REMOVAL;  Surgeon: Karis Clunes, MD;  Location: [facility];  Service: ENT;  Laterality: Right;  . NASAL SINUS SURGERY     x3 in [place]  . SUBACROMIAL DECOMPRESSION Left   . TRIGGER FINGER RELEASE Right 2018   R 3rd  . VASECTOMY  2019    Family History  Problem Relation Age of Onset  . Cancer Mother   . Depression Mother   . Hypertension Mother   . Miscarriages / [place] Mother   . Cancer Father   . Alcohol abuse Father   . Arthritis Father   . Diabetes Father   . Hearing loss Father   . Allergic rhinitis Sister   . Depression Maternal Grandmother   . Hearing loss Maternal Grandfather   . Asthma Maternal Grandfather   . Diabetes Paternal Grandfather   . Colon cancer Neg Hx   . Prostate cancer Neg Hx   . CAD Neg Hx  Social History   Socioeconomic History  . Marital status: Married    Spouse name: Not on file  . Number of children: 1  . Years of education: Not on file  . Highest education level: Bachelor's degree (e.g., BA, AB, BS)  Occupational History  . 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 Smoking status: Former    Current packs/day: 0.00    Average packs/day: 2.0 packs/day for 23.0 years (46.0 ttl pk-yrs)    Types: Cigarettes    Start date: 12/30/1991    Quit date: 12/29/2014    Years since quitting: 9.7  . Smokeless tobacco: Never  Vaping  Use  . Vaping status: Never Used  Substance and Sexual Activity  . Alcohol use: Yes    Comment: occas  . Drug use: Never  . Sexual activity: Not on file  Other Topics Concern  . Not on file  Social History Narrative   Household: pt, wife and Daughter born  2018   Social Drivers of Health   Financial Resource Strain: Low Risk  (08/05/2024)   Overall Financial Resource Strain (CARDIA)   . Difficulty of Paying Living Expenses: Not hard at all  Food Insecurity: No Food Insecurity (08/05/2024)   Hunger Vital Sign   . Worried About Programme researcher, broadcasting/film/video in the Last Year: Never true   . Ran Out of Food in the Last Year: Never true  Transportation Needs: No Transportation Needs (08/05/2024)   PRAPARE - Transportation   . Lack of Transportation (Medical): No   . Lack of Transportation (Non-Medical): No  Physical Activity: Inactive (08/05/2024)   Exercise Vital Sign   . Days of Exercise per Week: 0 days   . Minutes of Exercise per Session: Not on file  Stress: Stress Concern Present (08/05/2024)   [employer] of Occupational Health - Occupational Stress Questionnaire   . Feeling of Stress: Very much  Social Connections: Moderately Integrated (08/05/2024)   Social Connection and Isolation Panel   . Frequency of Communication with Friends and Family: Twice a week   . Frequency of Social Gatherings with Friends and Family: More than three times a week   . Attends Religious Services: More than 4 times per year   . Active Member of Clubs or Organizations: No   . Attends Banker Meetings: Not on file   . Marital Status: Married  Catering manager Violence: Unknown (04/20/2020)   Received from [facility]   Intimate Partner Violence   . Fear of Current or Ex-Partner: Not asked   . Emotionally Abused: Not asked   . Physically Abused: Not asked   . Sexually Abused: Not asked    Outpatient Medications Prior to Visit  Medication Sig Dispense Refill  . b complex vitamins tablet Take  1 tablet by mouth 3 (three) times a week.    . Calcium-Magnesium-Zinc (CALCIUM-MAGNESUIUM-ZINC PO) Take by mouth 2 (two) times daily.    . famotidine  (PEPCID ) 20 MG tablet Take 1 tablet (20 mg total) by mouth 2 (two) times daily before a meal. 180 tablet 1  . melatonin 5 MG TABS Take 5 mg by mouth at bedtime.    SABRA METAMUCIL FIBER PO Take by mouth.    . Misc Natural Products (TURMERIC CURCUMIN) CAPS Take 1,000 each by mouth.    . Multiple Vitamin (MULTIVITAMIN ADULT PO) Take by mouth.    . pantoprazole  (PROTONIX ) 40 MG tablet Take 1 tablet (40 mg total) by mouth 2 (  two) times daily before a meal. 180 tablet 2  . Probiotic Product (PROBIOTIC-10 PO) Take by mouth.    . promethazine -dextromethorphan (PROMETHAZINE -DM) 6.25-15 MG/5ML syrup Take 5 mLs by mouth at bedtime as needed for cough. 118 mL 0  . cyclobenzaprine  (FLEXERIL ) 5 MG tablet Take 1 tablet (5 mg total) by mouth 3 (three) times daily as needed for muscle spasms. 30 tablet 0  . meloxicam  (MOBIC ) 15 MG tablet Take 1 tablet (15 mg total) by mouth daily. 15 tablet 0   No facility-administered medications prior to visit.    Allergies  Allergen Reactions  . Milk-Related Compounds Other (See Comments)    Review of Systems  Constitutional:  Positive for malaise/fatigue. Negative for fever.  HENT:  Positive for congestion and sore throat.   Eyes:  Negative for blurred vision.  Respiratory:  Positive for cough, sputum production and shortness of breath.   Cardiovascular:  Negative for chest pain, palpitations and leg swelling.  Gastrointestinal:  Negative for abdominal pain, blood in stool and nausea.  Genitourinary:  Negative for dysuria and frequency.  Musculoskeletal:  Negative for falls.  Skin:  Negative for rash.  Neurological:  Negative for dizziness, loss of consciousness and headaches.  Endo/Heme/Allergies:  Negative for environmental allergies.  Psychiatric/Behavioral:  Negative for depression. The patient is not  nervous/anxious.        Objective:    Physical Exam Vitals reviewed.  Constitutional:      Appearance: Normal appearance. He is not ill-appearing.  HENT:     Head: Normocephalic and atraumatic.     Nose: Nose normal.  Eyes:     Conjunctiva/sclera: Conjunctivae normal.  Cardiovascular:     Rate and Rhythm: Normal rate.     Pulses: Normal pulses.     Heart sounds: Normal heart sounds. No murmur heard. Pulmonary:     Effort: Pulmonary effort is normal.     Breath sounds: Wheezing present.     Comments: LUL wheeze Abdominal:     Palpations: Abdomen is soft. There is no mass.     Tenderness: There is no abdominal tenderness.  Musculoskeletal:     Cervical back: Normal range of motion.     Right lower leg: No edema.     Left lower leg: No edema.  Skin:    General: Skin is warm and dry.  Neurological:     General: No focal deficit present.     Mental Status: He is alert and oriented to person, place, and time.  Psychiatric:        Mood and Affect: Mood normal.    BP 120/76   Pulse 63   Temp 98 F (36.7 C)   Resp 16   Ht 5' 8 (1.727 m)   Wt 237 lb 6.4 oz (107.7 kg)   SpO2 96%   BMI 36.10 kg/m  Wt Readings from Last 3 Encounters:  09/19/24 237 lb 6.4 oz (107.7 kg)  08/16/24 245 lb 12.8 oz (111.5 kg)  08/05/24 244 lb 3.2 oz (110.8 kg)    Diabetic Foot Exam - Simple   No data filed    Lab Results  Component Value Date   WBC 5.7 08/16/2024   HGB 14.0 08/16/2024   HCT 40.5 08/16/2024   PLT 219.0 08/16/2024   GLUCOSE 100 (H) 08/16/2024   CHOL 204 (H) 03/25/2024   TRIG 87.0 03/25/2024   HDL 44.10 03/25/2024   LDLCALC 142 (H) 03/25/2024   ALT 40 08/16/2024   AST 26 08/16/2024  NA 135 08/16/2024   K 4.4 08/16/2024   CL 100 08/16/2024   CREATININE 1.01 08/16/2024   BUN 15 08/16/2024   CO2 27 08/16/2024   TSH 1.58 08/16/2024   PSA 0.56 10/21/2023   HGBA1C 5.3 08/16/2024    Lab Results  Component Value Date   TSH 1.58 08/16/2024   Lab Results   Component Value Date   WBC 5.7 08/16/2024   HGB 14.0 08/16/2024   HCT 40.5 08/16/2024   MCV 89.1 08/16/2024   PLT 219.0 08/16/2024   Lab Results  Component Value Date   NA 135 08/16/2024   K 4.4 08/16/2024   CO2 27 08/16/2024   GLUCOSE 100 (H) 08/16/2024   BUN 15 08/16/2024   CREATININE 1.01 08/16/2024   BILITOT 0.6 08/16/2024   ALKPHOS 48 08/16/2024   AST 26 08/16/2024   ALT 40 08/16/2024   PROT 7.1 08/16/2024   ALBUMIN 4.4 08/16/2024   CALCIUM 9.1 08/16/2024   GFR 86.55 08/16/2024   Lab Results  Component Value Date   CHOL 204 (H) 03/25/2024   Lab Results  Component Value Date   HDL 44.10 03/25/2024   Lab Results  Component Value Date   LDLCALC 142 (H) 03/25/2024   Lab Results  Component Value Date   TRIG 87.0 03/25/2024   Lab Results  Component Value Date   CHOLHDL 5 03/25/2024   Lab Results  Component Value Date   HGBA1C 5.3 08/16/2024       Assessment & Plan:  Chronic cough -     DG Chest 2 View; Future -     CBC with Differential/Platelet  Other orders -     Doxycycline  Hyclate; Take 1 tablet (100 mg total) by mouth 2 (two) times daily.  Dispense: 20 tablet; Refill: 0    Assessment and Plan Assessment & Plan Suspected walking pneumonia Symptoms of acute cough, chest congestion, and shortness of breath have persisted for over two weeks with intermittent improvement and worsening. Physical exam reveals a squeak in the left upper lobe, supporting the suspicion of walking pneumonia. No antibiotic allergies, and he has tolerated doxycycline  in the past. - Order chest x-ray - Order CBC to check white blood cell count - Prescribe doxycycline  - Advise on hydration to aid recovery  Acute upper respiratory symptoms Nasal congestion and stuffiness persist with clear mucus production for over two weeks. No thick green mucus or significant nasal discharge noted. - Discuss the role of mucus as a protective mechanism in the respiratory tract  Mild  queasiness and decreased appetite Mild queasiness likely related to erratic eating patterns due to decreased appetite. No significant gastrointestinal symptoms reported. - Advise on maintaining protein and water intake  Recording duration: 13 minutes     Harlene Horton, MD

## 2024-09-19 NOTE — Patient Instructions (Signed)
 Community-Acquired Pneumonia, Adult Pneumonia is a lung infection that causes inflammation and the buildup of mucus and fluids in the lungs. This may cause coughing and difficulty breathing. Community-acquired pneumonia is pneumonia that develops in people who are not, and have not recently been, in a hospital or other health care facility. Usually, pneumonia develops as a result of an illness that is caused by a virus, such as the common cold and the flu (influenza). It can also be caused by bacteria or fungi. While the common cold and influenza can pass from person to person (are contagious), pneumonia itself is not considered contagious. What are the causes? This condition may be caused by: Viruses. Bacteria. Fungi. What increases the risk? The following factors may make you more likely to develop this condition: Being over age 40 or having certain medical conditions, such as: A long-term (chronic) disease, such as: chronic obstructive pulmonary disease (COPD), asthma, heart failure, diabetes, or kidney disease. A condition that increases the risk of breathing in (aspirating) mucus and other fluids from your mouth and nose. A weakened body defense system (immune system). Having had your spleen removed (splenectomy). The spleen is the organ that helps fight germs and infections. Not cleaning your teeth and gums well (poor dental hygiene). Using tobacco products. Traveling to places where germs that cause pneumonia are present or being near certain animals or animal habitats that could have germs that cause pneumonia. What are the signs or symptoms? Symptoms of this condition include: A dry cough or a wet (productive) cough. A fever, sweating, or chills. Chest pain, especially when breathing deeply or coughing. Fast breathing, difficulty breathing, or shortness of breath. Tiredness (fatigue) and muscle aches. How is this diagnosed? This condition may be diagnosed based on your medical  history or a physical exam. You may also have tests, including: Imaging, such as a chest X-ray or lung ultrasound. Tests of: The level of oxygen and other gases in your blood. Mucus from your lungs (sputum). Fluid around your lungs (pleural fluid). Your urine. How is this treated? Treatment for this condition depends on many factors, such as the cause of your pneumonia, your medicines, and other medical conditions that you have. For most adults, pneumonia may be treated at home. In some cases, treatment must happen in a hospital and may include: Medicines that are given by mouth (orally) or through an IV, including: Antibiotic medicines, if bacteria caused the pneumonia. Medicines that kill viruses (antiviral medicines), if a virus caused the pneumonia. Oxygen therapy. Severe pneumonia, although rare, may require the following treatments: Mechanical ventilation.This procedure uses a machine to help you breathe if you cannot breathe well on your own or maintain a safe level of blood oxygen. Thoracentesis. This procedure removes any buildup of pleural fluid to help with breathing. Follow these instructions at home:  Medicines Take over-the-counter and prescription medicines only as told by your health care provider. Take cough medicine only if you have trouble sleeping. Cough medicine can prevent your body from removing mucus from your lungs. If you were prescribed antibiotics, take them as told by your health care provider. Do not stop taking the antibiotic even if you start to feel better. Lifestyle     Do not drink alcohol. Do not use any products that contain nicotine or tobacco. These products include cigarettes, chewing tobacco, and vaping devices, such as e-cigarettes. If you need help quitting, ask your health care provider. Eat a healthy diet. This includes plenty of vegetables, fruits, whole grains, low-fat  dairy products, and lean protein. General instructions Rest a lot and  get at least 8 hours of sleep each night. Sleep in a partly upright position at night. Place a few pillows under your head or sleep in a reclining chair. Return to your normal activities as told by your health care provider. Ask your health care provider what activities are safe for you. Drink enough fluid to keep your urine pale yellow. This helps to thin the mucus in your lungs. If your throat is sore, gargle with a mixture of salt and water 3-4 times a day or as needed. To make salt water, completely dissolve -1 tsp (3-6 g) of salt in 1 cup (237 mL) of warm water. Keep all follow-up visits. How is this prevented? You can lower your risk of developing community-acquired pneumonia by: Getting the pneumonia vaccine. There are different types and schedules of pneumonia vaccines. Ask your health care provider which option is best for you. Consider getting the pneumonia vaccine if: You are older than 51 years of age. You are 67-15 years of age and are receiving cancer treatment, have chronic lung disease, or have other medical conditions that affect your immune system. Ask your health care provider if this applies to you. Getting your influenza vaccine every year. Ask your health care provider which type of vaccine is best for you. Getting regular dental checkups. Washing your hands often with soap and water for at least 20 seconds. If soap and water are not available, use hand sanitizer. Contact a health care provider if: You have a fever. You have trouble sleeping because you cannot control your cough with cough medicine. Get help right away if: Your shortness of breath becomes worse. Your chest pain increases. Your sickness becomes worse, especially if you are an older adult or have a weak immune system. You cough up blood. These symptoms may be an emergency. Get help right away. Call 911. Do not wait to see if the symptoms will go away. Do not drive yourself to the  hospital. Summary Pneumonia is an infection of the lungs. Community-acquired pneumonia develops in people who have not been in the hospital. It can be caused by bacteria, viruses, or fungi. This condition may be treated with antibiotics or antiviral medicines. Severe pneumonia may require a hospital stay and treatment to help with breathing. This information is not intended to replace advice given to you by your health care provider. Make sure you discuss any questions you have with your health care provider. Document Revised: 11/18/2023 Document Reviewed: 02/12/2022 Elsevier Patient Education  2025 [employer].

## 2024-09-20 NOTE — Progress Notes (Signed)
 Patient reviewed via MyChart.

## 2024-09-21 DIAGNOSIS — M51372 Other intervertebral disc degeneration, lumbosacral region with discogenic back pain and lower extremity pain: Secondary | ICD-10-CM | POA: Diagnosis not present

## 2024-09-21 DIAGNOSIS — M609 Myositis, unspecified: Secondary | ICD-10-CM | POA: Diagnosis not present

## 2024-09-21 DIAGNOSIS — M9902 Segmental and somatic dysfunction of thoracic region: Secondary | ICD-10-CM | POA: Diagnosis not present

## 2024-09-21 DIAGNOSIS — M9903 Segmental and somatic dysfunction of lumbar region: Secondary | ICD-10-CM | POA: Diagnosis not present

## 2024-09-26 DIAGNOSIS — M9903 Segmental and somatic dysfunction of lumbar region: Secondary | ICD-10-CM | POA: Diagnosis not present

## 2024-09-26 DIAGNOSIS — M609 Myositis, unspecified: Secondary | ICD-10-CM | POA: Diagnosis not present

## 2024-09-26 DIAGNOSIS — M9902 Segmental and somatic dysfunction of thoracic region: Secondary | ICD-10-CM | POA: Diagnosis not present

## 2024-09-28 DIAGNOSIS — M9903 Segmental and somatic dysfunction of lumbar region: Secondary | ICD-10-CM | POA: Diagnosis not present

## 2024-09-28 DIAGNOSIS — M609 Myositis, unspecified: Secondary | ICD-10-CM | POA: Diagnosis not present

## 2024-09-28 DIAGNOSIS — M51372 Other intervertebral disc degeneration, lumbosacral region with discogenic back pain and lower extremity pain: Secondary | ICD-10-CM | POA: Diagnosis not present

## 2024-09-28 DIAGNOSIS — M9902 Segmental and somatic dysfunction of thoracic region: Secondary | ICD-10-CM | POA: Diagnosis not present

## 2024-09-29 DIAGNOSIS — M65321 Trigger finger, right index finger: Secondary | ICD-10-CM | POA: Diagnosis not present

## 2024-10-11 ENCOUNTER — Telehealth: Payer: Self-pay

## 2024-10-11 NOTE — Telephone Encounter (Signed)
 To call 2 weeks ahead of the physical exam to get orders

## 2024-10-11 NOTE — Telephone Encounter (Signed)
 Copied from CRM #[id number]. Topic: Clinical - Request for Lab/Test Order >> Oct 11, 2024 12:58 PM Harlene ORN wrote: Reason for CRM: Patient is requesting lab work orders so he can have labs done before his physical on 01/28.  Message via MyChart

## 2024-10-20 DIAGNOSIS — M65321 Trigger finger, right index finger: Secondary | ICD-10-CM | POA: Diagnosis not present

## 2024-10-25 ENCOUNTER — Encounter (HOSPITAL_COMMUNITY)
Admission: EM | Disposition: A | Discharge status: Home / Self Care | Payer: Self-pay | Source: Home / Self Care | Attending: Internal Medicine

## 2024-10-25 ENCOUNTER — Inpatient Hospital Stay (HOSPITAL_BASED_OUTPATIENT_CLINIC_OR_DEPARTMENT_OTHER)
Admission: EM | Admit: 2024-10-25 | Discharge: 2024-10-28 | DRG: 857 | Disposition: A | Discharge status: Home / Self Care | Attending: Internal Medicine | Admitting: Internal Medicine

## 2024-10-25 ENCOUNTER — Inpatient Hospital Stay (HOSPITAL_COMMUNITY): Admitting: Certified Registered Nurse Anesthetist

## 2024-10-25 ENCOUNTER — Other Ambulatory Visit: Payer: Self-pay

## 2024-10-25 ENCOUNTER — Encounter (HOSPITAL_BASED_OUTPATIENT_CLINIC_OR_DEPARTMENT_OTHER): Payer: Self-pay

## 2024-10-25 ENCOUNTER — Inpatient Hospital Stay: Admit: 2024-10-25 | Admitting: Orthopedic Surgery

## 2024-10-25 ENCOUNTER — Emergency Department (HOSPITAL_BASED_OUTPATIENT_CLINIC_OR_DEPARTMENT_OTHER)

## 2024-10-25 DIAGNOSIS — M6530 Trigger finger, unspecified finger: Secondary | ICD-10-CM | POA: Diagnosis not present

## 2024-10-25 DIAGNOSIS — B958 Unspecified staphylococcus as the cause of diseases classified elsewhere: Secondary | ICD-10-CM | POA: Diagnosis not present

## 2024-10-25 DIAGNOSIS — R110 Nausea: Secondary | ICD-10-CM | POA: Diagnosis not present

## 2024-10-25 DIAGNOSIS — A4901 Methicillin susceptible Staphylococcus aureus infection, unspecified site: Secondary | ICD-10-CM

## 2024-10-25 DIAGNOSIS — T8149XA Infection following a procedure, other surgical site, initial encounter: Principal | ICD-10-CM

## 2024-10-25 DIAGNOSIS — M6510 Other infective (teno)synovitis, unspecified site: Secondary | ICD-10-CM

## 2024-10-25 DIAGNOSIS — E669 Obesity, unspecified: Secondary | ICD-10-CM | POA: Diagnosis not present

## 2024-10-25 DIAGNOSIS — R509 Fever, unspecified: Secondary | ICD-10-CM | POA: Diagnosis not present

## 2024-10-25 DIAGNOSIS — L089 Local infection of the skin and subcutaneous tissue, unspecified: Secondary | ICD-10-CM | POA: Diagnosis not present

## 2024-10-25 DIAGNOSIS — Z833 Family history of diabetes mellitus: Secondary | ICD-10-CM | POA: Diagnosis not present

## 2024-10-25 DIAGNOSIS — F32A Depression, unspecified: Secondary | ICD-10-CM | POA: Diagnosis not present

## 2024-10-25 DIAGNOSIS — Z91011 Allergy to milk products, unspecified: Secondary | ICD-10-CM | POA: Diagnosis not present

## 2024-10-25 DIAGNOSIS — Y838 Other surgical procedures as the cause of abnormal reaction of the patient, or of later complication, without mention of misadventure at the time of the procedure: Secondary | ICD-10-CM | POA: Diagnosis not present

## 2024-10-25 DIAGNOSIS — Z811 Family history of alcohol abuse and dependence: Secondary | ICD-10-CM | POA: Diagnosis not present

## 2024-10-25 DIAGNOSIS — Z860100 Personal history of colon polyps, unspecified: Secondary | ICD-10-CM

## 2024-10-25 DIAGNOSIS — T8140XA Infection following a procedure, unspecified, initial encounter: Secondary | ICD-10-CM | POA: Diagnosis present

## 2024-10-25 DIAGNOSIS — Z79899 Other long term (current) drug therapy: Secondary | ICD-10-CM

## 2024-10-25 DIAGNOSIS — G4733 Obstructive sleep apnea (adult) (pediatric): Secondary | ICD-10-CM | POA: Diagnosis present

## 2024-10-25 DIAGNOSIS — T8142XA Infection following a procedure, deep incisional surgical site, initial encounter: Secondary | ICD-10-CM | POA: Diagnosis not present

## 2024-10-25 DIAGNOSIS — J8281 Chronic eosinophilic pneumonia: Secondary | ICD-10-CM | POA: Diagnosis present

## 2024-10-25 DIAGNOSIS — Z822 Family history of deafness and hearing loss: Secondary | ICD-10-CM | POA: Diagnosis not present

## 2024-10-25 DIAGNOSIS — Z1639 Resistance to other specified antimicrobial drug: Secondary | ICD-10-CM | POA: Diagnosis not present

## 2024-10-25 DIAGNOSIS — B9561 Methicillin susceptible Staphylococcus aureus infection as the cause of diseases classified elsewhere: Secondary | ICD-10-CM | POA: Diagnosis not present

## 2024-10-25 DIAGNOSIS — K219 Gastro-esophageal reflux disease without esophagitis: Secondary | ICD-10-CM | POA: Diagnosis present

## 2024-10-25 DIAGNOSIS — Z8249 Family history of ischemic heart disease and other diseases of the circulatory system: Secondary | ICD-10-CM | POA: Diagnosis not present

## 2024-10-25 DIAGNOSIS — M65841 Other synovitis and tenosynovitis, right hand: Secondary | ICD-10-CM | POA: Diagnosis present

## 2024-10-25 DIAGNOSIS — Z6836 Body mass index (BMI) 36.0-36.9, adult: Secondary | ICD-10-CM

## 2024-10-25 DIAGNOSIS — Z87891 Personal history of nicotine dependence: Secondary | ICD-10-CM

## 2024-10-25 DIAGNOSIS — T8141XA Infection following a procedure, superficial incisional surgical site, initial encounter: Principal | ICD-10-CM | POA: Diagnosis present

## 2024-10-25 DIAGNOSIS — Z8261 Family history of arthritis: Secondary | ICD-10-CM | POA: Diagnosis not present

## 2024-10-25 DIAGNOSIS — T8149XD Infection following a procedure, other surgical site, subsequent encounter: Secondary | ICD-10-CM | POA: Diagnosis not present

## 2024-10-25 DIAGNOSIS — Z818 Family history of other mental and behavioral disorders: Secondary | ICD-10-CM | POA: Diagnosis not present

## 2024-10-25 DIAGNOSIS — Z5181 Encounter for therapeutic drug level monitoring: Secondary | ICD-10-CM | POA: Diagnosis not present

## 2024-10-25 DIAGNOSIS — Z825 Family history of asthma and other chronic lower respiratory diseases: Secondary | ICD-10-CM | POA: Diagnosis not present

## 2024-10-25 HISTORY — PX: INCISION AND DRAINAGE OF WOUND: SHX1803

## 2024-10-25 LAB — COMPREHENSIVE METABOLIC PANEL WITH GFR
ALT: 45 U/L — ABNORMAL HIGH (ref 0–44)
AST: 29 U/L (ref 15–41)
Albumin: 4.2 g/dL (ref 3.5–5.0)
Alkaline Phosphatase: 60 U/L (ref 38–126)
Anion gap: 13 (ref 5–15)
BUN: 11 mg/dL (ref 6–20)
CO2: 24 mmol/L (ref 22–32)
Calcium: 9.2 mg/dL (ref 8.9–10.3)
Chloride: 100 mmol/L (ref 98–111)
Creatinine, Ser: 1.01 mg/dL (ref 0.61–1.24)
GFR, Estimated: >60 mL/min (ref >60)
Glucose, Bld: 130 mg/dL — ABNORMAL HIGH (ref 70–99)
Potassium: 3.9 mmol/L (ref 3.5–5.1)
Sodium: 137 mmol/L (ref 135–145)
Total Bilirubin: 0.9 mg/dL (ref 0.0–1.2)
Total Protein: 7.3 g/dL (ref 6.5–8.1)

## 2024-10-25 LAB — CBC WITH DIFFERENTIAL/PLATELET
Abs Immature Granulocytes: 0.03 K/uL (ref 0.00–0.07)
Basophils Absolute: 0 K/uL (ref 0.0–0.1)
Basophils Relative: 0 %
Eosinophils Absolute: 0 K/uL (ref 0.0–0.5)
Eosinophils Relative: 0 %
HCT: 37.9 % — ABNORMAL LOW (ref 39.0–52.0)
Hemoglobin: 13.6 g/dL (ref 13.0–17.0)
Immature Granulocytes: 0 %
Lymphocytes Relative: 14 %
Lymphs Abs: 1.6 K/uL (ref 0.7–4.0)
MCH: 31.3 pg (ref 26.0–34.0)
MCHC: 35.9 g/dL (ref 30.0–36.0)
MCV: 87.3 fL (ref 80.0–100.0)
Monocytes Absolute: 1.6 K/uL — ABNORMAL HIGH (ref 0.1–1.0)
Monocytes Relative: 15 %
Neutro Abs: 7.8 K/uL — ABNORMAL HIGH (ref 1.7–7.7)
Neutrophils Relative %: 71 %
Platelets: 213 K/uL (ref 150–400)
RBC: 4.34 MIL/uL (ref 4.22–5.81)
RDW: 12.7 % (ref 11.5–15.5)
WBC: 11 K/uL — ABNORMAL HIGH (ref 4.0–10.5)
nRBC: 0 % (ref 0.0–0.2)

## 2024-10-25 LAB — LACTIC ACID, PLASMA: Lactic Acid, Venous: 1 mmol/L (ref 0.5–1.9)

## 2024-10-25 LAB — GLUCOSE, CAPILLARY: Glucose-Capillary: 90 mg/dL (ref 70–99)

## 2024-10-25 LAB — SURGICAL PCR SCREEN
MRSA, PCR: NEGATIVE
Staphylococcus aureus: POSITIVE — AB

## 2024-10-25 LAB — HIV ANTIBODY (ROUTINE TESTING W REFLEX): HIV Screen 4th Generation wRfx: NONREACTIVE

## 2024-10-25 SURGERY — IRRIGATION AND DEBRIDEMENT WOUND
Anesthesia: General | Laterality: Right

## 2024-10-25 MED ORDER — SODIUM CHLORIDE 0.9 % IV SOLN
2.0000 g | Freq: Three times a day (TID) | INTRAVENOUS | Status: DC
  Administered 2024-10-25 – 2024-10-26 (×3): 2 g via INTRAVENOUS
  Filled 2024-10-25 (×3): qty 12.5

## 2024-10-25 MED ORDER — LACTATED RINGERS IV SOLN: INTRAVENOUS | Status: AC

## 2024-10-25 MED ORDER — PROPOFOL 10 MG/ML IV BOLUS
INTRAVENOUS | Status: AC
Start: 2024-10-25 — End: 2024-10-25
  Filled 2024-10-25: qty 20

## 2024-10-25 MED ORDER — BUPIVACAINE HCL (PF) 0.5 % IJ SOLN
INTRAMUSCULAR | Status: AC
  Filled 2024-10-25: qty 30

## 2024-10-25 MED ORDER — PANTOPRAZOLE SODIUM 40 MG PO TBEC
40.0000 mg | DELAYED_RELEASE_TABLET | Freq: Two times a day (BID) | ORAL | Status: DC
  Administered 2024-10-25 – 2024-10-28 (×6): 40 mg via ORAL
  Filled 2024-10-25 (×6): qty 1

## 2024-10-25 MED ORDER — PROPOFOL 500 MG/50ML IV EMUL
INTRAVENOUS | Status: DC | PRN
  Administered 2024-10-25: 50 ug/kg/min via INTRAVENOUS

## 2024-10-25 MED ORDER — FENTANYL CITRATE (PF) 100 MCG/2ML IJ SOLN
25.0000 ug | INTRAMUSCULAR | Status: DC | PRN
  Administered 2024-10-25 (×2): 50 ug via INTRAVENOUS

## 2024-10-25 MED ORDER — VANCOMYCIN HCL IN DEXTROSE 1-5 GM/200ML-% IV SOLN
1000.0000 mg | Freq: Two times a day (BID) | INTRAVENOUS | Status: DC
  Administered 2024-10-25 – 2024-10-26 (×2): 1000 mg via INTRAVENOUS
  Filled 2024-10-25: qty 200

## 2024-10-25 MED ORDER — DEXAMETHASONE SOD PHOSPHATE PF 10 MG/ML IJ SOLN
INTRAMUSCULAR | Status: DC | PRN
  Administered 2024-10-25: 10 mg via INTRAVENOUS

## 2024-10-25 MED ORDER — ONDANSETRON HCL 4 MG/2ML IJ SOLN
INTRAMUSCULAR | Status: AC
  Filled 2024-10-25: qty 6

## 2024-10-25 MED ORDER — LACTATED RINGERS IV SOLN: INTRAVENOUS | Status: DC

## 2024-10-25 MED ORDER — BACITRACIN ZINC 500 UNIT/GM EX OINT
TOPICAL_OINTMENT | CUTANEOUS | Status: AC
  Filled 2024-10-25: qty 28.35

## 2024-10-25 MED ORDER — FAMOTIDINE 20 MG PO TABS
20.0000 mg | ORAL_TABLET | Freq: Two times a day (BID) | ORAL | Status: DC
  Administered 2024-10-25 – 2024-10-28 (×6): 20 mg via ORAL
  Filled 2024-10-25 (×6): qty 1

## 2024-10-25 MED ORDER — OXYCODONE HCL 5 MG/5ML PO SOLN: 5.0000 mg | Freq: Once | ORAL | Status: AC | PRN

## 2024-10-25 MED ORDER — ONDANSETRON HCL 4 MG/2ML IJ SOLN
INTRAMUSCULAR | Status: DC | PRN
  Administered 2024-10-25: 4 mg via INTRAVENOUS

## 2024-10-25 MED ORDER — ORAL CARE MOUTH RINSE: 15.0000 mL | Freq: Once | OROMUCOSAL | Status: AC

## 2024-10-25 MED ORDER — OXYCODONE HCL 5 MG PO TABS: 5.0000 mg | ORAL_TABLET | ORAL | Status: DC | PRN

## 2024-10-25 MED ORDER — BUPIVACAINE HCL (PF) 0.25 % IJ SOLN
INTRAMUSCULAR | Status: DC | PRN
  Administered 2024-10-25: 6 mL

## 2024-10-25 MED ORDER — ACETAMINOPHEN 10 MG/ML IV SOLN
1000.0000 mg | Freq: Once | INTRAVENOUS | Status: DC | PRN
  Administered 2024-10-25: 1000 mg via INTRAVENOUS

## 2024-10-25 MED ORDER — LIDOCAINE 2% (20 MG/ML) 5 ML SYRINGE
INTRAMUSCULAR | Status: AC
Start: 2024-10-25 — End: 2024-10-25
  Filled 2024-10-25: qty 15

## 2024-10-25 MED ORDER — FENTANYL CITRATE (PF) 250 MCG/5ML IJ SOLN
INTRAMUSCULAR | Status: DC | PRN
  Administered 2024-10-25: 150 ug via INTRAVENOUS
  Administered 2024-10-25 (×2): 50 ug via INTRAVENOUS

## 2024-10-25 MED ORDER — OXYCODONE HCL 5 MG PO TABS
5.0000 mg | ORAL_TABLET | Freq: Once | ORAL | Status: AC | PRN
  Administered 2024-10-25: 5 mg via ORAL

## 2024-10-25 MED ORDER — OXYCODONE HCL 5 MG PO TABS
ORAL_TABLET | ORAL | Status: AC
  Filled 2024-10-25: qty 1

## 2024-10-25 MED ORDER — CHLORHEXIDINE GLUCONATE 0.12 % MT SOLN
OROMUCOSAL | Status: AC
Start: 2024-10-25 — End: 2024-10-25
  Administered 2024-10-25: 15 mL via OROMUCOSAL
  Filled 2024-10-25: qty 15

## 2024-10-25 MED ORDER — FENTANYL CITRATE (PF) 100 MCG/2ML IJ SOLN
INTRAMUSCULAR | Status: AC
  Filled 2024-10-25: qty 2

## 2024-10-25 MED ORDER — EPHEDRINE 5 MG/ML INJ
INTRAVENOUS | Status: AC
  Filled 2024-10-25: qty 5

## 2024-10-25 MED ORDER — FENTANYL CITRATE (PF) 250 MCG/5ML IJ SOLN
INTRAMUSCULAR | Status: AC
  Filled 2024-10-25: qty 5

## 2024-10-25 MED ORDER — OXYCODONE HCL 5 MG PO TABS
5.0000 mg | ORAL_TABLET | Freq: Four times a day (QID) | ORAL | Status: DC | PRN
  Administered 2024-10-25: 5 mg via ORAL
  Filled 2024-10-25: qty 1

## 2024-10-25 MED ORDER — ROCURONIUM BROMIDE 10 MG/ML (PF) SYRINGE
PREFILLED_SYRINGE | INTRAVENOUS | Status: AC
  Filled 2024-10-25: qty 20

## 2024-10-25 MED ORDER — MORPHINE SULFATE (PF) 2 MG/ML IV SOLN
2.0000 mg | INTRAVENOUS | Status: DC | PRN
  Administered 2024-10-25 – 2024-10-26 (×3): 2 mg via INTRAVENOUS
  Filled 2024-10-25 (×4): qty 1

## 2024-10-25 MED ORDER — PHENYLEPHRINE 80 MCG/ML (10ML) SYRINGE FOR IV PUSH (FOR BLOOD PRESSURE SUPPORT)
PREFILLED_SYRINGE | INTRAVENOUS | Status: AC
  Filled 2024-10-25: qty 10

## 2024-10-25 MED ORDER — ONDANSETRON HCL 4 MG/2ML IJ SOLN
4.0000 mg | Freq: Four times a day (QID) | INTRAMUSCULAR | Status: DC | PRN
  Administered 2024-10-25: 4 mg via INTRAVENOUS
  Filled 2024-10-25: qty 2

## 2024-10-25 MED ORDER — LIDOCAINE 2% (20 MG/ML) 5 ML SYRINGE
INTRAMUSCULAR | Status: DC | PRN
  Administered 2024-10-25: 60 mg via INTRAVENOUS

## 2024-10-25 MED ORDER — SODIUM CHLORIDE 0.9 % IR SOLN
Status: DC | PRN
  Administered 2024-10-25: 1

## 2024-10-25 MED ORDER — ACETAMINOPHEN 10 MG/ML IV SOLN
INTRAVENOUS | Status: AC
Start: 2024-10-25 — End: 2024-10-25
  Filled 2024-10-25: qty 100

## 2024-10-25 MED ORDER — CHLORHEXIDINE GLUCONATE 0.12 % MT SOLN: 15.0000 mL | Freq: Once | OROMUCOSAL | Status: AC

## 2024-10-25 MED ORDER — CEFAZOLIN SODIUM-DEXTROSE 2-4 GM/100ML-% IV SOLN
2.0000 g | INTRAVENOUS | Status: AC
  Administered 2024-10-25: 2 g via INTRAVENOUS
  Filled 2024-10-25: qty 100

## 2024-10-25 MED ORDER — MELATONIN 5 MG PO TABS
5.0000 mg | ORAL_TABLET | Freq: Every day | ORAL | Status: DC
  Administered 2024-10-25 – 2024-10-27 (×3): 5 mg via ORAL
  Filled 2024-10-25 (×3): qty 1

## 2024-10-25 MED ORDER — VANCOMYCIN HCL IN DEXTROSE 1-5 GM/200ML-% IV SOLN
1000.0000 mg | INTRAVENOUS | Status: AC
  Administered 2024-10-25 (×2): 1000 mg via INTRAVENOUS
  Filled 2024-10-25 (×2): qty 200

## 2024-10-25 MED ORDER — MORPHINE SULFATE (PF) 4 MG/ML IV SOLN
4.0000 mg | INTRAVENOUS | Status: DC | PRN
  Administered 2024-10-25 (×2): 4 mg via INTRAVENOUS
  Filled 2024-10-25 (×2): qty 1

## 2024-10-25 MED ORDER — PROPOFOL 10 MG/ML IV BOLUS
INTRAVENOUS | Status: DC | PRN
  Administered 2024-10-25: 200 mg via INTRAVENOUS

## 2024-10-25 SURGICAL SUPPLY — 49 items
BAG COUNTER SPONGE SURGICOUNT (BAG) ×1 IMPLANT
BNDG COHESIVE 1X5 TAN STRL LF (GAUZE/BANDAGES/DRESSINGS) IMPLANT
BNDG COMPR ESMARK 4X3 LF (GAUZE/BANDAGES/DRESSINGS) ×1 IMPLANT
BNDG ELASTIC 3INX 5YD STR LF (GAUZE/BANDAGES/DRESSINGS) ×1 IMPLANT
BNDG ELASTIC 4X5.8 VLCR STR LF (GAUZE/BANDAGES/DRESSINGS) ×1 IMPLANT
BNDG GAUZE DERMACEA FLUFF 4 (GAUZE/BANDAGES/DRESSINGS) ×1 IMPLANT
CORD BIPOLAR FORCEPS 12FT (ELECTRODE) ×1 IMPLANT
COVER SURGICAL LIGHT HANDLE (MISCELLANEOUS) ×1 IMPLANT
CUFF TOURN SGL QUICK 18X4 (TOURNIQUET CUFF) ×1 IMPLANT
CUFF TRNQT CYL 24X4X16.5-23 (TOURNIQUET CUFF) IMPLANT
DRAIN PENROSE 12X.25 LTX STRL (MISCELLANEOUS) IMPLANT
DRAPE SURG 17X23 STRL (DRAPES) ×1 IMPLANT
DRSG ADAPTIC 3X8 NADH LF (GAUZE/BANDAGES/DRESSINGS) ×1 IMPLANT
DRSG EMULSION OIL 3X3 NADH (GAUZE/BANDAGES/DRESSINGS) IMPLANT
ELECTRODE REM PT RTRN 9FT ADLT (ELECTROSURGICAL) IMPLANT
GAUZE SPONGE 4X4 12PLY STRL (GAUZE/BANDAGES/DRESSINGS) ×1 IMPLANT
GAUZE STRETCH 2X75IN STRL (MISCELLANEOUS) IMPLANT
GAUZE XEROFORM 1X8 LF (GAUZE/BANDAGES/DRESSINGS) ×1 IMPLANT
GAUZE XEROFORM 5X9 LF (GAUZE/BANDAGES/DRESSINGS) IMPLANT
GLOVE BIO SURGEON STRL SZ7.5 (GLOVE) ×1 IMPLANT
GLOVE BIOGEL PI IND STRL 7.5 (GLOVE) ×1 IMPLANT
GOWN STRL REUS W/ TWL LRG LVL3 (GOWN DISPOSABLE) ×3 IMPLANT
GOWN STRL REUS W/ TWL XL LVL3 (GOWN DISPOSABLE) ×1 IMPLANT
KIT BASIN OR (CUSTOM PROCEDURE TRAY) ×1 IMPLANT
KIT TURNOVER KIT B (KITS) ×1 IMPLANT
MANIFOLD NEPTUNE II (INSTRUMENTS) ×1 IMPLANT
NDL HYPO 25GX1X1/2 BEV (NEEDLE) IMPLANT
NEEDLE HYPO 25GX1X1/2 BEV (NEEDLE) IMPLANT
PACK ORTHO EXTREMITY (CUSTOM PROCEDURE TRAY) ×1 IMPLANT
PAD ARMBOARD POSITIONER FOAM (MISCELLANEOUS) ×2 IMPLANT
PAD CAST 4YDX4 CTTN HI CHSV (CAST SUPPLIES) ×1 IMPLANT
PADDING CAST ABS COTTON 3X4 (CAST SUPPLIES) IMPLANT
SET CYSTO IRRIGATION (SET/KITS/TRAYS/PACK) IMPLANT
SET HNDPC FAN SPRY TIP SCT (DISPOSABLE) IMPLANT
SOAP 2 % CHG 4 OZ (WOUND CARE) ×1 IMPLANT
SOLN 0.9% NACL POUR BTL 1000ML (IV SOLUTION) ×1 IMPLANT
SOLN STERILE WATER BTL 1000 ML (IV SOLUTION) ×1 IMPLANT
SPONGE T-LAP 18X18 ~~LOC~~+RFID (SPONGE) ×1 IMPLANT
SPONGE T-LAP 4X18 ~~LOC~~+RFID (SPONGE) ×1 IMPLANT
SUT ETHILON 4 0 PS 2 18 (SUTURE) IMPLANT
SUT NYLON ETHILON 5-0 P-3 1X18 (SUTURE) IMPLANT
SWAB COLLECTION DEVICE MRSA (MISCELLANEOUS) ×1 IMPLANT
SWAB CULTURE ESWAB REG 1ML (MISCELLANEOUS) IMPLANT
SYR CONTROL 10ML LL (SYRINGE) IMPLANT
TOWEL GREEN STERILE (TOWEL DISPOSABLE) ×1 IMPLANT
TOWEL GREEN STERILE FF (TOWEL DISPOSABLE) ×1 IMPLANT
TUBE CONNECTING 12X1/4 (SUCTIONS) ×1 IMPLANT
UNDERPAD 30X36 HEAVY ABSORB (UNDERPADS AND DIAPERS) ×1 IMPLANT
YANKAUER SUCT BULB TIP NO VENT (SUCTIONS) ×1 IMPLANT

## 2024-10-25 NOTE — Plan of Care (Signed)
  Problem: Health Behavior/Discharge Planning: Goal: Ability to manage health-related needs will improve Outcome: Progressing   Problem: Clinical Measurements: Goal: Cardiovascular complication will be avoided Outcome: Progressing   Problem: Activity: Goal: Risk for activity intolerance will decrease Outcome: Progressing   

## 2024-10-25 NOTE — Progress Notes (Signed)
 Pharmacy Antibiotic Note  Scott Phillips is a 53 y.o. male admitted on 10/25/2024 with finger infection.  Pharmacy has been consulted for vanc/cefepime dosing.  Pt admitted for finger infection. S/p I&D. Empiric with vanc/cefepime pending ID consult.   Scr 1  Plan: Vanc 2g x1 then 1g IV q12>>AUC 487, scr 1 Cefepime 2g IV q8 Levels if needed  Height: 5' 8 (172.7 cm) Weight: 108.9 kg (240 lb) IBW/kg (Calculated) : 68.4  Temp (24hrs), Avg:98.7 F (37.1 C), Min:98.1 F (36.7 C), Max:99.2 F (37.3 C)  Recent Labs  Lab 10/25/24 0202  WBC 11.0*  CREATININE 1.01  LATICACIDVEN 1.0    Estimated Creatinine Clearance: 104.7 mL/min (by C-G formula based on SCr of 1.01 mg/dL).    Allergies  Allergen Reactions   Milk-Related Compounds Other (See Comments)    Sinus issues- congestion     Antimicrobials this admission: 10/28 cefaz x1 10/28 cefepime>> 10/28 vanc>>  Dose adjustments this admission:   Microbiology results: 10/28 wound>> 10/28 MRSA neg  Sergio Batch, PharmD, BCIDP, AAHIVP, CPP Infectious Disease Pharmacist 10/25/2024 4:59 PM

## 2024-10-25 NOTE — ED Triage Notes (Signed)
 Patient reports having surgery for trigger finger on 10/20/24, patient followed up with surgon on 10/24/24 with concerns for possible infection. Patient was prescribed Doxycycline .   Patient reports this evening patient began having a fever of 101.4 at home and right 2nd digit became more swollen.   Patient presents with noted swelling and swelling.

## 2024-10-25 NOTE — H&P (Signed)
 Preoperative History & Physical Exam  Surgeon: Lynwood Robynn Rias, MD  Diagnosis: Infection to right index finger  Planned Procedure: Irrigation and debridement right index finger  History of Present Illness:   Patient is a 53 y.o. male with symptoms consistent with infection to right index finger who presents for surgical intervention. He underwent right index A1 pulley release on 10/20/2024 and developed significant pain on 10/22/2024. He was seen in the office on 10/24/2024 and started on oral antibiotics. His pain and swelling progressed and he presented to the emergency department overnight. He reported a fever at home of 101 F, afebrile in the ED. He has a mildly elevated WBC. The risks, benefits and alternatives of surgical intervention were discussed and informed consent was obtained prior to surgery.  Past Medical History:  Past Medical History:  Diagnosis Date   Allergic rhinosinusitis    Allergy    Arthritis    Colon polyps    Depression    Diverticulitis    GERD (gastroesophageal reflux disease)    Sinusitis    Sleep apnea     Past Surgical History:  Past Surgical History:  Procedure Laterality Date   CARPAL TUNNEL RELEASE     KNEE ARTHROSCOPY Right 1991   cyst removal   MAXILLARY ANTROSTOMY Right 06/29/2020   Procedure: RIGHT MAXILLARY ANTROSTOMY WITH TISSUE REMOVAL;  Surgeon: Karis Clunes, MD;  Location: [facility];  Service: ENT;  Laterality: Right;   NASAL SINUS SURGERY     x3 in [place]   SUBACROMIAL DECOMPRESSION Left    TRIGGER FINGER RELEASE Right 2018   R 3rd   VASECTOMY  2019    Medications:  Prior to Admission medications   Medication Sig Start Date End Date Taking? Authorizing Provider  b complex vitamins tablet Take 1 tablet by mouth 3 (three) times a week.    [provider]  Calcium-Magnesium-Zinc (CALCIUM-MAGNESUIUM-ZINC PO) Take by mouth 2 (two) times daily.    [provider]  doxycycline  (VIBRA -TABS) 100 MG  tablet Take 1 tablet (100 mg total) by mouth 2 (two) times daily. 09/19/24   Domenica Harlene LABOR, MD  famotidine  (PEPCID ) 20 MG tablet Take 1 tablet (20 mg total) by mouth 2 (two) times daily before a meal. 07/10/20   Paz, Jose E, MD  HYDROcodone-acetaminophen  (NORCO/VICODIN) 5-325 MG tablet Take 1 tablet by mouth every 6 (six) hours.    [provider]  melatonin 5 MG TABS Take 5 mg by mouth at bedtime.    [provider]  METAMUCIL FIBER PO Take by mouth.    [provider]  Misc Natural Products (TURMERIC CURCUMIN) CAPS Take 1,000 each by mouth.    [provider]  Multiple Vitamin (MULTIVITAMIN ADULT PO) Take by mouth.    [provider]  oxyCODONE (OXY IR/ROXICODONE) 5 MG immediate release tablet Take 1 tablet every 6 hours by oral route, for Pain. 10/24/24   [provider]  pantoprazole  (PROTONIX ) 40 MG tablet Take 1 tablet (40 mg total) by mouth 2 (two) times daily before a meal. 03/18/24   Amon Aloysius BRAVO, MD  Probiotic Product (PROBIOTIC-10 PO) Take by mouth.    [provider]  promethazine -dextromethorphan (PROMETHAZINE -DM) 6.25-15 MG/5ML syrup Take 5 mLs by mouth at bedtime as needed for cough. 09/11/24   Enedelia Dorna HERO, FNP    Allergies:  Milk-related compounds  Review of Systems: Negative except per HPI.  Physical Exam: Alert and oriented, NAD Head and neck: no masses, normal  alignment CV: pulse intact Pulm: no increased work of breathing, respirations even and unlabored Abdomen: non-distended Extremities: extremities warm and well perfused Right index finger reveals surgical incision with erythema, fluctuance, significant tenderness extending along the volar finger, limited flexion, sensation intact to light touch, finger warm and well perfused  LABS: Recent Results (from the past 2160 hours)  CBC w/Diff     Status: Abnormal   Collection Time: 08/16/24  4:55 PM  Result Value Ref Range   WBC 5.7 4.0 - 10.5 K/uL    RBC 4.54 4.22 - 5.81 Mil/uL   Hemoglobin 14.0 13.0 - 17.0 g/dL   HCT 59.4 60.9 - 47.9 %   MCV 89.1 78.0 - 100.0 fl   MCHC 34.6 30.0 - 36.0 g/dL   RDW 86.8 88.4 - 84.4 %   Platelets 219.0 150.0 - 400.0 K/uL   Neutrophils Relative % 51.8 43.0 - 77.0 %   Lymphocytes Relative 30.9 12.0 - 46.0 %   Monocytes Relative 15.4 (H) 3.0 - 12.0 %   Eosinophils Relative 1.3 0.0 - 5.0 %   Basophils Relative 0.6 0.0 - 3.0 %   Neutro Abs 2.9 1.4 - 7.7 K/uL   Lymphs Abs 1.8 0.7 - 4.0 K/uL   Monocytes Absolute 0.9 0.1 - 1.0 K/uL   Eosinophils Absolute 0.1 0.0 - 0.7 K/uL   Basophils Absolute 0.0 0.0 - 0.1 K/uL  Comp Met (CMET)     Status: Abnormal   Collection Time: 08/16/24  4:55 PM  Result Value Ref Range   Sodium 135 135 - 145 mEq/L   Potassium 4.4 3.5 - 5.1 mEq/L   Chloride 100 96 - 112 mEq/L   CO2 27 19 - 32 mEq/L   Glucose, Bld 100 (H) 70 - 99 mg/dL   BUN 15 6 - 23 mg/dL   Creatinine, Ser 8.98 0.40 - 1.50 mg/dL   Total Bilirubin 0.6 0.2 - 1.2 mg/dL   Alkaline Phosphatase 48 39 - 117 U/L   AST 26 0 - 37 U/L   ALT 40 0 - 53 U/L   Total Protein 7.1 6.0 - 8.3 g/dL   Albumin 4.4 3.5 - 5.2 g/dL   GFR 13.44 >39.99 mL/min    Comment: Calculated using the CKD-EPI Creatinine Equation (2021)   Calcium 9.1 8.4 - 10.5 mg/dL  Vitamin B12     Status: Abnormal   Collection Time: 08/16/24  4:55 PM  Result Value Ref Range   Vitamin B-12 945 (H) 211 - 911 pg/mL  HgB A1c     Status: None   Collection Time: 08/16/24  4:55 PM  Result Value Ref Range   Hgb A1c MFr Bld 5.3 4.6 - 6.5 %    Comment: Glycemic Control Guidelines for People with Diabetes:Non Diabetic:  <6%Goal of Therapy: <7%Additional Action Suggested:  >8%   TSH     Status: None   Collection Time: 08/16/24  4:55 PM  Result Value Ref Range   TSH 1.58 0.35 - 5.50 uIU/mL  POCT rapid strep A     Status: Normal   Collection Time: 09/11/24 12:01 PM  Result Value Ref Range   Rapid Strep A Screen Negative Negative  POC SARS Coronavirus 2 Ag      Status: None   Collection Time: 09/11/24 12:01 PM  Result Value Ref Range   SARS Coronavirus 2 Ag Negative Negative  Culture, group A strep     Status: None   Collection Time: 09/11/24 12:16 PM   Specimen: Throat  Result  Value Ref Range   Specimen Description THROAT    Special Requests NONE    Culture      NO GROUP A STREP (S.PYOGENES) ISOLATED Performed at [facility], [address], [place], [place] [address]    Report Status 09/13/2024 FINAL   CBC w/Diff     Status: Abnormal   Collection Time: 09/19/24 10:25 AM  Result Value Ref Range   WBC 5.6 4.0 - 10.5 K/uL   RBC 4.59 4.22 - 5.81 Mil/uL   Hemoglobin 14.1 13.0 - 17.0 g/dL   HCT 59.5 60.9 - 47.9 %   MCV 88.1 78.0 - 100.0 fl   MCHC 34.9 30.0 - 36.0 g/dL   RDW 86.7 88.4 - 84.4 %   Platelets 207.0 150.0 - 400.0 K/uL   Neutrophils Relative % 51.5 43.0 - 77.0 %   Lymphocytes Relative 32.7 12.0 - 46.0 %   Monocytes Relative 14.2 (H) 3.0 - 12.0 %   Eosinophils Relative 1.1 0.0 - 5.0 %   Basophils Relative 0.5 0.0 - 3.0 %   Neutro Abs 2.9 1.4 - 7.7 K/uL   Lymphs Abs 1.8 0.7 - 4.0 K/uL   Monocytes Absolute 0.8 0.1 - 1.0 K/uL   Eosinophils Absolute 0.1 0.0 - 0.7 K/uL   Basophils Absolute 0.0 0.0 - 0.1 K/uL  Lactic acid, plasma     Status: None   Collection Time: 10/25/24  2:02 AM  Result Value Ref Range   Lactic Acid, Venous 1.0 0.5 - 1.9 mmol/L    Comment: Performed at [facility], [address] [place], [place] [address]  Comprehensive metabolic panel     Status: Abnormal   Collection Time: 10/25/24  2:02 AM  Result Value Ref Range   Sodium 137 135 - 145 mmol/L   Potassium 3.9 3.5 - 5.1 mmol/L   Chloride 100 98 - 111 mmol/L   CO2 24 22 - 32 mmol/L   Glucose, Bld 130 (H) 70 - 99 mg/dL    Comment: Glucose reference range applies only to samples taken after fasting for at least 8 hours.   BUN 11 6 - 20 mg/dL   Creatinine, Ser 8.98 0.61 - 1.24 mg/dL   Calcium 9.2 8.9 - 89.6 mg/dL   Total Protein 7.3  6.5 - 8.1 g/dL   Albumin 4.2 3.5 - 5.0 g/dL   AST 29 15 - 41 U/L   ALT 45 (H) 0 - 44 U/L   Alkaline Phosphatase 60 38 - 126 U/L   Total Bilirubin 0.9 0.0 - 1.2 mg/dL   GFR, Estimated >39 >39 mL/min    Comment: (NOTE) Calculated using the CKD-EPI Creatinine Equation (2021)    Anion gap 13 5 - 15    Comment: Performed at [facility], [address] [place], [place] [address]  CBC with Differential     Status: Abnormal   Collection Time: 10/25/24  2:02 AM  Result Value Ref Range   WBC 11.0 (H) 4.0 - 10.5 K/uL   RBC 4.34 4.22 - 5.81 MIL/uL   Hemoglobin 13.6 13.0 - 17.0 g/dL   HCT 62.0 (L) 60.9 - 47.9 %   MCV 87.3 80.0 - 100.0 fL   MCH 31.3 26.0 - 34.0 pg   MCHC 35.9 30.0 - 36.0 g/dL   RDW 87.2 88.4 - 84.4 %   Platelets 213 150 - 400 K/uL   nRBC 0.0 0.0 - 0.2 %   Neutrophils Relative % 71 %  Neutro Abs 7.8 (H) 1.7 - 7.7 K/uL   Lymphocytes Relative 14 %   Lymphs Abs 1.6 0.7 - 4.0 K/uL   Monocytes Relative 15 %   Monocytes Absolute 1.6 (H) 0.1 - 1.0 K/uL   Eosinophils Relative 0 %   Eosinophils Absolute 0.0 0.0 - 0.5 K/uL   Basophils Relative 0 %   Basophils Absolute 0.0 0.0 - 0.1 K/uL   Immature Granulocytes 0 %   Abs Immature Granulocytes 0.03 0.00 - 0.07 K/uL    Comment: Performed at [facility], [address] [place], [place] [address]    HEATHER JULIANNA QUALE

## 2024-10-25 NOTE — Progress Notes (Signed)
 S/p right index finger flexor tendon sheath I&D 10/25/2024  Encourage elevation, finger range of motion OT order placed for finger range of motion within limits of bandage Begin dial soap soaks tomorrow TID  Infectious disease consult for antibiotic guidance NWB RUE

## 2024-10-25 NOTE — ED Provider Notes (Signed)
 [facility] Provider Note   CSN: [id number] Arrival date & time: 10/25/24  0146     Patient presents with: Post-op Problem   Scott Phillips is a 53 y.o. male.   The history is provided by the patient and medical records.  Scott Phillips is a 53 y.o. male who presents to the Emergency Department complaining of hand pain.  He presents to the emergency department for evaluation of right hand pain and fever.  He has status post trigger finger surgery on October 23.  He followed up in the office on October 27 and was started on doxycycline  for possible infection.  This evening he developed fever to 101.4 with increased pain and swelling to the right hand.  No cough, nausea, vomiting.     Prior to Admission medications   Medication Sig Start Date End Date Taking? Authorizing Provider  b complex vitamins tablet Take 1 tablet by mouth 3 (three) times a week.    [provider]  Calcium-Magnesium-Zinc (CALCIUM-MAGNESUIUM-ZINC PO) Take by mouth 2 (two) times daily.    [provider]  doxycycline  (VIBRA -TABS) 100 MG tablet Take 1 tablet (100 mg total) by mouth 2 (two) times daily. 09/19/24   Domenica Harlene LABOR, MD  famotidine  (PEPCID ) 20 MG tablet Take 1 tablet (20 mg total) by mouth 2 (two) times daily before a meal. 07/10/20   Paz, Jose E, MD  melatonin 5 MG TABS Take 5 mg by mouth at bedtime.    [provider]  METAMUCIL FIBER PO Take by mouth.    [provider]  Misc Natural Products (TURMERIC CURCUMIN) CAPS Take 1,000 each by mouth.    [provider]  Multiple Vitamin (MULTIVITAMIN ADULT PO) Take by mouth.    [provider]  pantoprazole  (PROTONIX ) 40 MG tablet Take 1 tablet (40 mg total) by mouth 2 (two) times daily before a meal. 03/18/24   Amon Aloysius BRAVO, MD  Probiotic Product (PROBIOTIC-10 PO) Take by mouth.    [provider]  promethazine -dextromethorphan (PROMETHAZINE -DM)  6.25-15 MG/5ML syrup Take 5 mLs by mouth at bedtime as needed for cough. 09/11/24   Enedelia Dorna HERO, FNP    Allergies: Milk-related compounds    Review of Systems  All other systems reviewed and are negative.   Updated Vital Signs BP 130/80   Pulse 89   Temp 98.5 F (36.9 C) (Oral)   Resp 16   Ht 5' 8 (1.727 m)   Wt 108.9 kg   SpO2 99%   BMI 36.49 kg/m   Physical Exam Vitals and nursing note reviewed.  Constitutional:      Appearance: He is well-developed.  HENT:     Head: Normocephalic and atraumatic.  Cardiovascular:     Rate and Rhythm: Normal rate and regular rhythm.  Pulmonary:     Effort: Pulmonary effort is normal. No respiratory distress.  Musculoskeletal:     Comments: There is soft tissue swelling and erythema throughout the right second digit.  Erythema and edema extends to the palmar aspect of the right hand.  Patient is unable to flex the digit.  Skin:    General: Skin is warm and dry.  Neurological:     Mental Status: He is alert and oriented to person, place, and time.  Psychiatric:        Behavior: Behavior normal.        (all labs ordered are listed, but only abnormal results are displayed) Labs  Reviewed  COMPREHENSIVE METABOLIC PANEL WITH GFR - Abnormal; Notable for the following components:      Result Value   Glucose, Bld 130 (*)    ALT 45 (*)    All other components within normal limits  CBC WITH DIFFERENTIAL/PLATELET - Abnormal; Notable for the following components:   WBC 11.0 (*)    HCT 37.9 (*)    Neutro Abs 7.8 (*)    Monocytes Absolute 1.6 (*)    All other components within normal limits  LACTIC ACID, PLASMA    EKG: None  Radiology: DG Chest 2 View Result Date: 10/25/2024 EXAM: 2 VIEW(S) XRAY OF THE CHEST 10/25/2024 02:31:00 AM COMPARISON: 09/19/2024 CLINICAL HISTORY: Surgery for trigger finger on 10/20/24, patient followed up with surgeon on 10/24/24 with concerns for possible infection. Patient was prescribed  Doxycycline . Pt developed a fever yesterday. FINDINGS: LUNGS AND PLEURA: No focal pulmonary opacity. No pulmonary edema. No pleural effusion. No pneumothorax. HEART AND MEDIASTINUM: No acute abnormality of the cardiac and mediastinal silhouettes. BONES AND SOFT TISSUES: No acute osseous abnormality. IMPRESSION: 1. No acute process. Electronically signed by: Dorethia Molt MD 10/25/2024 02:43 AM EDT RP Workstation: [id number]     Procedures   Medications Ordered in the ED  vancomycin (VANCOCIN) IVPB 1000 mg/200 mL premix (1,000 mg Intravenous New Bag/Given 10/25/24 0406)  lactated ringers  infusion ( Intravenous New Bag/Given 10/25/24 0402)  oxyCODONE (Oxy IR/ROXICODONE) immediate release tablet 5 mg (has no administration in time range)  morphine (PF) 4 MG/ML injection 4 mg (4 mg Intravenous Given 10/25/24 0404)  ondansetron  (ZOFRAN ) injection 4 mg (4 mg Intravenous Given 10/25/24 0403)                                    Medical Decision Making Amount and/or Complexity of Data Reviewed Labs: ordered. Radiology: ordered.  Risk Prescription drug management. Decision regarding hospitalization.   Patient's status post trigger finger surgery on October 23 here for evaluation of increased pain and swelling to the hand as well as fever at home to 101.  He does have evidence of cellulitis, flexor tenosynovitis on examination.  CBC with mild leukocytosis.  He is afebrile in the emergency department with normal lactic acid.  He was started on vancomycin.  Discussed with Heather, APP that is working with Dr. Alyse.  Recommendation is to admit to the medicine service with plan for surgical debridement at [facility] later today.  Patient updated of findings of studies and he is in agreement with treatment plan.     Final diagnoses:  Wound infection after surgery    ED Discharge Orders     None          Griselda Norris, MD 10/25/24 0430

## 2024-10-25 NOTE — ED Notes (Signed)
 [facility] into transport to [facility]. Inpatient nurse to return my call for report

## 2024-10-25 NOTE — H&P (Addendum)
 History and Physical    Patient: Scott Phillips FMW:[id number] DOB: December 31, 1970 DOA: 10/25/2024 DOS: the patient was seen and examined on 10/25/2024 PCP: Amon Aloysius BRAVO, MD  Patient coming from: Home  Chief Complaint:  Chief Complaint  Patient presents with   Post-op Problem   HPI: Scott Phillips is a 53 y.o. male with medical history significant of GERD, allergic sinusitis, depression and recent OP treatment for R index trigger finger who p/w pain/swelling of R index finger.  The patient had a recent trigger finger release surgery last Thursday, which was the fourth time undergoing such a procedure, but this time on a different finger. A couple of days post-surgery, the patient experienced significant pain and swelling. On Monday night, the patient's fever spiked to approximately 101.38F, and the affected finger became severely swollen. The patient sought care at the [facility] ER, where two rounds of intravenous antibiotics were administered. The patient also received morphine and doxycycline  intermittently for pain management. Despite these interventions, the patient continued to experience episodes of severe pain, relieved only by ice packs. Pt transferred to [facility] for surgical evaluation.  In the OSH ED, pt AFVSS. Labs unremarkable. OSH EDP consulted hand surgery who requested [facility] admission for surgical evaluation.   Review of Systems: As mentioned in the history of present illness. All other systems reviewed and are negative. Past Medical History:  Diagnosis Date   Allergic rhinosinusitis    Allergy    Arthritis    Colon polyps    Depression    Diverticulitis    GERD (gastroesophageal reflux disease)    Sinusitis    Sleep apnea    Past Surgical History:  Procedure Laterality Date   CARPAL TUNNEL RELEASE     KNEE ARTHROSCOPY Right 1991   cyst removal   MAXILLARY ANTROSTOMY Right 06/29/2020   Procedure: RIGHT MAXILLARY ANTROSTOMY WITH TISSUE REMOVAL;  Surgeon:  Karis Clunes, MD;  Location:  SURGERY CENTER;  Service: ENT;  Laterality: Right;   NASAL SINUS SURGERY     x3 in [place]   SUBACROMIAL DECOMPRESSION Left    TRIGGER FINGER RELEASE Right 2018   R 3rd   VASECTOMY  2019   Social History:  reports that he quit smoking about 9 years ago. His smoking use included cigarettes. He started smoking about 32 years ago. He has a 46 pack-year smoking history. He has never used smokeless tobacco. He reports current alcohol use. He reports that he does not use drugs.  Allergies  Allergen Reactions   Milk-Related Compounds Other (See Comments)    Sinus issues- congestion     Family History  Problem Relation Age of Onset   Cancer Mother    Depression Mother    Hypertension Mother    Miscarriages / Stillbirths Mother    Cancer Father    Alcohol abuse Father    Arthritis Father    Diabetes Father    Hearing loss Father    Allergic rhinitis Sister    Depression Maternal Grandmother    Hearing loss Maternal Grandfather    Asthma Maternal Grandfather    Diabetes Paternal Grandfather    Colon cancer Neg Hx    Prostate cancer Neg Hx    CAD Neg Hx     Prior to Admission medications   Medication Sig Start Date End Date Taking? Authorizing Provider  b complex vitamins tablet Take 1 tablet by mouth 3 (three) times a week.    [provider]  Calcium-Magnesium-Zinc (CALCIUM-MAGNESUIUM-ZINC PO) Take by mouth 2 (two) times daily.    [provider]  doxycycline  (VIBRA -TABS) 100 MG tablet Take 1 tablet (100 mg total) by mouth 2 (two) times daily. 09/19/24   Domenica Harlene LABOR, MD  famotidine  (PEPCID ) 20 MG tablet Take 1 tablet (20 mg total) by mouth 2 (two) times daily before a meal. 07/10/20   Paz, Jose E, MD  HYDROcodone-acetaminophen  (NORCO/VICODIN) 5-325 MG tablet Take 1 tablet by mouth every 6 (six) hours.    [provider]  melatonin 5 MG TABS Take 5 mg by mouth at bedtime.    [provider]  METAMUCIL  FIBER PO Take by mouth.    [provider]  Misc Natural Products (TURMERIC CURCUMIN) CAPS Take 1,000 each by mouth.    [provider]  Multiple Vitamin (MULTIVITAMIN ADULT PO) Take by mouth.    [provider]  oxyCODONE (OXY IR/ROXICODONE) 5 MG immediate release tablet Take 1 tablet every 6 hours by oral route, for Pain. 10/24/24   [provider]  pantoprazole  (PROTONIX ) 40 MG tablet Take 1 tablet (40 mg total) by mouth 2 (two) times daily before a meal. 03/18/24   Amon Aloysius BRAVO, MD  Probiotic Product (PROBIOTIC-10 PO) Take by mouth.    [provider]  promethazine -dextromethorphan (PROMETHAZINE -DM) 6.25-15 MG/5ML syrup Take 5 mLs by mouth at bedtime as needed for cough. 09/11/24   Enedelia Dorna HERO, FNP    Physical Exam: Vitals:   10/25/24 0745 10/25/24 0915 10/25/24 1000 10/25/24 1052  BP:  (!) 156/88 (!) 144/94 (!) 134/99  Pulse: 72 76  78  Resp: 18   19  Temp: 98.6 F (37 C)   98.1 F (36.7 C)  TempSrc:    Oral  SpO2: 100% 100%  100%  Weight:      Height:       General: Alert, oriented x3, resting comfortably in no acute distress Respiratory: Lungs clear to auscultation bilaterally with normal respiratory effort; no w/r/r Cardiovascular: Regular rate and rhythm w/o m/r/g   Data Reviewed:  Lab Results  Component Value Date   WBC 11.0 (H) 10/25/2024   HGB 13.6 10/25/2024   HCT 37.9 (L) 10/25/2024   MCV 87.3 10/25/2024   PLT 213 10/25/2024   Lab Results  Component Value Date   GLUCOSE 130 (H) 10/25/2024   CALCIUM 9.2 10/25/2024   NA 137 10/25/2024   K 3.9 10/25/2024   CO2 24 10/25/2024   CL 100 10/25/2024   BUN 11 10/25/2024   CREATININE 1.01 10/25/2024   Lab Results  Component Value Date   ALT 45 (H) 10/25/2024   AST 29 10/25/2024   ALKPHOS 60 10/25/2024   BILITOT 0.9 10/25/2024   No results found for: INR, PROTIME Radiology: DG Chest 2 View Result Date: 10/25/2024 EXAM: 2 VIEW(S) XRAY OF THE CHEST  10/25/2024 02:31:00 AM COMPARISON: 09/19/2024 CLINICAL HISTORY: Surgery for trigger finger on 10/20/24, patient followed up with surgeon on 10/24/24 with concerns for possible infection. Patient was prescribed Doxycycline . Pt developed a fever yesterday. FINDINGS: LUNGS AND PLEURA: No focal pulmonary opacity. No pulmonary edema. No pleural effusion. No pneumothorax. HEART AND MEDIASTINUM: No acute abnormality of the cardiac and mediastinal silhouettes. BONES AND SOFT TISSUES: No acute osseous abnormality. IMPRESSION: 1. No acute process. Electronically signed by: Dorethia Molt MD 10/25/2024 02:43 AM EDT RP Workstation: [id number]    Assessment and Plan: 67M h/o GERD, allergic sinusitis, depression and recent OP treatment for R index trigger finger  who p/w pain/swelling of R index finger.  R index finger swelling/pain Post-op complication -Hand surgery consulted; apprec eval/recs -ID consulted per hand surgery request; apprec eval/recs -Contiue IV vanc/cefepime for now (appreciate ID input) -IV morphine 2mg  q4h prn -MIVF: LR at 100cc/h for now  GERD -PTA famotidine  20mg  BID   Advance Care Planning:   Code Status: Full Code   Consults: Hand surgery  Family Communication: Wife  Severity of Illness: The appropriate patient status for this patient is INPATIENT. Inpatient status is judged to be reasonable and necessary in order to provide the required intensity of service to ensure the patient's safety. The patient's presenting symptoms, physical exam findings, and initial radiographic and laboratory data in the context of their chronic comorbidities is felt to place them at high risk for further clinical deterioration. Furthermore, it is not anticipated that the patient will be medically stable for discharge from the hospital within 2 midnights of admission.   * I certify that at the point of admission it is my clinical judgment that the patient will require inpatient hospital care spanning  beyond 2 midnights from the point of admission due to high intensity of service, high risk for further deterioration and high frequency of surveillance required.*   ------- I spent 55 minutes reviewing previous notes, at the bedside counseling/discussing the treatment plan, and performing clinical documentation.  Author: Marsha Ada, MD 10/25/2024 11:53 AM  For on call review [email or web address].

## 2024-10-25 NOTE — Anesthesia Preprocedure Evaluation (Signed)
 Anesthesia Evaluation  Patient identified by MRN, date of birth, ID band Patient awake    Reviewed: Allergy & Precautions, NPO status , Patient's Chart, lab work & pertinent test results  Airway Mallampati: III  TM Distance: >3 FB Neck ROM: Full    Dental  (+) Dental Advisory Given   Pulmonary sleep apnea , former smoker   breath sounds clear to auscultation       Cardiovascular negative cardio ROS  Rhythm:Regular     Neuro/Psych negative neurological ROS  negative psych ROS   GI/Hepatic Neg liver ROS,GERD  Medicated,,  Endo/Other  negative endocrine ROS    Renal/GU negative Renal ROS     Musculoskeletal   Abdominal   Peds  Hematology negative hematology ROS (+)   Anesthesia Other Findings   Reproductive/Obstetrics                              Anesthesia Physical Anesthesia Plan  ASA: 2  Anesthesia Plan: General   Post-op Pain Management: Ofirmev  IV (intra-op)*   Induction: Intravenous  PONV Risk Score and Plan: 2 and Ondansetron  and Dexamethasone   Airway Management Planned: LMA and Oral ETT  Additional Equipment: None  Intra-op Plan:   Post-operative Plan: Extubation in OR  Informed Consent: I have reviewed the patients History and Physical, chart, labs and discussed the procedure including the risks, benefits and alternatives for the proposed anesthesia with the patient or authorized representative who has indicated his/her understanding and acceptance.     Dental advisory given  Plan Discussed with: CRNA  Anesthesia Plan Comments:          Anesthesia Quick Evaluation

## 2024-10-25 NOTE — Anesthesia Procedure Notes (Signed)
 Procedure Name: LMA Insertion Date/Time: 10/25/2024 3:10 PM  Performed by: Zelphia Norleen HERO, CRNAPre-anesthesia Checklist: Patient identified, Emergency Drugs available, Suction available and Patient being monitored Patient Re-evaluated:Patient Re-evaluated prior to induction Oxygen Delivery Method: Circle system utilized Preoxygenation: Pre-oxygenation with 100% oxygen Induction Type: IV induction LMA: LMA inserted LMA Size: 5.0 Tube type: Oral Number of attempts: 1 Placement Confirmation: positive ETCO2 and breath sounds checked- equal and bilateral Tube secured with: Tape Dental Injury: Teeth and Oropharynx as per pre-operative assessment

## 2024-10-25 NOTE — Plan of Care (Signed)
 Plan of Care Note for accepted transfer   Patient name: Scott Phillips FMW:[id number] DOB: 1971-11-15  Facility requesting transfer: [facility] Requesting Provider: Dr. Griselda Reason for transfer: Post-op infection Facility course: 53 year old male with history of GERD, depression, diverticulitis, sleep apnea.  Recently underwent surgery for trigger finger on October 23.  He followed up in the office on October 27 and was started on doxycycline  for possible infection.  Now developed fever at home and increasing pain and swelling of the right hand.  Afebrile in the ED/vital signs stable.  WBC count 11.1, lactate normal.  Patient was started on vancomycin.  EDP discussed the case with [facility] Heather working with Dr. Alyse.  They requested admission to [facility], keeping n.p.o., and planning on surgery later today.  Plan of care: The patient is accepted for admission to Med-surg  unit at [facility].  [facility] will assume care on arrival to accepting facility. Until arrival, care as per EDP. However, [facility] available 24/7 for questions and assistance.  Check [email or web address] for on-call coverage.  Nursing staff, please call [facility] Admits & Consults System-Wide number under Amion on patient's arrival so appropriate admitting provider can evaluate the pt.

## 2024-10-25 NOTE — Op Note (Signed)
 OPERATIVE NOTE  DATE OF PROCEDURE: 10/25/2024  SURGEONS:  Primary: Alyse Agent, MD  ASSISTANT: Heather Quale, PA-C  Due to the complexity of the surgery an assistant was necessary to aid in retraction, exposure, limb positioning, closure and dressing application. The use of an assistant on this case follows CMS and CPT guidelines, which allows an assistant to be used because of the complexity level of this case.   PREOPERATIVE DIAGNOSIS: infection to right index finger  POSTOPERATIVE DIAGNOSIS: Same  NAME OF PROCEDURE:   Right index finger flexor tendon sheath I&D  ANESTHESIA: General + Local  SKIN PREPARATION: Hibiclens  ESTIMATED BLOOD LOSS: Minimal  IMPLANTS: none  INDICATIONS:  Scott Phillips is a 53 y.o. male who has the above preoperative diagnosis. The patient has decided to proceed with surgical intervention.  Risks, benefits and alternatives of operative management were discussed including, but not limited to, risks of anesthesia complications, infection, pain, persistent symptoms, stiffness, need for future surgery.  The patient understands, agrees and elects to proceed with surgery.    DESCRIPTION OF PROCEDURE: The patient was met in the pre-operative area and their identity was verified.  The operative location and laterality was also verified and marked.  The patient was brought to the OR and was placed supine on the table.  After repeat patient identification with the operative team anesthesia was provided and the patient was prepped and draped in the usual sterile fashion.  A final timeout was performed verifying the correction patient, procedure, location and laterality.  The right upper extremity was elevated and tourniquet inflated to 250 m of mercury.  The previous surgical site over the A1 pulley of the right index finger was identified and the sutures were removed and the incision was reopened.  There was some purulence however mainly cloudy fluid and this did travel  down the flexor tendon sheath.  I made a separate transverse incision across the A3 pulley at the volar aspect of the PIP joint the right index finger.  This was the extent of the spread clinically.  I incised the tendon sheath at the A3 pulley and we irrigated the tendon sheath thoroughly with normal saline via low flow cystoscopy tubing.  There is no further frank purulence.  Cultures were obtained prior to the irrigation.  The wound appeared clean and there was no further purulence proximal or distal clinically.  The wound edges were left open.  These were dressed with antibiotic ointment and a sterile soft bandage.  The tourniquet was deflated and all digits were pink warm and well-perfused with brisk capillary fill.  All counts were correct x 2.  The patient tolerated the procedure well was awoken from anesthesia and brought to PACU for recovery in stable condition.   Agent Robynn Alyse, MD

## 2024-10-25 NOTE — Transfer of Care (Signed)
 Immediate Anesthesia Transfer of Care Note  Patient: Scott Phillips  Procedure(s) Performed: IRRIGATION AND DEBRIDEMENT WOUND (Right)  Patient Location: PACU  Anesthesia Type:General  Level of Consciousness: awake and alert   Airway & Oxygen Therapy: Patient Spontanous Breathing  Post-op Assessment: Report given to RN and Post -op Vital signs reviewed and stable  Post vital signs: Reviewed and stable  Last Vitals:  Vitals Value Taken Time  BP 129/67 10/25/24 16:01  Temp 37.3 C 10/25/24 16:00  Pulse 90 10/25/24 16:08  Resp 18 10/25/24 16:08  SpO2 96 % 10/25/24 16:08  Vitals shown include unfiled device data.  Last Pain:  Vitals:   10/25/24 1600  TempSrc:   PainSc: Asleep         Complications: No notable events documented.

## 2024-10-25 NOTE — Progress Notes (Signed)
 RN provided report to Short Stay RN and answered all questions. CHG completed, patient voided, teeth brushed. Informed consent completed and signed.

## 2024-10-25 NOTE — Interval H&P Note (Signed)
 History and Physical Interval Note:  10/25/2024 2:45 PM  Scott Phillips  has presented today for surgery, with the diagnosis of infection to right index finger.  The various methods of treatment have been discussed with the patient and family. After consideration of risks, benefits and other options for treatment, the patient has consented to  Procedure(s) with comments: IRRIGATION AND DEBRIDEMENT WOUND (Right) - RIGHT INDEX FINGER IRRIGATION AND DEBRIDEMENT as a surgical intervention.  The patient's history has been reviewed, patient examined, no change in status, stable for surgery.  I have reviewed the patient's chart and labs.  Questions were answered to the patient's satisfaction.     Lynwood Rias

## 2024-10-25 NOTE — ED Notes (Signed)
 ED Provider at bedside.

## 2024-10-26 ENCOUNTER — Encounter (HOSPITAL_COMMUNITY): Payer: Self-pay | Admitting: Orthopedic Surgery

## 2024-10-26 DIAGNOSIS — M6510 Other infective (teno)synovitis, unspecified site: Secondary | ICD-10-CM

## 2024-10-26 DIAGNOSIS — T8140XA Infection following a procedure, unspecified, initial encounter: Secondary | ICD-10-CM | POA: Diagnosis not present

## 2024-10-26 DIAGNOSIS — T8149XA Infection following a procedure, other surgical site, initial encounter: Principal | ICD-10-CM

## 2024-10-26 DIAGNOSIS — Z5181 Encounter for therapeutic drug level monitoring: Secondary | ICD-10-CM | POA: Diagnosis not present

## 2024-10-26 LAB — CBC WITH DIFFERENTIAL/PLATELET
Abs Immature Granulocytes: 0.09 K/uL — ABNORMAL HIGH (ref 0.00–0.07)
Basophils Absolute: 0 K/uL (ref 0.0–0.1)
Basophils Relative: 0 %
Eosinophils Absolute: 0 K/uL (ref 0.0–0.5)
Eosinophils Relative: 0 %
HCT: 38.1 % — ABNORMAL LOW (ref 39.0–52.0)
Hemoglobin: 13.6 g/dL (ref 13.0–17.0)
Immature Granulocytes: 1 %
Lymphocytes Relative: 7 %
Lymphs Abs: 1.2 K/uL (ref 0.7–4.0)
MCH: 30.6 pg (ref 26.0–34.0)
MCHC: 35.7 g/dL (ref 30.0–36.0)
MCV: 85.8 fL (ref 80.0–100.0)
Monocytes Absolute: 1.8 K/uL — ABNORMAL HIGH (ref 0.1–1.0)
Monocytes Relative: 10 %
Neutro Abs: 14.4 K/uL — ABNORMAL HIGH (ref 1.7–7.7)
Neutrophils Relative %: 82 %
Platelets: 284 K/uL (ref 150–400)
RBC: 4.44 MIL/uL (ref 4.22–5.81)
RDW: 12.4 % (ref 11.5–15.5)
WBC: 17.5 K/uL — ABNORMAL HIGH (ref 4.0–10.5)
nRBC: 0 % (ref 0.0–0.2)

## 2024-10-26 LAB — GLUCOSE, CAPILLARY: Glucose-Capillary: 133 mg/dL — ABNORMAL HIGH (ref 70–99)

## 2024-10-26 LAB — CK: Total CK: 100 U/L (ref 49–397)

## 2024-10-26 MED ORDER — BACITRACIN ZINC 500 UNIT/GM EX OINT
TOPICAL_OINTMENT | Freq: Three times a day (TID) | CUTANEOUS | Status: DC
  Administered 2024-10-26 – 2024-10-28 (×5): 31.5 via TOPICAL
  Filled 2024-10-26: qty 28.4

## 2024-10-26 MED ORDER — DAPTOMYCIN-SODIUM CHLORIDE 700-0.9 MG/100ML-% IV SOLN
8.0000 mg/kg | Freq: Every day | INTRAVENOUS | Status: DC
  Administered 2024-10-26: 700 mg via INTRAVENOUS
  Filled 2024-10-26 (×2): qty 100

## 2024-10-26 MED ORDER — OXYCODONE HCL 5 MG PO TABS
5.0000 mg | ORAL_TABLET | Freq: Four times a day (QID) | ORAL | Status: DC | PRN
  Administered 2024-10-26 – 2024-10-28 (×5): 5 mg via ORAL
  Filled 2024-10-26 (×7): qty 1

## 2024-10-26 MED ORDER — CHLORHEXIDINE GLUCONATE CLOTH 2 % EX PADS
6.0000 | MEDICATED_PAD | Freq: Every day | CUTANEOUS | Status: DC
  Administered 2024-10-26 – 2024-10-28 (×3): 6 via TOPICAL

## 2024-10-26 MED ORDER — MUPIROCIN 2 % EX OINT
1.0000 | TOPICAL_OINTMENT | Freq: Two times a day (BID) | CUTANEOUS | Status: DC
  Administered 2024-10-26 – 2024-10-28 (×5): 1 via NASAL
  Filled 2024-10-26: qty 22

## 2024-10-26 MED ORDER — ACETAMINOPHEN 500 MG PO TABS
1000.0000 mg | ORAL_TABLET | Freq: Three times a day (TID) | ORAL | Status: DC
  Administered 2024-10-26 – 2024-10-28 (×7): 1000 mg via ORAL
  Filled 2024-10-26 (×7): qty 2

## 2024-10-26 NOTE — Progress Notes (Signed)
 Right hand dressing change performed @ 1630. Dial soap not available. R hand soaked in saline for , non adherent placed, with kerlix wrap. Pt request antibiotic ointment on wound bed during dressing change. RN reached out to on call Ortho, PA-C

## 2024-10-26 NOTE — Progress Notes (Incomplete)
Dapto

## 2024-10-26 NOTE — Evaluation (Signed)
 Occupational Therapy Evaluation Patient Details Name: Scott Phillips MRN: [id number] DOB: 1971/12/24 Today's Date: 10/26/2024   History of Present Illness   Pt is a 53 y.o. male who presented 10/25/24 due to Pt s/p R index finger flexor tendon sheath I&D 10/25/24. PMH: GERD, allergic sinusitis, depression and recent OP treatment for R index A1 pulley release on 10/20/2024 and then developed pain on 10/22/24 and then was seen in OP 10/27 and was started on antibiotics.     Clinical Impressions Pt reported they live with wife who can assist before/after work as needed. He was able to ambulate the unit with supervision and complete 3 steps x2 trials (within limits of IV). He was able to demonstrate while sitting UB/LB dressing with mod I and was educated about wearing loose fitting clothing due to decrease in FM skills/ bilateral UE tasks. He was administered foam for built up utensils and Dycem in session to assist in self feeding tasks. Throughout the session was educated on positioning, ice and range of motion within the limits of the bandages of RUE.  At this time recommendation for OP CHT with Acute OT to follow.     If plan is discharge home, recommend the following:   Assistance with cooking/housework;Assist for transportation     Functional Status Assessment   Patient has had a recent decline in their functional status and demonstrates the ability to make significant improvements in function in a reasonable and predictable amount of time.     Equipment Recommendations    (none-pt reported all needs met)     Recommendations for Other Services         Precautions/Restrictions   Precautions Precautions: Fall Recall of Precautions/Restrictions: Intact Restrictions Weight Bearing Restrictions Per Provider Order: Yes RUE Weight Bearing Per Provider Order: Non weight bearing Other Position/Activity Restrictions: able to complete ROM within limits of bandages      Mobility Bed Mobility Overal bed mobility: Modified Independent             General bed mobility comments: HOB elevated    Transfers Overall transfer level: Modified independent Equipment used: None               General transfer comment: pushed IV pole      Balance Overall balance assessment: Modified Independent                                         ADL either performed or assessed with clinical judgement   ADL Overall ADL's : Needs assistance/impaired Eating/Feeding: Minimal assistance;Sitting   Grooming: Wash/dry hands;Wash/dry face;Sitting;Modified independent   Upper Body Bathing: Modified independent;Sitting   Lower Body Bathing: Modified independent;Sit to/from stand   Upper Body Dressing : Modified independent;Sitting   Lower Body Dressing: Modified independent Lower Body Dressing Details (indicate cue type and reason): without buttons Toilet Transfer: Supervision/safety;Ambulation   Toileting- Clothing Manipulation and Hygiene: Modified independent;Sit to/from stand       Functional mobility during ADLs: Modified independent       Vision Baseline Vision/History: 1 Wears glasses Ability to See in Adequate Light: 0 Adequate Patient Visual Report: No change from baseline Vision Assessment?: Wears glasses for reading     Perception Perception: Within Functional Limits       Praxis Praxis: [facility]       Pertinent Vitals/Pain Pain Assessment Pain Assessment: 0-10 Pain Score: 3  Pain Location:  R hand and increase in lowered with ambulation Pain Descriptors / Indicators: Discomfort, Guarding Pain Intervention(s): Limited activity within patient's tolerance, Monitored during session, Repositioned, Ice applied     Extremity/Trunk Assessment Upper Extremity Assessment Upper Extremity Assessment: RUE deficits/detail RUE Deficits / Details: Pt s/p R index finger tendon sheath and I&D and has banages on hand with the tip of  R index finger poking out and able to view DIPS and PIPS of all other joints but not MCPS. Pt had trace movemants in R index finger and limited AROM in all other joints due to edema. RUE Sensation: WNL RUE Coordination: decreased fine motor   Lower Extremity Assessment Lower Extremity Assessment: Overall WFL for tasks assessed   Cervical / Trunk Assessment Cervical / Trunk Assessment: Normal   Communication Communication Communication: No apparent difficulties   Cognition Arousal: Alert Behavior During Therapy: WFL for tasks assessed/performed Cognition: No apparent impairments                               Following commands: Intact       Cueing  General Comments   Cueing Techniques: Verbal cues      Exercises Exercises:  (Pt reviewed visually on different joints in the digit for AROM as can not view to bandages in place for R index finger and demonstrated with other digits in the hand within constraints of bandages and edema. He was able to complete trace movements of digit extension/flexion and no significant changes in pain. He then was able to complete for all other digits but limited due to bandages and edema. He did not report any sensation changes. 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) Available Help at Discharge: Family;Available PRN/intermittently (able to set up for the day) Type of Home: House Home Access: Level entry     Home Layout: Multi-level Alternate Level Stairs-Number of Steps: flight       Bathroom Toilet: Standard Bathroom Accessibility: Yes   Home Equipment: None          Prior Functioning/Environment Prior Level of Function : Independent/Modified Independent             Mobility Comments: mod I due to pain ADLs Comments: mod I due to  decrease use of RUE and wife set up    OT Problem List: Decreased strength;Decreased range of motion;Decreased activity tolerance;Impaired balance (sitting and/or standing);Decreased safety awareness;Decreased knowledge of use of DME or AE;Pain   OT Treatment/Interventions: Self-care/ADL training;Therapeutic exercise;DME and/or AE instruction;Patient/family education      OT Goals(Current goals can be found in the care plan section)   Acute Rehab OT Goals Patient Stated Goal: to heal OT Goal Formulation: With patient Time For Goal Achievement: 11/09/24 Potential to Achieve Goals: Good   OT Frequency:  Min 2X/week    Co-evaluation              AM-PAC OT 6 Clicks Daily Activity     Outcome Measure Help from another person eating meals?: None Help from another person taking care of personal grooming?: A Little Help from another person toileting, which includes using toliet, bedpan, or urinal?: A Little Help from another person bathing (including washing, rinsing, drying)?: A Little Help from another person to put on and taking off regular upper body clothing?: None Help  from another person to put on and taking off regular lower body clothing?: A Little 6 Click Score: 20   End of Session Equipment Utilized During Treatment: Gait belt Nurse Communication: Mobility status (wound care)  Activity Tolerance: Patient tolerated treatment well Patient left: in bed;with call bell/phone within reach  OT Visit Diagnosis: Unsteadiness on feet (R26.81);Other abnormalities of gait and mobility (R26.89);Muscle weakness (generalized) (M62.81);Pain Pain - Right/Left: Right Pain - part of body: Hand                Time: 8865-8794 OT Time Calculation (min): 31 min Charges:  OT General Charges $OT Visit: 1 Visit OT Evaluation $OT Eval Low Complexity: 1 Low OT Treatments $Therapeutic Exercise: 8-22 mins  Warrick POUR OTR/L  Acute Rehab Services  [phone] office number   Warrick Berber 10/26/2024, 12:21 PM

## 2024-10-26 NOTE — Progress Notes (Signed)
 Pharmacy Antibiotic Note  Scott Phillips is a 53 y.o. male admitted on 10/25/2024 with a finger infection.  Pharmacy has been consulted for daptomycin dosing.  Patient was admitted for an infection on the right index finger s/p recent trigger finger release surgery. Patient underwent I&D on 10/28 with cultures pending but growing GPC in pairs. Will transition to daptomycin while cultures finalize.   Plan: Daptomycin 700 mg IV Q24hr Monitor CK at baseline and once weekly (every Wednesday) while on daptomycin Monitor cultures and clinical status Narrow abx as able and f/u duration    Height: 5' 8 (172.7 cm) Weight: 108.9 kg (240 lb) IBW/kg (Calculated) : 68.4  Temp (24hrs), Avg:98.9 F (37.2 C), Min:98.4 F (36.9 C), Max:100.2 F (37.9 C)  Recent Labs  Lab 10/25/24 0202 10/26/24 0920  WBC 11.0* 17.5*  CREATININE 1.01  --   LATICACIDVEN 1.0  --     Estimated Creatinine Clearance: 104.7 mL/min (by C-G formula based on SCr of 1.01 mg/dL).    Allergies  Allergen Reactions   Milk-Related Compounds Other (See Comments)    Sinus congestion     Antimicrobials this admission: 10/28 cefaz x1 10/28 cefepime> 10/29 10/28 vanc> 10/29  Microbiology results: 10/28 wound pending 10/28 MRSA neg  B. Maegan Elveria Lauderbaugh, PharmD PGY-1 Pharmacy Resident [facility] 10/26/2024 12:05 PM

## 2024-10-26 NOTE — Plan of Care (Signed)
   Problem: Education: Goal: Knowledge of General Education information will improve Description Including pain rating scale, medication(s)/side effects and non-pharmacologic comfort measures Outcome: Progressing

## 2024-10-26 NOTE — Consult Note (Signed)
 [facility]    Date of Admission:  10/25/2024     Total days of antibiotics 1  Vancomycin   Cefepime         Reason for Consult: Post Op Infection     Referring Provider: Dahal Primary Care Provider: Amon Aloysius BRAVO, MD   Assessment: Scott Phillips is a 53 y.o. male admitted for   Post Op Infection Trigger Finger Surgery -  Reported abrupt pain / fever within 48 hours of surgery. Operative report indicates purulence that tracked to tendon. Cultures with gram stain that indicates likely staph infection (GPC Pairs). Will narrow to daptomycin for now and plan course of oral antibiotics given soft tissue.  Would be a good candidate for linezolid.  - Daptomycin IV  - Stop other abx - Follow pending micro  - No plans for IV abx outside of the hospital     Plan: - Daptomycin IV  - Stop other abx - Follow pending micro  - No plans for IV abx outside of the hospital    Principal Problem:   Post op infection    acetaminophen   1,000 mg Oral TID   Chlorhexidine Gluconate Cloth  6 each Topical Daily   famotidine   20 mg Oral BID AC   melatonin  5 mg Oral QHS   mupirocin  ointment  1 Application Nasal BID   pantoprazole   40 mg Oral BID AC    HPI: Scott Phillips is a 53 y.o. male here for debridement of post op infection.   He is s/p right index A1 pulley release from 10/20/2024 with Dr. Alyse. On POD2 he developed significant pain and presented to the office on 10/27 for evaluation. He was started on oral doxycycline  (had 2 doses) - he presented to ER when he developed a fever to 101 F. In ER he did not have another fever and mild leukocytosis noted. Hand surgery saw him and took him in for I&D on 10/28. Op note reviewed - area of purulence and cloudy fluid that traveled to the flexor tendon sheath. Cultures have gram positive cocci in pairs on stain with culture growing organism that is too young to identify.  His MSSA nasal PCR was  positive. MRSA negative.   He says that his hand feels immensely better after surgery. Still sore of course but the pain is significantly improved.  Did fine with the oral abx - some nausea   Review of Systems: Review of Systems  Constitutional:  Positive for fever.  Respiratory:  Negative for cough.   Cardiovascular:  Negative for chest pain.  Gastrointestinal:  Negative for abdominal pain, diarrhea, nausea and vomiting.  Genitourinary:  Negative for dysuria.  Musculoskeletal:  Positive for joint pain.    Past Medical History:  Diagnosis Date   Allergic rhinosinusitis    Allergy    Arthritis    Colon polyps    Depression    Diverticulitis    GERD (gastroesophageal reflux disease)    Sinusitis    Sleep apnea     Social History   Tobacco Use   Smoking status: Former    Current packs/day: 0.00    Average packs/day: 2.0 packs/day for 23.0 years (46.0 ttl pk-yrs)    Types: Cigarettes    Start date: 12/30/1991    Quit date: 12/29/2014    Years since quitting: 9.8   Smokeless tobacco: Never  Vaping Use   Vaping status: Never Used  Substance Use Topics   Alcohol use: Yes    Comment: occas   Drug use: Never    Family History  Problem Relation Age of Onset   Cancer Mother    Depression Mother    Hypertension Mother    Miscarriages / Stillbirths Mother    Cancer Father    Alcohol abuse Father    Arthritis Father    Diabetes Father    Hearing loss Father    Allergic rhinitis Sister    Depression Maternal Grandmother    Hearing loss Maternal Grandfather    Asthma Maternal Grandfather    Diabetes Paternal Grandfather    Colon cancer Neg Hx    Prostate cancer Neg Hx    CAD Neg Hx    Allergies  Allergen Reactions   Milk-Related Compounds Other (See Comments)    Sinus congestion     OBJECTIVE: Blood pressure 123/78, pulse 85, temperature 98.7 F (37.1 C), temperature source Oral, resp. rate 16, height 5' 8 (1.727 m), weight 108.9 kg, SpO2 99%.  Physical  Exam Vitals reviewed.  Constitutional:      Appearance: Normal appearance. He is not ill-appearing.  HENT:     Head: Normocephalic.     Mouth/Throat:     Mouth: Mucous membranes are moist.     Pharynx: Oropharynx is clear.  Eyes:     General: No scleral icterus. Cardiovascular:     Rate and Rhythm: Normal rate.  Pulmonary:     Effort: Pulmonary effort is normal.  Musculoskeletal:        General: Normal range of motion.     Cervical back: Normal range of motion.     Comments: Right hand in bandage, clean. Elevated   Skin:    Coloration: Skin is not jaundiced or pale.  Neurological:     Mental Status: He is alert and oriented to person, place, and time.  Psychiatric:        Mood and Affect: Mood normal.        Judgment: Judgment normal.     Lab Results Lab Results  Component Value Date   WBC 17.5 (H) 10/26/2024   HGB 13.6 10/26/2024   HCT 38.1 (L) 10/26/2024   MCV 85.8 10/26/2024   PLT 284 10/26/2024    Lab Results  Component Value Date   CREATININE 1.01 10/25/2024   BUN 11 10/25/2024   NA 137 10/25/2024   K 3.9 10/25/2024   CL 100 10/25/2024   CO2 24 10/25/2024    Lab Results  Component Value Date   ALT 45 (H) 10/25/2024   AST 29 10/25/2024   ALKPHOS 60 10/25/2024   BILITOT 0.9 10/25/2024     Microbiology: Recent Results (from the past 240 hours)  Surgical pcr screen     Status: Abnormal   Collection Time: 10/25/24 11:52 AM   Specimen: Nasal Mucosa; Nasal Swab  Result Value Ref Range Status   MRSA, PCR NEGATIVE NEGATIVE Final   Staphylococcus aureus POSITIVE (A) NEGATIVE Final    Comment: (NOTE) The Xpert SA Assay (FDA approved for NASAL specimens in patients 11 years of age and older), is one component of a comprehensive surveillance program. It is not intended to diagnose infection nor to guide or monitor treatment. Performed at [facility], [address], [place], [place] [address]   Aerobic Culture w Gram Stain (superficial specimen)      Status: None (Preliminary result)   Collection Time: 10/25/24  3:23 PM   Specimen: Wound  Result Value Ref Range Status   Specimen Description WOUND RIGHT FINGER  Final   Special Requests RT RING FINGER  Final   Gram Stain   Final    ABUNDANT WBC PRESENT, PREDOMINANTLY PMN MODERATE GRAM POSITIVE COCCI IN PAIRS    Culture   Final    TOO YOUNG TO READ Performed at [facility], [address] [place], [place] [address]    Report Status PENDING  Incomplete    Corean Fireman, MSN, NP-C [facility] Pager: [phone]  10/26/2024 12:18 PM    Total Encounter Time: 15 m

## 2024-10-26 NOTE — Anesthesia Postprocedure Evaluation (Signed)
 Anesthesia Post Note  Patient: Scott Phillips  Procedure(s) Performed: IRRIGATION AND DEBRIDEMENT WOUND (Right)     Patient location during evaluation: PACU Anesthesia Type: General Level of consciousness: awake and alert Pain management: pain level controlled Vital Signs Assessment: post-procedure vital signs reviewed and stable Respiratory status: spontaneous breathing, nonlabored ventilation and respiratory function stable Cardiovascular status: blood pressure returned to baseline and stable Postop Assessment: no apparent nausea or vomiting Anesthetic complications: no   No notable events documented.               Scott Phillips

## 2024-10-26 NOTE — Progress Notes (Signed)
 POD #1 Right index finger flexor tendon sheath I&D  Patient resting comfortably in bed, doing much better Pain significantly less Denies systemic symptoms today Dressing changed No drainage, no areas of fluctuance or induration, resolving erythema  He is able to gently flex the finger although limited Sensation intact to light touch Finger warm and well perfused  Begin soapy soaks three times a day (ordered) Encouraged finger range of motion (OT consulted) Encouraged elevation  ID consult pending Recommend labs today to trend WBC Appreciated medicines involvement with care

## 2024-10-26 NOTE — Progress Notes (Signed)
 PROGRESS NOTE  Scott BLAZEJEWSKI  DOB: Nov 22, 1971  PCP: Amon Aloysius BRAVO, MD FMW:[id number]  DOA: 10/25/2024  LOS: 1 day  Hospital Day: 2  Subjective: Patient was seen and examined this morning.  Pleasant middle-age Caucasian male.  Not in distress. Afebrile, hemodynamically stable, breathing on room air  Brief narrative: Scott Phillips is a 53 y.o. male with PMH significant for obesity, OSA, GERD, depression who underwent right index trigger finger release as an outpatient recently on 10/23. 10/28, patient presented to the ED with complaint of pain, swelling of the right index finger progressive for the last few days since surgery. On the night prior to presentation, spiked a fever of 101.4 and hence presented to ED  Admitted to [facility] for evaluation by hand surgery 10/28, patient underwent right index finger flexor tendon sheath I&D by Dr. Alyse  Assessment and plan: R index finger postop infection Had right index trigger finger release as an outpatient recently on 10/23. Presented with postop infection S/p I&D of right index finger flexor tendon sheath -Dr. Alyse -10/28 Currently on empiric IV antibiotics.  ID consult requested. Wound care instructions per orthopedics Pain regimen --- Scheduled: Start scheduled Tylenol  1 g 3 times daily, --- PRN: Morphine IV, add oxycodone 5 mg every 6 hours as needed Recent Labs  Lab 10/25/24 0202 10/26/24 0920  WBC 11.0* 17.5*  LATICACIDVEN 1.0  --     GERD famotidine  20mg  BID   Mobility: Encourage ambulation PT Orders:   PT Follow up Rec:    Goals of care   Code Status: Full Code     DVT prophylaxis:  SCDs Start: 10/25/24 1144   Antimicrobials: IV cefepime and IV vancomycin Fluid: None Consultants: Orthopedics, ID Family Communication: None at bedside  Status: Inpatient Level of care:  Med-Surg   Patient is from: Home Needs to continue in-hospital care: On IV antibiotics, pending culture report Anticipated d/c to:  Hopefully home in 1 to 2 days    Diet:  Diet Order             Diet regular Room service appropriate? Yes; Fluid consistency: Thin  Diet effective now                   Scheduled Meds:  acetaminophen   1,000 mg Oral TID   Chlorhexidine Gluconate Cloth  6 each Topical Daily   famotidine   20 mg Oral BID AC   melatonin  5 mg Oral QHS   mupirocin  ointment  1 Application Nasal BID   pantoprazole   40 mg Oral BID AC    PRN meds: morphine injection, ondansetron  (ZOFRAN ) IV, oxyCODONE   Infusions:   DAPTOmycin 700 mg (10/26/24 1220)    Antimicrobials: Anti-infectives (From admission, onward)    Start     Dose/Rate Route Frequency Ordered Stop   10/26/24 1100  DAPTOmycin (CUBICIN) IVPB 700 mg/158mL premix        8 mg/kg  84.6 kg (Adjusted) 200 mL/hr over 30 Minutes Intravenous Daily 10/26/24 1012     10/26/24 0600  ceFAZolin  (ANCEF ) IVPB 2g/100 mL premix        2 g 200 mL/hr over 30 Minutes Intravenous On call to O.R. 10/25/24 1243 10/25/24 1518   10/25/24 1800  vancomycin (VANCOCIN) IVPB 1000 mg/200 mL premix  Status:  Discontinued        1,000 mg 200 mL/hr over 60 Minutes Intravenous Every 12 hours 10/25/24 1657 10/26/24 1012   10/25/24 1745  ceFEPIme (MAXIPIME) 2 g in  sodium chloride  0.9 % 100 mL IVPB  Status:  Discontinued        2 g 200 mL/hr over 30 Minutes Intravenous Every 8 hours 10/25/24 1657 10/26/24 1012   10/25/24 0300  vancomycin (VANCOCIN) IVPB 1000 mg/200 mL premix        1,000 mg 200 mL/hr over 60 Minutes Intravenous Every 1 hr x 2 10/25/24 0240 10/25/24 0511       Objective: Vitals:   10/26/24 0420 10/26/24 0823  BP: 128/76 123/78  Pulse: 81 85  Resp:  16  Temp: 98.6 F (37 C) 98.7 F (37.1 C)  SpO2: 98% 99%    Intake/Output Summary (Last 24 hours) at 10/26/2024 1315 Last data filed at 10/25/2024 1829 Gross per 24 hour  Intake 773.28 ml  Output --  Net 773.28 ml   Filed Weights   10/25/24 0158 10/25/24 1328  Weight: 108.9 kg 108.9  kg   Weight change: 0 kg Body mass index is 36.49 kg/m.   Physical Exam: General exam: Pleasant, middle-aged Caucasian male.  Not in distress.  Pain controlled Skin: No rashes, lesions or ulcers. HEENT: Atraumatic, normocephalic, no obvious bleeding Lungs: Clear to auscultation bilaterally,  CVS: S1, S2, no murmur,   GI/Abd: Soft, nontender, nondistended, bowel sound present,   CNS: Alert, awake, oriented x 3 Psychiatry: Mood appropriate Extremities: No pedal edema, no calf tenderness, right hand on bandage.  Data Review: I have personally reviewed the laboratory data and studies available.  F/u labs ordered Unresulted Labs (From admission, onward)     Start     Ordered   11/02/24 0500  CK  Every Wednesday,   R      10/26/24 1012   10/27/24 0500  Basic metabolic panel with GFR  Tomorrow morning,   R        10/26/24 1315   10/27/24 0500  CBC with Differential/Platelet  Tomorrow morning,   R        10/26/24 1315            Signed, Chapman Rota, MD [facility] 10/26/2024

## 2024-10-27 DIAGNOSIS — T8149XD Infection following a procedure, other surgical site, subsequent encounter: Secondary | ICD-10-CM

## 2024-10-27 DIAGNOSIS — M6510 Other infective (teno)synovitis, unspecified site: Secondary | ICD-10-CM | POA: Diagnosis not present

## 2024-10-27 DIAGNOSIS — T8142XA Infection following a procedure, deep incisional surgical site, initial encounter: Secondary | ICD-10-CM | POA: Diagnosis not present

## 2024-10-27 DIAGNOSIS — B958 Unspecified staphylococcus as the cause of diseases classified elsewhere: Secondary | ICD-10-CM

## 2024-10-27 DIAGNOSIS — Z5181 Encounter for therapeutic drug level monitoring: Secondary | ICD-10-CM | POA: Diagnosis not present

## 2024-10-27 LAB — BASIC METABOLIC PANEL WITH GFR
Anion gap: 13 (ref 5–15)
BUN: 14 mg/dL (ref 6–20)
CO2: 24 mmol/L (ref 22–32)
Calcium: 9 mg/dL (ref 8.9–10.3)
Chloride: 103 mmol/L (ref 98–111)
Creatinine, Ser: 0.99 mg/dL (ref 0.61–1.24)
GFR, Estimated: >60 mL/min (ref >60)
Glucose, Bld: 101 mg/dL — ABNORMAL HIGH (ref 70–99)
Potassium: 4.7 mmol/L (ref 3.5–5.1)
Sodium: 140 mmol/L (ref 135–145)

## 2024-10-27 LAB — CBC WITH DIFFERENTIAL/PLATELET
Abs Immature Granulocytes: 0.05 K/uL (ref 0.00–0.07)
Basophils Absolute: 0 K/uL (ref 0.0–0.1)
Basophils Relative: 0 %
Eosinophils Absolute: 0 K/uL (ref 0.0–0.5)
Eosinophils Relative: 0 %
HCT: 37 % — ABNORMAL LOW (ref 39.0–52.0)
Hemoglobin: 13.3 g/dL (ref 13.0–17.0)
Immature Granulocytes: 1 %
Lymphocytes Relative: 24 %
Lymphs Abs: 2.5 K/uL (ref 0.7–4.0)
MCH: 31.6 pg (ref 26.0–34.0)
MCHC: 35.9 g/dL (ref 30.0–36.0)
MCV: 87.9 fL (ref 80.0–100.0)
Monocytes Absolute: 1.4 K/uL — ABNORMAL HIGH (ref 0.1–1.0)
Monocytes Relative: 14 %
Neutro Abs: 6.5 K/uL (ref 1.7–7.7)
Neutrophils Relative %: 61 %
Platelets: 265 K/uL (ref 150–400)
RBC: 4.21 MIL/uL — ABNORMAL LOW (ref 4.22–5.81)
RDW: 12.6 % (ref 11.5–15.5)
WBC: 10.5 K/uL (ref 4.0–10.5)
nRBC: 0 % (ref 0.0–0.2)

## 2024-10-27 MED ORDER — PROTAMINE SULFATE 10 MG/ML IV SOLN
INTRAVENOUS | Status: AC
  Filled 2024-10-27: qty 5

## 2024-10-27 MED ORDER — HEPARIN SODIUM (PORCINE) 1000 UNIT/ML IJ SOLN
INTRAMUSCULAR | Status: AC
  Filled 2024-10-27: qty 10

## 2024-10-27 MED ORDER — DAPTOMYCIN-SODIUM CHLORIDE 700-0.9 MG/100ML-% IV SOLN
8.0000 mg/kg | Freq: Every day | INTRAVENOUS | Status: DC
Start: 2024-10-27 — End: 2024-10-28
  Administered 2024-10-27: 700 mg via INTRAVENOUS
  Filled 2024-10-27 (×2): qty 100

## 2024-10-27 NOTE — Progress Notes (Signed)
 PROGRESS NOTE  Scott Phillips  DOB: 1971-11-14  PCP: Amon Aloysius BRAVO, MD FMW:[id number]  DOA: 10/25/2024  LOS: 2 days  Hospital Day: 3  Subjective: Patient was seen and examined this morning. Was getting wound dressed by RN.  Not in distress. Afebrile, hemodynamically stable, breathing room air Labs from this morning with WBC count 10.5, better than 17.5 yesterday.  Brief narrative: Scott Phillips is a 53 y.o. male with PMH significant for obesity, OSA, GERD, depression who underwent right index trigger finger release as an outpatient recently on 10/23. 10/28, patient presented to the ED with complaint of pain, swelling of the right index finger progressive for the last few days since surgery. On the night prior to presentation, spiked a fever of 101.4 and hence presented to ED  Admitted to [facility] for evaluation by hand surgery 10/28, patient underwent right index finger flexor tendon sheath I&D by Dr. Alyse  Assessment and plan: R index finger postop infection Had right index trigger finger release as an outpatient recently on 10/23. Presented with postop infection S/p I&D of right index finger flexor tendon sheath -Dr. Alyse -10/28 Culture from OR is growing moderate gram-positive cocci. Currently on empiric IV antibiotics per ID recommendation.  Hopefully culture will be back by tomorrow for oral antibiotics and discharge wound care instructions per orthopedics Pain regimen --- Scheduled: scheduled Tylenol  1 g 3 times daily, --- PRN: Morphine IV, add oxycodone 5 mg every 6 hours as needed Recent Labs  Lab 10/25/24 0202 10/26/24 0920 10/27/24 0418  WBC 11.0* 17.5* 10.5  LATICACIDVEN 1.0  --   --     GERD famotidine  20mg  BID   Mobility: Encourage ambulation PT Orders:   PT Follow up Rec:    Goals of care   Code Status: Full Code     DVT prophylaxis:  SCDs Start: 10/25/24 1144   Antimicrobials: IV cefepime and IV vancomycin Fluid: None Consultants:  Orthopedics, ID Family Communication: None at bedside  Status: Inpatient Level of care:  Med-Surg   Patient is from: Home Needs to continue in-hospital care: On IV antibiotics, pending culture report Anticipated d/c to: Hopefully home in 1 to 2 days    Diet:  Diet Order             Diet regular Room service appropriate? Yes; Fluid consistency: Thin  Diet effective now                   Scheduled Meds:  acetaminophen   1,000 mg Oral TID   bacitracin   Topical TID   Chlorhexidine Gluconate Cloth  6 each Topical Daily   famotidine   20 mg Oral BID AC   melatonin  5 mg Oral QHS   mupirocin  ointment  1 Application Nasal BID   pantoprazole   40 mg Oral BID AC    PRN meds: morphine injection, ondansetron  (ZOFRAN ) IV, oxyCODONE   Infusions:   DAPTOmycin 700 mg (10/27/24 1205)    Antimicrobials: Anti-infectives (From admission, onward)    Start     Dose/Rate Route Frequency Ordered Stop   10/27/24 1200  DAPTOmycin (CUBICIN) IVPB 700 mg/122mL premix        8 mg/kg  84.6 kg (Adjusted) 200 mL/hr over 30 Minutes Intravenous Daily 10/27/24 0923     10/26/24 1100  DAPTOmycin (CUBICIN) IVPB 700 mg/100mL premix  Status:  Discontinued        8 mg/kg  84.6 kg (Adjusted) 200 mL/hr over 30 Minutes Intravenous Daily 10/26/24 1012 10/27/24  9077   10/26/24 0600  ceFAZolin  (ANCEF ) IVPB 2g/100 mL premix        2 g 200 mL/hr over 30 Minutes Intravenous On call to O.R. 10/25/24 1243 10/25/24 1518   10/25/24 1800  vancomycin (VANCOCIN) IVPB 1000 mg/200 mL premix  Status:  Discontinued        1,000 mg 200 mL/hr over 60 Minutes Intravenous Every 12 hours 10/25/24 1657 10/26/24 1012   10/25/24 1745  ceFEPIme (MAXIPIME) 2 g in sodium chloride  0.9 % 100 mL IVPB  Status:  Discontinued        2 g 200 mL/hr over 30 Minutes Intravenous Every 8 hours 10/25/24 1657 10/26/24 1012   10/25/24 0300  vancomycin (VANCOCIN) IVPB 1000 mg/200 mL premix        1,000 mg 200 mL/hr over 60 Minutes  Intravenous Every 1 hr x 2 10/25/24 0240 10/25/24 0511       Objective: Vitals:   10/27/24 0309 10/27/24 0758  BP: 125/82 123/84  Pulse: 66 71  Resp: 18   Temp: 97.7 F (36.5 C) 98.6 F (37 C)  SpO2: 99% 99%    Intake/Output Summary (Last 24 hours) at 10/27/2024 1337 Last data filed at 10/26/2024 1500 Gross per 24 hour  Intake 96.69 ml  Output --  Net 96.69 ml   Filed Weights   10/25/24 0158 10/25/24 1328  Weight: 108.9 kg 108.9 kg   Weight change:  Body mass index is 36.49 kg/m.   Physical Exam: General exam: Pleasant, middle-aged Caucasian male.  In some pain while wound was being dressed Skin: No rashes, lesions or ulcers. HEENT: Atraumatic, normocephalic, no obvious bleeding Lungs: Clear to auscultation bilaterally,  CVS: S1, S2, no murmur,   GI/Abd: Soft, nontender, nondistended, bowel sound present,   CNS: Alert, awake, oriented x 3 Psychiatry: Mood appropriate Extremities: No pedal edema, no calf tenderness, right hand on bandage.  Data Review: I have personally reviewed the laboratory data and studies available.  F/u labs ordered Unresulted Labs (From admission, onward)     Start     Ordered   11/02/24 0500  CK  Every Wednesday,   R      10/26/24 1012            Signed, Chapman Rota, MD [facility] 10/27/2024

## 2024-10-27 NOTE — Progress Notes (Signed)
 [facility]  Date of Admission:  10/25/2024      Total days of antibiotics 2   Daptomycin          ASSESSMENT: Scott Phillips is a 53 y.o. male admitted with:   Post Op Infection Trigger Finger Surgery -  Reported abrupt pain / fever within 48 hours of surgery. Operative report indicates purulence that tracked to tendon. Cultures with gram stain that indicates likely staph infection (GPC Pairs). Will narrow to daptomycin for now and plan course of oral antibiotics given soft tissue.  Would be a good candidate for linezolid.  - Daptomycin IV  - Follow pending micro - should have all susceptibilities for D/C tomorrow  - No plans for IV abx outside of the hospital   He prefers to wait after our discussion of possible treatment plans until we have final information for him. We will continue dapto today and hopefully be able to transition to oral option tomorrow. He was a little hesitant of linezoid when we discussed it d/t possible side effects.   PLAN: - Daptomycin IV  - Follow pending micro - should have all susceptibilities for D/C tomorrow  - No plans for IV abx outside of the hospital   Principal Problem:   Post op infection Active Problems:   Wound infection after surgery   Infection of tendon sheath   Therapeutic drug monitoring    acetaminophen   1,000 mg Oral TID   bacitracin   Topical TID   Chlorhexidine Gluconate Cloth  6 each Topical Daily   famotidine   20 mg Oral BID AC   melatonin  5 mg Oral QHS   mupirocin  ointment  1 Application Nasal BID   pantoprazole   40 mg Oral BID AC    SUBJECTIVE: Doing well. Pain controlled.  No fevers or chills. Doing well with abx he is getting for now.   Review of Systems: Review of Systems  Constitutional:  Negative for chills and fever.  Gastrointestinal:  Negative for abdominal pain, diarrhea, nausea and vomiting.    Allergies  Allergen Reactions   Milk-Related Compounds Other  (See Comments)    Sinus congestion     OBJECTIVE: Vitals:   10/26/24 1941 10/27/24 0306 10/27/24 0309 10/27/24 0758  BP: 134/72 123/66 125/82 123/84  Pulse: 72 75 66 71  Resp: 18 18 18    Temp: 98.4 F (36.9 C) 98.7 F (37.1 C) 97.7 F (36.5 C) 98.6 F (37 C)  TempSrc:      SpO2: 100% 99% 99% 99%  Weight:      Height:       Body mass index is 36.49 kg/m.  Physical Exam Constitutional:      Appearance: Normal appearance. He is not ill-appearing.  HENT:     Head: Normocephalic.     Mouth/Throat:     Mouth: Mucous membranes are moist.     Pharynx: Oropharynx is clear.  Eyes:     General: No scleral icterus. Cardiovascular:     Rate and Rhythm: Normal rate.  Pulmonary:     Effort: Pulmonary effort is normal.  Musculoskeletal:        General: Normal range of motion.     Cervical back: Normal range of motion.  Skin:    Coloration: Skin is not jaundiced or pale.  Neurological:     Mental Status: He is alert and oriented to person, place, and time.  Psychiatric:  Mood and Affect: Mood normal.        Judgment: Judgment normal.     Lab Results Lab Results  Component Value Date   WBC 10.5 10/27/2024   HGB 13.3 10/27/2024   HCT 37.0 (L) 10/27/2024   MCV 87.9 10/27/2024   PLT 265 10/27/2024    Lab Results  Component Value Date   CREATININE 0.99 10/27/2024   BUN 14 10/27/2024   NA 140 10/27/2024   K 4.7 10/27/2024   CL 103 10/27/2024   CO2 24 10/27/2024    Lab Results  Component Value Date   ALT 45 (H) 10/25/2024   AST 29 10/25/2024   ALKPHOS 60 10/25/2024   BILITOT 0.9 10/25/2024     Microbiology: Recent Results (from the past 240 hours)  Surgical pcr screen     Status: Abnormal   Collection Time: 10/25/24 11:52 AM   Specimen: Nasal Mucosa; Nasal Swab  Result Value Ref Range Status   MRSA, PCR NEGATIVE NEGATIVE Final   Staphylococcus aureus POSITIVE (A) NEGATIVE Final    Comment: (NOTE) The Xpert SA Assay (FDA approved for NASAL specimens  in patients 91 years of age and older), is one component of a comprehensive surveillance program. It is not intended to diagnose infection nor to guide or monitor treatment. Performed at [facility], [address], [place], [place] [address]   Aerobic Culture w Gram Stain (superficial specimen)     Status: None (Preliminary result)   Collection Time: 10/25/24  3:23 PM   Specimen: Wound  Result Value Ref Range Status   Specimen Description WOUND RIGHT FINGER  Final   Special Requests RT RING FINGER  Final   Gram Stain   Final    ABUNDANT WBC PRESENT, PREDOMINANTLY PMN MODERATE GRAM POSITIVE COCCI IN PAIRS    Culture   Final    MODERATE STAPHYLOCOCCUS AUREUS SUSCEPTIBILITIES TO FOLLOW Performed at [facility], [address], [place], [place] [address]    Report Status PENDING  Incomplete     Corean Fireman, MSN, NP-C [facility]  [email or web address] Pager: [phone] Office: [phone] [facility] Main Line: [phone] *Secure Chat Communication Welcome  Total Encounter Time: 10 m

## 2024-10-27 NOTE — Plan of Care (Signed)
  Problem: Education: Goal: Knowledge of General Education information will improve Description: Including pain rating scale, medication(s)/side effects and non-pharmacologic comfort measures Outcome: Adequate for Discharge   Problem: Clinical Measurements: Goal: Will remain free from infection Outcome: Adequate for Discharge   Problem: Activity: Goal: Risk for activity intolerance will decrease Outcome: Adequate for Discharge   Problem: Pain Managment: Goal: General experience of comfort will improve and/or be controlled Outcome: Adequate for Discharge

## 2024-10-28 ENCOUNTER — Other Ambulatory Visit (HOSPITAL_COMMUNITY): Payer: Self-pay

## 2024-10-28 DIAGNOSIS — T8149XD Infection following a procedure, other surgical site, subsequent encounter: Secondary | ICD-10-CM | POA: Diagnosis not present

## 2024-10-28 DIAGNOSIS — A4901 Methicillin susceptible Staphylococcus aureus infection, unspecified site: Secondary | ICD-10-CM | POA: Diagnosis not present

## 2024-10-28 DIAGNOSIS — Z5181 Encounter for therapeutic drug level monitoring: Secondary | ICD-10-CM | POA: Diagnosis not present

## 2024-10-28 DIAGNOSIS — M6510 Other infective (teno)synovitis, unspecified site: Secondary | ICD-10-CM | POA: Diagnosis not present

## 2024-10-28 DIAGNOSIS — T8142XA Infection following a procedure, deep incisional surgical site, initial encounter: Secondary | ICD-10-CM | POA: Diagnosis not present

## 2024-10-28 LAB — AEROBIC CULTURE W GRAM STAIN (SUPERFICIAL SPECIMEN)

## 2024-10-28 MED ORDER — ACETAMINOPHEN 500 MG PO TABS: 1000.0000 mg | ORAL_TABLET | Freq: Three times a day (TID) | ORAL | Status: DC

## 2024-10-28 MED ORDER — CEFADROXIL 500 MG PO CAPS
1000.0000 mg | ORAL_CAPSULE | Freq: Two times a day (BID) | ORAL | 0 refills | Status: AC
  Filled 2024-10-28: qty 72, 18d supply, fill #0

## 2024-10-28 MED ORDER — BACITRACIN ZINC 500 UNIT/GM EX OINT
TOPICAL_OINTMENT | Freq: Three times a day (TID) | CUTANEOUS | 0 refills | Status: DC
  Filled 2024-10-28: qty 28.4, 5d supply, fill #0

## 2024-10-28 NOTE — Progress Notes (Signed)
 Occupational Therapy Treatment Patient Details Name: Scott Phillips MRN: [id number] DOB: 06-Sep-1971 Today's Date: 10/28/2024   History of present illness Pt is a 53 y.o. male who presented 10/25/24 due to Pt s/p R index finger flexor tendon sheath I&D 10/25/24. PMH: GERD, allergic sinusitis, depression and recent OP treatment for R index A1 pulley release on 10/20/2024 and then developed pain on 10/22/24 and then was seen in OP 10/27 and was started on antibiotics.   OT comments  Pt making good progress with functional goals. Pt standing in room at sink counter upon OT arrival. Reviewed finger ROM and hand elevation for edema/pain mgt, pt able to verbalize and demo understanding. Pt requires set up/Mod I with eating meals due to R hand ROM and WBng restrictions. Mod I with bathing, dressing, toileting. Reviewed ADL/selfcare safety compensatory strategies and modifications; pt verbalized understanding. Pt eager to d/c home today      If plan is discharge home, recommend the following:  Assistance with cooking/housework;Assist for transportation   Equipment Recommendations  None recommended by OT    Recommendations for Other Services      Precautions / Restrictions Precautions Precautions: Fall Recall of Precautions/Restrictions: Intact Restrictions Weight Bearing Restrictions Per Provider Order: Yes RUE Weight Bearing Per Provider Order: Non weight bearing Other Position/Activity Restrictions: able to complete ROM within limits of bandages       Mobility Bed Mobility Overal bed mobility: Modified Independent                  Transfers Overall transfer level: Modified independent Equipment used: None               General transfer comment: pt standing at sink in room upon OT arrival     Balance Overall balance assessment: Modified Independent                                         ADL either performed or assessed with clinical judgement    ADL                                         General ADL Comments: pt requires set up with eating meals due to R hand AROM and WBng restrictions. Mod I with bathing, dressing, toileting. Reviewed ADL/selfcare safety    Extremity/Trunk Assessment Upper Extremity Assessment Upper Extremity Assessment: Right hand dominant;RUE deficits/detail RUE Deficits / Details: Pt s/p R index finger tendon sheath and I&D and has banages on hand with the tip of R index finger poking out and able to view DIPS and PIPS of all other joints but not MCPS. Pt had trace movemants in R index finger and limited AROM in all other joints due to edema       Cervical / Trunk Assessment Cervical / Trunk Assessment: Normal    Vision Ability to See in Adequate Light: 0 Adequate Patient Visual Report: No change from baseline Vision Assessment?: Wears glasses for reading   Perception     Praxis     Communication Communication Communication: No apparent difficulties   Cognition Arousal: Alert Behavior During Therapy: WFL for tasks assessed/performed Cognition: No apparent impairments  Following commands: Intact        Cueing      Exercises Other Exercises Other Exercises: reviewed finger ROM and hand elevation for edema/pain mgt, pt able to verbalize and demo understanding.    Shoulder Instructions       General Comments      Pertinent Vitals/ Pain       Pain Assessment Pain Assessment: Faces Faces Pain Scale: Hurts a little bit Pain Descriptors / Indicators: Discomfort Pain Intervention(s): Monitored during session, Premedicated before session, Repositioned, Ice applied  Home Living                                          Prior Functioning/Environment              Frequency  Min 2X/week        Progress Toward Goals  OT Goals(current goals can now be found in the care plan section)  Progress towards  OT goals: Progressing toward goals     Plan      Co-evaluation                 AM-PAC OT 6 Clicks Daily Activity     Outcome Measure   Help from another person eating meals?: None (set up) Help from another person taking care of personal grooming?: None Help from another person toileting, which includes using toliet, bedpan, or urinal?: None Help from another person bathing (including washing, rinsing, drying)?: None Help from another person to put on and taking off regular upper body clothing?: None Help from another person to put on and taking off regular lower body clothing?: None 6 Click Score: 24    End of Session Equipment Utilized During Treatment: Other (comment) (none)  OT Visit Diagnosis: Muscle weakness (generalized) (M62.81);Pain Pain - Right/Left: Right Pain - part of body: Hand   Activity Tolerance Patient tolerated treatment well   Patient Left in bed;with call bell/phone within reach   Nurse Communication Mobility status        Time: 1001-1017 OT Time Calculation (min): 16 min  Charges: OT General Charges $OT Visit: 1 Visit    Jacques Karna Loose 10/28/2024, 11:30 AM

## 2024-10-28 NOTE — Discharge Summary (Signed)
 Physician Discharge Summary  Scott Phillips FMW:[id number] DOB: February 25, 1971 DOA: 10/25/2024  PCP: Amon Aloysius BRAVO, MD  Admit date: 10/25/2024 Discharge date: 10/28/2024  Admitted from: Home Discharge disposition: Home  Recommendations at discharge:  Complete 3 weeks course of antibiotics with cefadroxil 1000 mg twice daily, end of treatment on 11/15/2024 You have a follow-up appointment with ID on 11/18/2024 Follow-up with hand surgery as an outpatient  Subjective: Patient was seen and examined this morning. Sitting up in bed.  Not in distress.  No new symptoms. Ready for discharge. Work note given as requested  Brief narrative: Scott Phillips is a 53 y.o. male with PMH significant for obesity, OSA, GERD, depression who underwent right index trigger finger release as an outpatient recently on 10/23. 10/28, patient presented to the ED with complaint of pain, swelling of the right index finger progressive for the last few days since surgery. On the night prior to presentation, spiked a fever of 101.4 and hence presented to ED  Admitted to [facility] for evaluation by hand surgery 10/28, patient underwent right index finger flexor tendon sheath I&D by Dr. Alyse Salvage course: R index finger postop infection MSSA in wound culture Had right index trigger finger release as an outpatient recently on 10/23. Presented with postop infection S/p I&D of right index finger flexor tendon sheath -Dr. Alyse -10/28.  Operative report indicates purulence that tracked to the tendon. Currently on empiric IV daptomycin Culture from OR is grew MSSA, resistant to doxycycline . Per ID recommendation, will discharge on 3 weeks of cefadroxil 1000 mg twice daily, EOT 11/15/2024 Has a follow-up appointment with ID on 11/18/2024 Currently pain controlled on Tylenol  only  GERD famotidine  20mg  BID  Goals of care   Code Status: Full Code   Diet:  Diet Order             Diet general            Diet regular Room service appropriate? Yes; Fluid consistency: Thin  Diet effective now                   Nutritional status:  Body mass index is 36.49 kg/m.       Wounds:  - Wound 10/25/24 1545 Surgical Closed Surgical Incision Hand (Active)  Date First Assessed/Time First Assessed: 10/25/24 1545   Primary Wound Type: Surgical  Secondary Wound Type - Surgical: Closed Surgical Incision  Location: Hand    Assessments 10/25/2024  4:00 PM 10/28/2024  9:33 AM  Site / Wound Assessment Dressing in place / Unable to assess Dressing in place / Unable to assess  Drainage Amount None --  Dressing Type Compression wrap --  Dressing Status Clean, Dry, Intact Clean, Dry, Intact     No associated orders.    Discharge Medications:   Allergies as of 10/28/2024       Reactions   Milk-related Compounds Other (See Comments)   Sinus congestion         Medication List     STOP taking these medications    doxycycline  100 MG tablet Commonly known as: VIBRA -TABS       TAKE these medications    acetaminophen  500 MG tablet Commonly known as: TYLENOL  Take 2 tablets (1,000 mg total) by mouth 3 (three) times daily.   b complex vitamins tablet Take 1 tablet by mouth daily.   CALCIUM-MAGNESUIUM-ZINC PO Take 1 tablet by mouth daily.   cefadroxil 500 MG capsule Commonly known as: DURICEF Take 2  capsules (1,000 mg total) by mouth 2 (two) times daily for 18 days.   cetirizine 10 MG tablet Commonly known as: ZYRTEC Take 10 mg by mouth daily.   fluticasone 50 MCG/ACT nasal spray Commonly known as: FLONASE Place 1 spray into both nostrils daily.   FT Antibiotic ointment Generic drug: bacitracin Apply topically 3 (three) times daily.   METAMUCIL FIBER PO Take 1 capsule by mouth See admin instructions. Take 1 capsule 2-4 times daily.   Multivitamin Men Tabs Take 1 tablet by mouth daily.   oxyCODONE 5 MG immediate release tablet Commonly known as: Oxy IR/ROXICODONE Take  5 mg by mouth every 6 (six) hours as needed for severe pain (pain score 7-10).   pantoprazole  40 MG tablet Commonly known as: PROTONIX  Take 1 tablet (40 mg total) by mouth 2 (two) times daily before a meal.   PROBIOTIC PO Take 1 capsule by mouth daily.   TURMERIC (CURCUMIN) PO Take 1 tablet by mouth 2 (two) times daily.   VITAMIN D-3 PO Take 1 capsule by mouth daily.               Discharge Care Instructions  (From admission, onward)           Start     Ordered   10/28/24 0000  Discharge wound care:        10/28/24 1003             Follow ups:    Follow-up Information     [facility] [place] Follow up on 11/18/2024.   Specialty: Infectious Diseases Why: hospital follow up with Corean Fireman, NP @ 9:45 am to check in on antibiotics and infection Contact information: [address] [place], [address] [place]   [address] [phone]        Amon Aloysius BRAVO, MD Follow up.   Specialty: Internal Medicine Contact information: [address] [place] [address] [phone]                 Discharge Instructions:   Discharge Instructions     Call MD for:  difficulty breathing, headache or visual disturbances   Complete by: As directed    Call MD for:  extreme fatigue   Complete by: As directed    Call MD for:  hives   Complete by: As directed    Call MD for:  persistant dizziness or light-headedness   Complete by: As directed    Call MD for:  persistant nausea and vomiting   Complete by: As directed    Call MD for:  severe uncontrolled pain   Complete by: As directed    Call MD for:  temperature >100.4   Complete by: As directed    Diet general   Complete by: As directed    Discharge instructions   Complete by: As directed    Recommendations at discharge:   Complete 3 weeks course of antibiotics with cefadroxil 1000 mg twice daily, end of treatment  on 11/15/2024  You have a follow-up appointment with ID on 11/18/2024  Follow-up with hand surgery as an outpatient  General discharge instructions: Follow with Primary MD Amon Aloysius BRAVO, MD in 7 days  Please request your PCP  to go over your hospital tests, procedures, radiology results at the follow up. Please get your medicines reviewed and adjusted.  Your PCP may decide to repeat certain labs or tests as  needed. Do not drive, operate heavy machinery, perform activities at heights, swimming or participation in water activities or provide baby sitting services if your were admitted for syncope or siezures until you have seen by Primary MD or a Neurologist and advised to do so again. [place]  Controlled Substance Reporting System database was reviewed. Do not drive, operate heavy machinery, perform activities at heights, swim, participate in water activities or provide baby-sitting services while on medications for pain, sleep and mood until your outpatient physician has reevaluated you and advised to do so again.  You are strongly recommended to comply with the dose, frequency and duration of prescribed medications. Activity: As tolerated with Full fall precautions use walker/cane & assistance as needed Avoid using any recreational substances like cigarette, tobacco, alcohol, or non-prescribed drug. If you experience worsening of your admission symptoms, develop shortness of breath, life threatening emergency, suicidal or homicidal thoughts you must seek medical attention immediately by calling 911 or calling your MD immediately  if symptoms less severe. You must read complete instructions/literature along with all the possible adverse reactions/side effects for all the medicines you take and that have been prescribed to you. Take any new medicine only after you have completely understood and accepted all the possible adverse reactions/side effects.  Wear Seat belts while driving. You were cared  for by a hospitalist during your hospital stay. If you have any questions about your discharge medications or the care you received while you were in the hospital after you are discharged, you can call the unit and ask to speak with the hospitalist or the covering physician. Once you are discharged, your primary care physician will handle any further medical issues. Please note that NO REFILLS for any discharge medications will be authorized once you are discharged, as it is imperative that you return to your primary care physician (or establish a relationship with a primary care physician if you do not have one).   Discharge wound care:   Complete by: As directed    Increase activity slowly   Complete by: As directed        Discharge Exam:   Vitals:   10/27/24 0758 10/27/24 1925 10/28/24 0340 10/28/24 0834  BP: 123/84 130/80 (!) 143/85 (!) 144/81  Pulse: 71 73 70 70  Resp:  18 16 18   Temp: 98.6 F (37 C) 98.7 F (37.1 C) 98.3 F (36.8 C) 98.3 F (36.8 C)  TempSrc:  Oral  Oral  SpO2: 99% 99% 99% 96%  Weight:      Height:        Body mass index is 36.49 kg/m.  General exam: Pleasant, middle-aged Caucasian male.  Not in distress Skin: No rashes, lesions or ulcers. HEENT: Atraumatic, normocephalic, no obvious bleeding Lungs: Clear to auscultation bilaterally,  CVS: S1, S2, no murmur,   GI/Abd: Soft, nontender, nondistended, bowel sound present,   CNS: Alert, awake, oriented x 3 Psychiatry: Mood appropriate Extremities: No pedal edema, no calf tenderness, right hand on bandage.   The results of significant diagnostics from this hospitalization (including imaging, microbiology, ancillary and laboratory) are listed below for reference.    Procedures and Diagnostic Studies:   DG Chest 2 View Result Date: 10/25/2024 EXAM: 2 VIEW(S) XRAY OF THE CHEST 10/25/2024 02:31:00 AM COMPARISON: 09/19/2024 CLINICAL HISTORY: Surgery for trigger finger on 10/20/24, patient followed up with  surgeon on 10/24/24 with concerns for possible infection. Patient was prescribed Doxycycline . Pt developed a fever yesterday. FINDINGS: LUNGS AND PLEURA: No focal pulmonary  opacity. No pulmonary edema. No pleural effusion. No pneumothorax. HEART AND MEDIASTINUM: No acute abnormality of the cardiac and mediastinal silhouettes. BONES AND SOFT TISSUES: No acute osseous abnormality. IMPRESSION: 1. No acute process. Electronically signed by: Dorethia Molt MD 10/25/2024 02:43 AM EDT RP Workstation: [id number]     Labs:   Basic Metabolic Panel: Recent Labs  Lab 10/25/24 0202 10/27/24 0418  NA 137 140  K 3.9 4.7  CL 100 103  CO2 24 24  GLUCOSE 130* 101*  BUN 11 14  CREATININE 1.01 0.99  CALCIUM 9.2 9.0   GFR Estimated Creatinine Clearance: 106.8 mL/min (by C-G formula based on SCr of 0.99 mg/dL). Liver Function Tests: Recent Labs  Lab 10/25/24 0202  AST 29  ALT 45*  ALKPHOS 60  BILITOT 0.9  PROT 7.3  ALBUMIN 4.2   No results for input(s): LIPASE, AMYLASE in the last 168 hours. No results for input(s): AMMONIA in the last 168 hours. Coagulation profile No results for input(s): INR, PROTIME in the last 168 hours.  CBC: Recent Labs  Lab 10/25/24 0202 10/26/24 0920 10/27/24 0418  WBC 11.0* 17.5* 10.5  NEUTROABS 7.8* 14.4* 6.5  HGB 13.6 13.6 13.3  HCT 37.9* 38.1* 37.0*  MCV 87.3 85.8 87.9  PLT 213 284 265   Cardiac Enzymes: Recent Labs  Lab 10/26/24 1010  CKTOTAL 100   BNP: Invalid input(s): POCBNP CBG: Recent Labs  Lab 10/25/24 1156 10/26/24 0821  GLUCAP 90 133*   D-Dimer No results for input(s): DDIMER in the last 72 hours. Hgb A1c No results for input(s): HGBA1C in the last 72 hours. Lipid Profile No results for input(s): CHOL, HDL, LDLCALC, TRIG, CHOLHDL, LDLDIRECT in the last 72 hours. Thyroid function studies No results for input(s): TSH, T4TOTAL, T3FREE, THYROIDAB in the last 72 hours.  Invalid input(s):  FREET3 Anemia work up No results for input(s): VITAMINB12, FOLATE, FERRITIN, TIBC, IRON, RETICCTPCT in the last 72 hours. Microbiology Recent Results (from the past 240 hours)  Surgical pcr screen     Status: Abnormal   Collection Time: 10/25/24 11:52 AM   Specimen: Nasal Mucosa; Nasal Swab  Result Value Ref Range Status   MRSA, PCR NEGATIVE NEGATIVE Final   Staphylococcus aureus POSITIVE (A) NEGATIVE Final    Comment: (NOTE) The Xpert SA Assay (FDA approved for NASAL specimens in patients 44 years of age and older), is one component of a comprehensive surveillance program. It is not intended to diagnose infection nor to guide or monitor treatment. Performed at [facility], [address] [place], [place] [address]   Aerobic Culture w Gram Stain (superficial specimen)     Status: None   Collection Time: 10/25/24  3:23 PM   Specimen: Wound  Result Value Ref Range Status   Specimen Description WOUND RIGHT FINGER  Final   Special Requests RT RING FINGER  Final   Gram Stain   Final    ABUNDANT WBC PRESENT, PREDOMINANTLY PMN MODERATE GRAM POSITIVE COCCI IN PAIRS Performed at [facility], [address] [place] [address] [place], [place] [address]    Culture MODERATE STAPHYLOCOCCUS AUREUS  Final   Report Status 10/28/2024 FINAL  Final   Organism ID, Bacteria STAPHYLOCOCCUS AUREUS  Final      Susceptibility   Staphylococcus aureus - MIC*    CIPROFLOXACIN  >=8 RESISTANT Resistant     ERYTHROMYCIN <=0.25 SENSITIVE Sensitive     GENTAMICIN <=0.5 SENSITIVE Sensitive     OXACILLIN 0.5 SENSITIVE Sensitive     TETRACYCLINE >=  16 RESISTANT Resistant     VANCOMYCIN 1 SENSITIVE Sensitive     TRIMETH /SULFA  <=10 SENSITIVE Sensitive     CLINDAMYCIN <=0.25 SENSITIVE Sensitive     RIFAMPIN <=0.5 SENSITIVE Sensitive     Inducible Clindamycin NEGATIVE Sensitive     LINEZOLID 2 SENSITIVE Sensitive     * MODERATE STAPHYLOCOCCUS AUREUS    Time coordinating discharge: 45  minutes  Signed: Kalem Rockwell  [facility] 10/28/2024, 11:53 AM

## 2024-10-28 NOTE — Plan of Care (Signed)
  Problem: Pain Managment: Goal: General experience of comfort will improve and/or be controlled Outcome: Progressing   Problem: Safety: Goal: Ability to remain free from injury will improve Outcome: Progressing

## 2024-10-28 NOTE — Progress Notes (Signed)
 Post op right index finger flexor tendon sheath I&D Patient doing well, up walking No systemic symptoms Incisions healing well no drainage or surrounding erythema  Significant stiffness with flexion  Sensation intact to light touch Finger warm and well perfused ID provider of bedside, plan for oral antibiotics outpatient  Dressing changed and soak done Patient ready for discharge on oral abx per ID, dial soap soaks at home three times a day, dressing changes reviewed, elevation, and finger range motion. Plan to see patient in office early next week. He will call for this appointment.

## 2024-10-28 NOTE — Progress Notes (Addendum)
 ID attending addendum  I came and saw the patient at the bedside while he was having his right hand cleaned and I been with water under guidance of orthopedic surgery.  Fortunately he has an MSSA as the culprit for his infection.  We will switch him over to high-dose cefadroxil and give him sufficient antibiotics bleed 3 weeks of postoperative treatment along with a an appointment with us  to check on how he is doing.  I personally spent a total of 50 minutes in the care of the patient today including preparing to see the patient, getting/reviewing separately obtained history, performing a medically appropriate exam/evaluation, counseling and educating, placing orders, referring and communicating with other health care professionals, communicating results, and coordinating care.   Evaluation of the patient requires complex antimicrobial therapy evaluation, counseling , isolation needs to reduce disease transmission and risk assessment and mitigation.              [facility]  Date of Admission:  10/25/2024      Total days of antibiotics 3   Daptomycin          ASSESSMENT: Scott Phillips is a 53 y.o. male admitted with:   Post Op Infection Trigger Finger Surgery -  MSSA -  Reported abrupt pain / fever within 48 hours of surgery. Operative report indicates purulence that tracked to tendon. Cultures with gram stain that indicates likely staph infection ([facility] Pairs). Narrowed to daptomycin. Final cultures with Doxycycline  resistant MSSA. Will send out on cefadroxil to complete 3 weeks of antibiotics.  - cefadroxil 1000 mg (2 caps) twice a day with EOT Nov 18th   MSSA Colonization -  Will discuss outpatient more procedures for future elective surgeries / procedures regarding decolonization procedure.  stephanie OK for D/C from ID perspective. D/W Dr. Arlice and Dr. Fleeta Rothman   PLAN: Stop daptomycin  Complete 3 weeks of treatment with Cefadroxil 1000 mg  BID  ID appt with me on Nov 18 2024 @ 9:45 am to ensure well treated     Principal Problem:   Post op infection Active Problems:   Wound infection after surgery   Infection of tendon sheath   Therapeutic drug monitoring    acetaminophen   1,000 mg Oral TID   bacitracin   Topical TID   Chlorhexidine Gluconate Cloth  6 each Topical Daily   famotidine   20 mg Oral BID AC   melatonin  5 mg Oral QHS   mupirocin  ointment  1 Application Nasal BID   pantoprazole   40 mg Oral BID AC    SUBJECTIVE: Cultures back with MSSA   Review of Systems: Review of Systems  Constitutional:  Negative for chills and fever.  Gastrointestinal:  Negative for abdominal pain, diarrhea, nausea and vomiting.    Allergies  Allergen Reactions   Milk-Related Compounds Other (See Comments)    Sinus congestion     OBJECTIVE: Vitals:   10/27/24 0309 10/27/24 0758 10/27/24 1925 10/28/24 0340  BP: 125/82 123/84 130/80 (!) 143/85  Pulse: 66 71 73 70  Resp: 18  18 16   Temp: 97.7 F (36.5 C) 98.6 F (37 C) 98.7 F (37.1 C) 98.3 F (36.8 C)  TempSrc:   Oral   SpO2: 99% 99% 99% 99%  Weight:      Height:       Body mass index is 36.49 kg/m.  Physical Exam  Lab Results Lab Results  Component Value Date   WBC 10.5 10/27/2024  HGB 13.3 10/27/2024   HCT 37.0 (L) 10/27/2024   MCV 87.9 10/27/2024   PLT 265 10/27/2024    Lab Results  Component Value Date   CREATININE 0.99 10/27/2024   BUN 14 10/27/2024   NA 140 10/27/2024   K 4.7 10/27/2024   CL 103 10/27/2024   CO2 24 10/27/2024    Lab Results  Component Value Date   ALT 45 (H) 10/25/2024   AST 29 10/25/2024   ALKPHOS 60 10/25/2024   BILITOT 0.9 10/25/2024     Microbiology: Recent Results (from the past 240 hours)  Surgical pcr screen     Status: Abnormal   Collection Time: 10/25/24 11:52 AM   Specimen: Nasal Mucosa; Nasal Swab  Result Value Ref Range Status   MRSA, PCR NEGATIVE NEGATIVE Final   Staphylococcus aureus POSITIVE (A)  NEGATIVE Final    Comment: (NOTE) The Xpert SA Assay (FDA approved for NASAL specimens in patients 80 years of age and older), is one component of a comprehensive surveillance program. It is not intended to diagnose infection nor to guide or monitor treatment. Performed at [facility], [address], [place], [place] [address]   Aerobic Culture w Gram Stain (superficial specimen)     Status: None   Collection Time: 10/25/24  3:23 PM   Specimen: Wound  Result Value Ref Range Status   Specimen Description WOUND RIGHT FINGER  Final   Special Requests RT RING FINGER  Final   Gram Stain   Final    ABUNDANT WBC PRESENT, PREDOMINANTLY PMN MODERATE GRAM POSITIVE COCCI IN PAIRS Performed at [facility], [address] [place], [place] [address]    Culture MODERATE STAPHYLOCOCCUS AUREUS  Final   Report Status 10/28/2024 FINAL  Final   Organism ID, Bacteria STAPHYLOCOCCUS AUREUS  Final      Susceptibility   Staphylococcus aureus - MIC*    CIPROFLOXACIN  >=8 RESISTANT Resistant     ERYTHROMYCIN <=0.25 SENSITIVE Sensitive     GENTAMICIN <=0.5 SENSITIVE Sensitive     OXACILLIN 0.5 SENSITIVE Sensitive     TETRACYCLINE >=16 RESISTANT Resistant     VANCOMYCIN 1 SENSITIVE Sensitive     TRIMETH /SULFA  <=10 SENSITIVE Sensitive     CLINDAMYCIN <=0.25 SENSITIVE Sensitive     RIFAMPIN <=0.5 SENSITIVE Sensitive     Inducible Clindamycin NEGATIVE Sensitive     LINEZOLID 2 SENSITIVE Sensitive     * MODERATE STAPHYLOCOCCUS AUREUS     Corean Fireman, MSN, NP-C [facility]  [email or web address] Pager: [phone] Office: [phone] [facility] Main Line: [phone] *Secure Chat Communication Welcome  Total Encounter Time: 10 m

## 2024-11-02 ENCOUNTER — Encounter: Payer: BC Managed Care – PPO | Admitting: Internal Medicine

## 2024-11-03 MED FILL — Morphine Sulfate Inj 4 MG/ML: INTRAMUSCULAR | Qty: 1 | Status: AC

## 2024-11-07 ENCOUNTER — Telehealth: Payer: Self-pay

## 2024-11-07 NOTE — Telephone Encounter (Signed)
 Per [place]-  Since I have not seen pt I think better for her to get labs on day of visit. Come in fasting her appointment is early am Otherwise would only order cbc, cmp and lipid panel. May order other labs as I  don't know her medical history.

## 2024-11-07 NOTE — Telephone Encounter (Signed)
 Pt scheduled w/ you on 11/30/24 for CPX in Dr. Berdine absence. Please advise?

## 2024-11-07 NOTE — Telephone Encounter (Signed)
 Copied from CRM [id number]. Topic: Clinical - Request for Lab/Test Order >> Nov 07, 2024 12:10 PM Suzen RAMAN wrote: Reason for CRM: Patient would like to have labs completed prior to physical. Please contact patient to schedule once orders are placed.   CB#[phone]

## 2024-11-07 NOTE — Telephone Encounter (Signed)
 LMOM informing Pt we can do labs prior to visit but made him aware that he may need to be stuck again after visit if further labs is needed. Asked that he call back w/ his preference.

## 2024-11-08 ENCOUNTER — Encounter: Payer: Self-pay | Admitting: Internal Medicine

## 2024-11-08 DIAGNOSIS — M25641 Stiffness of right hand, not elsewhere classified: Secondary | ICD-10-CM | POA: Diagnosis not present

## 2024-11-09 DIAGNOSIS — B353 Tinea pedis: Secondary | ICD-10-CM | POA: Diagnosis not present

## 2024-11-09 DIAGNOSIS — L814 Other melanin hyperpigmentation: Secondary | ICD-10-CM | POA: Diagnosis not present

## 2024-11-09 DIAGNOSIS — D369 Benign neoplasm, unspecified site: Secondary | ICD-10-CM | POA: Diagnosis not present

## 2024-11-09 DIAGNOSIS — D1801 Hemangioma of skin and subcutaneous tissue: Secondary | ICD-10-CM | POA: Diagnosis not present

## 2024-11-11 DIAGNOSIS — M25641 Stiffness of right hand, not elsewhere classified: Secondary | ICD-10-CM | POA: Diagnosis not present

## 2024-11-15 DIAGNOSIS — M25641 Stiffness of right hand, not elsewhere classified: Secondary | ICD-10-CM | POA: Diagnosis not present

## 2024-11-18 ENCOUNTER — Other Ambulatory Visit: Payer: Self-pay

## 2024-11-18 ENCOUNTER — Encounter: Payer: Self-pay | Admitting: Infectious Diseases

## 2024-11-18 ENCOUNTER — Ambulatory Visit (INDEPENDENT_AMBULATORY_CARE_PROVIDER_SITE_OTHER): Admitting: Infectious Diseases

## 2024-11-18 VITALS — BP 121/71 | HR 77 | Temp 97.3°F | Resp 16 | Wt 238.0 lb

## 2024-11-18 DIAGNOSIS — M6510 Other infective (teno)synovitis, unspecified site: Secondary | ICD-10-CM

## 2024-11-18 DIAGNOSIS — M25641 Stiffness of right hand, not elsewhere classified: Secondary | ICD-10-CM | POA: Diagnosis not present

## 2024-11-18 DIAGNOSIS — B9561 Methicillin susceptible Staphylococcus aureus infection as the cause of diseases classified elsewhere: Secondary | ICD-10-CM | POA: Diagnosis not present

## 2024-11-18 DIAGNOSIS — T8142XA Infection following a procedure, deep incisional surgical site, initial encounter: Secondary | ICD-10-CM

## 2024-11-18 MED ORDER — MUPIROCIN 2 % EX OINT: 1.0000 | TOPICAL_OINTMENT | Freq: Two times a day (BID) | CUTANEOUS | 1 refills | Status: DC

## 2024-11-18 NOTE — Progress Notes (Signed)
 Patient: Scott Phillips  DOB: 07-22-1971 MRN: [id number] PCP: Amon Aloysius BRAVO, MD    Chief Complaint  Patient presents with   Hospitalization Follow-up    Post op hand infection       Subjective   Subjective:  Discussed the use of AI scribe software for clinical note transcription with the patient, who gave verbal consent to proceed.  History of Present Illness   Scott Phillips is a 53 year old male who presents for a follow-up after a surgical site infection involving his hand.  He developed a surgical site infection following a trigger finger repair, caused by Methicillin-sensitive Staphylococcus aureus (MSSA). He was treated with a high dose of cephalexin for three weeks after a period of IV antibiotics in the hospital, which he completed four days ago. Swelling has decreased significantly, although it returns if the compression wrap is left off. He experienced mild digestive side effects towards the end of the antibiotic course, which have since resolved.  The redness in the affected area has remained constant, and the skin has peeled, leaving it sensitive. He is unable to bend the finger well due to the tendon hitting a knot of scar tissue, which is being addressed through physical therapy and scar massage. The pain is now described as 'arthritic pain' in the joints and bones, different from the initial post-surgical infection pain. There has not been any drainage to the skin in a while and when it did drain it was thin bloody material.   He has been seeing his hand surgeon regularly and has not experienced any signs of infection such as warmth, drainage, or fever. The skin is very sensitive, and the peeling was thick.  He has a history of multiple trigger finger releases and is familiar with the recovery process, noting that this recovery is taking longer due to the presence of two layers of scar tissue. He has been undergoing aggressive massage therapy twice a week to aid in  recovery.       ROS  Past Medical History:  Diagnosis Date   Allergic rhinosinusitis    Allergy    Arthritis    Colon polyps    Depression    Diverticulitis    GERD (gastroesophageal reflux disease)    Sinusitis    Sleep apnea     Outpatient Medications Prior to Visit  Medication Sig Dispense Refill   b complex vitamins tablet Take 1 tablet by mouth daily.     Calcium-Magnesium-Zinc  (CALCIUM-MAGNESUIUM-ZINC  PO) Take 1 tablet by mouth daily.     cetirizine (ZYRTEC) 10 MG tablet Take 10 mg by mouth daily.     Cholecalciferol (VITAMIN D-3 PO) Take 1 capsule by mouth daily.     fluticasone (FLONASE) 50 MCG/ACT nasal spray Place 1 spray into both nostrils daily.     METAMUCIL FIBER PO Take 1 capsule by mouth See admin instructions. Take 1 capsule 2-4 times daily.     Misc Natural Products (TURMERIC, CURCUMIN, PO) Take 1 tablet by mouth 2 (two) times daily.     Multiple Vitamins-Minerals (MULTIVITAMIN MEN) TABS Take 1 tablet by mouth daily.     naproxen sodium (ALEVE) 220 MG tablet Take 220 mg by mouth.     oxyCODONE  (OXY IR/ROXICODONE ) 5 MG immediate release tablet Take 5 mg by mouth every 6 (six) hours as needed for severe pain (pain score 7-10).     pantoprazole  (PROTONIX ) 40 MG tablet Take 1 tablet (40 mg total) by mouth  2 (two) times daily before a meal. 180 tablet 2   Probiotic Product (PROBIOTIC PO) Take 1 capsule by mouth daily.     acetaminophen  (TYLENOL ) 500 MG tablet Take 2 tablets (1,000 mg total) by mouth 3 (three) times daily.     bacitracin  ointment Apply topically 3 (three) times daily. 120 g 0   No facility-administered medications prior to visit.     Allergies  Allergen Reactions   Milk-Related Compounds Other (See Comments)    Sinus congestion     Social History   Tobacco Use   Smoking status: Former    Current packs/day: 0.00    Average packs/day: 2.0 packs/day for 23.0 years (46.0 ttl pk-yrs)    Types: Cigarettes    Start date: 12/30/1991    Quit  date: 12/29/2014    Years since quitting: 9.8   Smokeless tobacco: Never  Vaping Use   Vaping status: Never Used  Substance Use Topics   Alcohol use: Yes    Comment: occas   Drug use: Never       Objective   Objective:   Vitals:   11/18/24 0943 11/18/24 0944  BP:  121/71  Pulse:  77  Resp:  16  Temp:  (!) 97.3 F (36.3 C)  TempSrc:  Temporal  SpO2:  95%  Weight: 238 lb (108 kg)    Body mass index is 36.19 kg/m.  Physical Exam Constitutional:      Appearance: Normal appearance.  Cardiovascular:     Rate and Rhythm: Normal rate.  Musculoskeletal:     Comments: Right hand inspected - small scab remains in the incision. Dense scar tissue palpated around incision site. Tender. No warmth. ROM is limited.  Neurological:     Mental Status: He is alert.        Assessment & Plan:     Postoperative hand infection due to MSSA after trigger finger repair - Completed a three-week course of high-dose cephalexin. Swelling has decreased, but redness and sensitivity persist. No signs of active infection such as warmth, pus, or increased pain. There is no fluctuance and on palpation feels like dense scar tissue. Pain is now arthritic rather than acute postoperative pain. No fever or chills reported. Infection is not subtle and will show if it recurs. - Stop antibiotics. - Monitor for signs of infection such as increased pain, warmth, or drainage. - Contact infectious disease team if concerns arise.  Staph Aureus Colonization -  Discussed decolonization protocol to implement 5 days before any elective procedure to include daily hibiclens  scrubs and mupirocin  ointment applications to nares twice a day.  - Use Hibiclens  for skin cleansing before future surgeries. - Apply mupirocin  intranasally before future surgeries to reduce staph colonization.    No orders of the defined types were placed in this encounter.   Meds ordered this encounter  Medications   mupirocin  ointment  (BACTROBAN ) 2 %    Sig: Apply 1 Application topically 2 (two) times daily.    Dispense:  30 g    Refill:  1    Return if symptoms worsen or fail to improve.   Corean Fireman, MSN, NP-C [facility]  [email or web address] Pager: [phone] Office: [phone] [facility] Main Line: [phone] *Secure Chat Communication Welcome

## 2024-11-18 NOTE — Patient Instructions (Addendum)
 Dial, Dove sensitive skin > soaps that are good for the skin  Keep an eye out for any changes that indicate recurrent infection.   Hibiclens  prior to any future elective surgery to decrease bacteria on the skin (can get this over the counter at the pharmacy)  Once a day for 3-5 days before surgery   Mupirocin  ointment thin layer to the inside of your nose twice a day 5 days before surgery.

## 2024-11-22 DIAGNOSIS — M25641 Stiffness of right hand, not elsewhere classified: Secondary | ICD-10-CM | POA: Diagnosis not present

## 2024-11-23 DIAGNOSIS — M609 Myositis, unspecified: Secondary | ICD-10-CM | POA: Diagnosis not present

## 2024-11-23 DIAGNOSIS — M9902 Segmental and somatic dysfunction of thoracic region: Secondary | ICD-10-CM | POA: Diagnosis not present

## 2024-11-23 DIAGNOSIS — M9903 Segmental and somatic dysfunction of lumbar region: Secondary | ICD-10-CM | POA: Diagnosis not present

## 2024-11-23 DIAGNOSIS — M25641 Stiffness of right hand, not elsewhere classified: Secondary | ICD-10-CM | POA: Diagnosis not present

## 2024-11-26 NOTE — Patient Instructions (Addendum)
 It was great to see you today, I will be in touch with your labs asap Flu and pneumonia vaccines today Recommend covid booster and Shingrix series at your convenience Please stop at imaging and set up your lung cancer screening CT  Take care!

## 2024-11-26 NOTE — Progress Notes (Unsigned)
 [facility] [address] [place], [place] [address] [phone]  Date:  11/28/2024   Name:  Scott Phillips   DOB:  20-Aug-1971   MRN:  [id number]  PCP:  Amon Aloysius BRAVO, MD    Chief Complaint: No chief complaint on file.   History of Present Illness:  Scott Phillips is a 53 y.o. very pleasant male patient who presents with the following:  Patient of my partner Dr. Amon who is seen today for physical exam. I have not seen him myself in the past He has been dealing with an infection of the right index finger following a recent trigger finger release surgery  Otherwise he has history of GERD and allergies, OSA Can offer Prevnar 20 Flu shot Lung cancer screening? Shingrix Colonoscopy completed 2020  Discussed the use of AI scribe software for clinical note transcription with the patient, who gave verbal consent to proceed.  History of Present Illness    Patient Active Problem List   Diagnosis Date Noted   MSSA (methicillin susceptible Staphylococcus aureus) infection 10/28/2024   Wound infection after surgery 10/26/2024   Infection of tendon sheath 10/26/2024   Therapeutic drug monitoring 10/26/2024   Post op infection 10/25/2024   Acute low back pain without sciatica 08/05/2024   Acute gout involving toe of right foot 06/21/2024   Hypertrophy of nasal turbinates 11/18/2023   Allergic conjunctivitis of both eyes 04/23/2022   Patellofemoral pain syndrome of right knee 01/16/2021   Perennial allergic rhinitis 11/15/2020   Chronic maxillary sinusitis 11/15/2020   Allergic conjunctivitis 11/15/2020   Annual physical exam 10/14/2020   Serrated polyp of colon 07/13/2020   PCP notes >>>>>>>>>> 07/13/2020   GERD 06/26/2020   OSA (obstructive sleep apnea) 06/26/2020   HPV (human papilloma virus) anogenital infection 04/02/2001    Past Medical History:  Diagnosis Date   Allergic rhinosinusitis    Allergy     Arthritis    Colon polyps    Depression    Diverticulitis    GERD (gastroesophageal reflux disease)    Sinusitis    Sleep apnea     Past Surgical History:  Procedure Laterality Date   CARPAL TUNNEL RELEASE     INCISION AND DRAINAGE OF WOUND Right 10/25/2024   Procedure: IRRIGATION AND DEBRIDEMENT WOUND;  Surgeon: Alyse Agent, MD;  Location: [facility] OR;  Service: Orthopedics;  Laterality: Right;  RIGHT INDEX FINGER IRRIGATION AND DEBRIDEMENT   KNEE ARTHROSCOPY Right 1991   cyst removal   MAXILLARY ANTROSTOMY Right 06/29/2020   Procedure: RIGHT MAXILLARY ANTROSTOMY WITH TISSUE REMOVAL;  Surgeon: Karis Clunes, MD;  Location: [facility];  Service: ENT;  Laterality: Right;   NASAL SINUS SURGERY     x3 in [place]   SUBACROMIAL DECOMPRESSION Left    TRIGGER FINGER RELEASE Right 2018   R 3rd   VASECTOMY  2019    Social History   Tobacco Use   Smoking status: Former    Current packs/day: 0.00    Average packs/day: 2.0 packs/day for 23.0 years (46.0 ttl pk-yrs)    Types: Cigarettes    Start date: 12/30/1991    Quit date: 12/29/2014    Years since quitting: 9.9   Smokeless tobacco: Never  Vaping Use   Vaping status: Never Used  Substance Use Topics   Alcohol use: Yes    Comment: occas   Drug use: Never    Family History  Problem Relation Age of Onset   Cancer Mother    Depression Mother    Hypertension Mother    Miscarriages / Stillbirths Mother    Cancer Father    Alcohol abuse Father    Arthritis Father    Diabetes Father    Hearing loss Father    Allergic rhinitis Sister    Depression Maternal Grandmother    Hearing loss Maternal Grandfather    Asthma Maternal Grandfather    Diabetes Paternal Grandfather    Colon cancer Neg Hx    Prostate cancer Neg Hx    CAD Neg Hx     Allergies  Allergen Reactions   Milk-Related Compounds Other (See Comments)    Sinus congestion     Medication list has been reviewed and updated.  Current Outpatient  Medications on File Prior to Visit  Medication Sig Dispense Refill   acetaminophen  (TYLENOL ) 500 MG tablet Take 2 tablets (1,000 mg total) by mouth 3 (three) times daily.     b complex vitamins tablet Take 1 tablet by mouth daily.     bacitracin  ointment Apply topically 3 (three) times daily. 120 g 0   Calcium-Magnesium-Zinc  (CALCIUM-MAGNESUIUM-ZINC  PO) Take 1 tablet by mouth daily.     cetirizine (ZYRTEC) 10 MG tablet Take 10 mg by mouth daily.     Cholecalciferol (VITAMIN D-3 PO) Take 1 capsule by mouth daily.     fluticasone (FLONASE) 50 MCG/ACT nasal spray Place 1 spray into both nostrils daily.     METAMUCIL FIBER PO Take 1 capsule by mouth See admin instructions. Take 1 capsule 2-4 times daily.     Misc Natural Products (TURMERIC, CURCUMIN, PO) Take 1 tablet by mouth 2 (two) times daily.     Multiple Vitamins-Minerals (MULTIVITAMIN MEN) TABS Take 1 tablet by mouth daily.     mupirocin  ointment (BACTROBAN ) 2 % Apply 1 Application topically 2 (two) times daily. 30 g 1   naproxen sodium (ALEVE) 220 MG tablet Take 220 mg by mouth.     oxyCODONE  (OXY IR/ROXICODONE ) 5 MG immediate release tablet Take 5 mg by mouth every 6 (six) hours as needed for severe pain (pain score 7-10).     pantoprazole  (PROTONIX ) 40 MG tablet Take 1 tablet (40 mg total) by mouth 2 (two) times daily before a meal. 180 tablet 2   Probiotic Product (PROBIOTIC PO) Take 1 capsule by mouth daily.     No current facility-administered medications on file prior to visit.    Review of Systems:  As per HPI- otherwise negative.   Physical Examination: There were no vitals filed for this visit. There were no vitals filed for this visit. There is no height or weight on file to calculate BMI. Ideal Body Weight:    GEN: no acute distress. HEENT: Atraumatic, Normocephalic.  Ears and Nose: No external deformity. CV: RRR, No M/G/R. No JVD. No thrill. No extra heart sounds. PULM: CTA B, no wheezes, crackles, rhonchi. No  retractions. No resp. distress. No accessory muscle use. ABD: S, NT, ND, +BS. No rebound. No HSM. EXTR: No c/c/e PSYCH: Normally interactive. Conversant.    Assessment and Plan: No diagnosis found.  Assessment & Plan   Signed Harlene Schroeder, MD

## 2024-11-28 ENCOUNTER — Ambulatory Visit: Admitting: Family Medicine

## 2024-11-28 VITALS — BP 118/78 | HR 75 | Temp 98.2°F | Ht 68.0 in | Wt 241.2 lb

## 2024-11-28 DIAGNOSIS — K219 Gastro-esophageal reflux disease without esophagitis: Secondary | ICD-10-CM

## 2024-11-28 DIAGNOSIS — Z13220 Encounter for screening for lipoid disorders: Secondary | ICD-10-CM | POA: Diagnosis not present

## 2024-11-28 DIAGNOSIS — Z125 Encounter for screening for malignant neoplasm of prostate: Secondary | ICD-10-CM

## 2024-11-28 DIAGNOSIS — Z0000 Encounter for general adult medical examination without abnormal findings: Secondary | ICD-10-CM

## 2024-11-28 DIAGNOSIS — Z122 Encounter for screening for malignant neoplasm of respiratory organs: Secondary | ICD-10-CM

## 2024-11-28 DIAGNOSIS — Z23 Encounter for immunization: Secondary | ICD-10-CM | POA: Diagnosis not present

## 2024-11-28 MED ORDER — PANTOPRAZOLE SODIUM 40 MG PO TBEC: 40.0000 mg | DELAYED_RELEASE_TABLET | Freq: Two times a day (BID) | ORAL | 3 refills | Status: AC

## 2024-11-29 ENCOUNTER — Encounter: Payer: Self-pay | Admitting: Family Medicine

## 2024-11-29 DIAGNOSIS — M9902 Segmental and somatic dysfunction of thoracic region: Secondary | ICD-10-CM | POA: Diagnosis not present

## 2024-11-29 DIAGNOSIS — M609 Myositis, unspecified: Secondary | ICD-10-CM | POA: Diagnosis not present

## 2024-11-29 DIAGNOSIS — M9903 Segmental and somatic dysfunction of lumbar region: Secondary | ICD-10-CM | POA: Diagnosis not present

## 2024-11-29 DIAGNOSIS — M25641 Stiffness of right hand, not elsewhere classified: Secondary | ICD-10-CM | POA: Diagnosis not present

## 2024-11-29 DIAGNOSIS — M51372 Other intervertebral disc degeneration, lumbosacral region with discogenic back pain and lower extremity pain: Secondary | ICD-10-CM | POA: Diagnosis not present

## 2024-11-29 LAB — LIPID PANEL
Cholesterol: 200 mg/dL (ref 0–200)
HDL: 44.8 mg/dL (ref >39.00)
LDL Cholesterol: 90 mg/dL (ref 0–99)
NonHDL: 154.97
Total CHOL/HDL Ratio: 4
Triglycerides: 324 mg/dL — ABNORMAL HIGH (ref 0.0–149.0)
VLDL: 64.8 mg/dL — ABNORMAL HIGH (ref 0.0–40.0)

## 2024-11-29 LAB — PSA: PSA: 0.64 ng/mL (ref 0.10–4.00)

## 2024-11-30 ENCOUNTER — Encounter (INDEPENDENT_AMBULATORY_CARE_PROVIDER_SITE_OTHER): Payer: Self-pay | Admitting: Otolaryngology

## 2024-11-30 ENCOUNTER — Ambulatory Visit (INDEPENDENT_AMBULATORY_CARE_PROVIDER_SITE_OTHER): Admitting: Otolaryngology

## 2024-11-30 ENCOUNTER — Encounter: Admitting: Medical

## 2024-11-30 VITALS — BP 111/69 | Temp 98.0°F | Ht 68.0 in | Wt 240.0 lb

## 2024-11-30 DIAGNOSIS — J320 Chronic maxillary sinusitis: Secondary | ICD-10-CM

## 2024-11-30 DIAGNOSIS — R0981 Nasal congestion: Secondary | ICD-10-CM | POA: Diagnosis not present

## 2024-11-30 DIAGNOSIS — J310 Chronic rhinitis: Secondary | ICD-10-CM

## 2024-11-30 DIAGNOSIS — J343 Hypertrophy of nasal turbinates: Secondary | ICD-10-CM

## 2024-11-30 DIAGNOSIS — Z8669 Personal history of other diseases of the nervous system and sense organs: Secondary | ICD-10-CM

## 2024-11-30 NOTE — Progress Notes (Signed)
 Patient ID: Scott Phillips, male   DOB: February 11, 1971, 53 y.o.   MRN: [id number]  Follow up: Chronic maxillary sinusitis, fungus ball   HPI: The patient is a 53 year old male who returns today for his follow-up evaluation.  The patient was previously treated for his chronic right fungal maxillary sinusitis.  He underwent a right maxillary antrostomy surgery in July 2021.  At his last visit 6 months ago, his right maxillary antrum was widely patent.  No infection was noted.  He was instructed to continue his steroid nasal spray.  The patient returns today reporting occasional nasal congestion during the fall allergy season.  Currently he denies any facial pain or fever.  He recently had surgical site infection after his right hand surgery.  He was treated with multiple antibiotics.   Exam: General: Communicates without difficulty, well nourished, no acute distress. Head: Normocephalic, no evidence injury, no tenderness, facial buttresses intact without stepoff. Face/sinus: No tenderness to palpation and percussion. Facial movement is normal and symmetric. Eyes: PERRL, EOMI. No scleral icterus, conjunctivae clear. Neuro: CN II exam reveals vision grossly intact.  No nystagmus at any point of gaze. Ears: Auricles well formed without lesions.  Ear canals are intact without mass or lesion.  No erythema or edema is appreciated.  The TMs are intact without fluid. Nose: External evaluation reveals normal support and skin without lesions.  Dorsum is intact.  Anterior rhinoscopy reveals congested mucosa over anterior aspect of inferior turbinates and intact septum.  No purulence noted. Oral:  Oral cavity and oropharynx are intact, symmetric, without erythema or edema.  Mucosa is moist without lesions. Neck: Full range of motion without pain.  There is no significant lymphadenopathy.  No masses palpable.  Thyroid bed within normal limits to palpation.  Parotid glands and submandibular glands equal bilaterally without  mass.  Trachea is midline. Neuro:  CN 2-12 grossly intact.     Assessment: 1.  Chronic rhinitis with diffuse nasal mucosal congestion. 2.  History of chronic right fungal maxillary sinusitis.  No infection is noted today.   Plan: 1.  The physical exam findings are reviewed with the patient.  2.  Continue with nasal saline irrigation and Flonase nasal spray.  3.  The patient will return for re-evaluation in 6 months, sooner if needed.

## 2024-12-01 DIAGNOSIS — M25641 Stiffness of right hand, not elsewhere classified: Secondary | ICD-10-CM | POA: Diagnosis not present

## 2024-12-05 ENCOUNTER — Ambulatory Visit (HOSPITAL_BASED_OUTPATIENT_CLINIC_OR_DEPARTMENT_OTHER): Admission: RE | Admit: 2024-12-05 | Discharge: 2024-12-05 | Attending: Family Medicine | Admitting: Family Medicine

## 2024-12-05 DIAGNOSIS — Z87891 Personal history of nicotine dependence: Secondary | ICD-10-CM | POA: Diagnosis not present

## 2024-12-05 DIAGNOSIS — Z122 Encounter for screening for malignant neoplasm of respiratory organs: Secondary | ICD-10-CM

## 2024-12-06 DIAGNOSIS — M25641 Stiffness of right hand, not elsewhere classified: Secondary | ICD-10-CM | POA: Diagnosis not present

## 2024-12-06 DIAGNOSIS — M9902 Segmental and somatic dysfunction of thoracic region: Secondary | ICD-10-CM | POA: Diagnosis not present

## 2024-12-06 DIAGNOSIS — M609 Myositis, unspecified: Secondary | ICD-10-CM | POA: Diagnosis not present

## 2024-12-06 DIAGNOSIS — M51372 Other intervertebral disc degeneration, lumbosacral region with discogenic back pain and lower extremity pain: Secondary | ICD-10-CM | POA: Diagnosis not present

## 2024-12-06 DIAGNOSIS — M9903 Segmental and somatic dysfunction of lumbar region: Secondary | ICD-10-CM | POA: Diagnosis not present

## 2024-12-09 ENCOUNTER — Encounter: Payer: Self-pay | Admitting: Family Medicine

## 2024-12-09 DIAGNOSIS — M25641 Stiffness of right hand, not elsewhere classified: Secondary | ICD-10-CM | POA: Diagnosis not present

## 2024-12-13 DIAGNOSIS — M25641 Stiffness of right hand, not elsewhere classified: Secondary | ICD-10-CM | POA: Diagnosis not present

## 2024-12-14 DIAGNOSIS — M9903 Segmental and somatic dysfunction of lumbar region: Secondary | ICD-10-CM | POA: Diagnosis not present

## 2024-12-14 DIAGNOSIS — M51372 Other intervertebral disc degeneration, lumbosacral region with discogenic back pain and lower extremity pain: Secondary | ICD-10-CM | POA: Diagnosis not present

## 2024-12-14 DIAGNOSIS — M609 Myositis, unspecified: Secondary | ICD-10-CM | POA: Diagnosis not present

## 2024-12-14 DIAGNOSIS — M9902 Segmental and somatic dysfunction of thoracic region: Secondary | ICD-10-CM | POA: Diagnosis not present

## 2024-12-15 ENCOUNTER — Encounter: Payer: Self-pay | Admitting: Family Medicine

## 2024-12-16 DIAGNOSIS — M25641 Stiffness of right hand, not elsewhere classified: Secondary | ICD-10-CM | POA: Diagnosis not present

## 2024-12-16 MED ORDER — COLCHICINE 0.6 MG PO TABS: 0.6000 mg | ORAL_TABLET | Freq: Every day | ORAL | 0 refills | Status: AC | PRN

## 2024-12-20 DIAGNOSIS — M9903 Segmental and somatic dysfunction of lumbar region: Secondary | ICD-10-CM | POA: Diagnosis not present

## 2024-12-20 DIAGNOSIS — M609 Myositis, unspecified: Secondary | ICD-10-CM | POA: Diagnosis not present

## 2024-12-20 DIAGNOSIS — M9902 Segmental and somatic dysfunction of thoracic region: Secondary | ICD-10-CM | POA: Diagnosis not present

## 2024-12-20 DIAGNOSIS — M25641 Stiffness of right hand, not elsewhere classified: Secondary | ICD-10-CM | POA: Diagnosis not present

## 2024-12-26 DIAGNOSIS — M25641 Stiffness of right hand, not elsewhere classified: Secondary | ICD-10-CM | POA: Diagnosis not present

## 2025-01-12 ENCOUNTER — Ambulatory Visit: Admitting: Pulmonary Disease

## 2025-01-12 ENCOUNTER — Encounter: Payer: Self-pay | Admitting: Pulmonary Disease

## 2025-01-12 VITALS — BP 118/81 | HR 69 | Temp 97.3°F | Ht 68.0 in | Wt 242.0 lb

## 2025-01-12 DIAGNOSIS — Z87891 Personal history of nicotine dependence: Secondary | ICD-10-CM

## 2025-01-12 DIAGNOSIS — G4733 Obstructive sleep apnea (adult) (pediatric): Secondary | ICD-10-CM | POA: Diagnosis not present

## 2025-01-12 DIAGNOSIS — E66812 Obesity, class 2: Secondary | ICD-10-CM | POA: Diagnosis not present

## 2025-01-12 NOTE — Progress Notes (Signed)
 "              Scott Phillips    [id number]    1971-03-26  Primary Care Physician:Paz, Aloysius BRAVO, MD  Referring Physician: Amon Aloysius BRAVO, MD [address] [place],  [place] [address]  Chief complaint:   Patient with obstructive sleep apnea Stable symptoms  HPI: Continues to use CPAP on a nightly basis  Was last seen a year ago and was not having any problems  Wakes up rested Gets about 5 to 6 hours of sleep  No significant changes in his health  Not having any significant daytime symptoms   Outpatient Encounter Medications as of 01/12/2025  Medication Sig   b complex vitamins tablet Take 1 tablet by mouth daily.   Calcium-Magnesium-Zinc  (CALCIUM-MAGNESUIUM-ZINC  PO) Take 1 tablet by mouth daily.   cetirizine (ZYRTEC) 10 MG tablet Take 10 mg by mouth daily.   Cholecalciferol (VITAMIN D-3 PO) Take 1 capsule by mouth daily.   colchicine  0.6 MG tablet Take 1 tablet (0.6 mg total) by mouth daily as needed. On first day of flare: take 2 tablets, then take 1 tablet an hour later. Continue taking 1 tablet twice daily until pain resolves.   fluticasone (FLONASE) 50 MCG/ACT nasal spray Place 1 spray into both nostrils daily.   ketoconazole (NIZORAL) 2 % cream APPLY TOPICALLY TO THE AFFECTED AREA TWICE DAILY   METAMUCIL FIBER PO Take 1 capsule by mouth See admin instructions. Take 1 capsule 2-4 times daily.   Misc Natural Products (TURMERIC, CURCUMIN, PO) Take 1 tablet by mouth 2 (two) times daily.   Multiple Vitamins-Minerals (MULTIVITAMIN MEN) TABS Take 1 tablet by mouth daily.   naproxen sodium (ALEVE) 220 MG tablet Take 220 mg by mouth.   pantoprazole  (PROTONIX ) 40 MG tablet Take 1 tablet (40 mg total) by mouth 2 (two) times daily before a meal.   Probiotic Product (PROBIOTIC PO) Take 1 capsule by mouth daily.   [DISCONTINUED] mupirocin  ointment (BACTROBAN ) 2 % Apply 1 Application topically 2 (two) times daily.   No facility-administered encounter medications on file as of  01/12/2025.    Allergies as of 01/12/2025 - Review Complete 01/12/2025  Allergen Reaction Noted   Milk (cow) Other (See Comments) 06/29/73   Milk-related compounds Other (See Comments) 06/29/2020    Past Medical History:  Diagnosis Date   Allergic rhinosinusitis    Allergy    Arthritis    Colon polyps    Depression    Diverticulitis    GERD (gastroesophageal reflux disease)    Sinusitis    Sleep apnea     Past Surgical History:  Procedure Laterality Date   CARPAL TUNNEL RELEASE     INCISION AND DRAINAGE OF WOUND Right 10/25/2024   Procedure: IRRIGATION AND DEBRIDEMENT WOUND;  Surgeon: Alyse Agent, MD;  Location: [facility] OR;  Service: Orthopedics;  Laterality: Right;  RIGHT INDEX FINGER IRRIGATION AND DEBRIDEMENT   KNEE ARTHROSCOPY Right 1991   cyst removal   MAXILLARY ANTROSTOMY Right 06/29/2020   Procedure: RIGHT MAXILLARY ANTROSTOMY WITH TISSUE REMOVAL;  Surgeon: Karis Clunes, MD;  Location: [facility];  Service: ENT;  Laterality: Right;   NASAL SINUS SURGERY     x3 in [place]   SUBACROMIAL DECOMPRESSION Left    TRIGGER FINGER RELEASE Right 2018   R 3rd   VASECTOMY  2019    Family History  Problem Relation Age of Onset   Cancer Mother    Depression Mother  Hypertension Mother    Miscarriages / Stillbirths Mother    Cancer Father    Alcohol abuse Father    Arthritis Father    Diabetes Father    Hearing loss Father    Allergic rhinitis Sister    Depression Maternal Grandmother    Hearing loss Maternal Grandfather    Asthma Maternal Grandfather    Diabetes Paternal Grandfather    Colon cancer Neg Hx    Prostate cancer Neg Hx    CAD Neg Hx     Social History   Socioeconomic History   Marital status: Married    Spouse name: Not on file   Number of children: 1   Years of education: Not on file   Highest education level: Bachelor's degree (e.g., BA, AB, BS)  Occupational History   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  Tobacco  Use   Smoking status: Former    Current packs/day: 0.00    Average packs/day: 2.0 packs/day for 23.0 years (46.0 ttl pk-yrs)    Types: Cigarettes    Start date: 12/30/1991    Quit date: 12/29/2014    Years since quitting: 10.0   Smokeless tobacco: Never  Vaping Use   Vaping status: Never Used  Substance and Sexual Activity   Alcohol use: Yes    Comment: occas   Drug use: Never   Sexual activity: Not on file  Other Topics Concern   Not on file  Social History Narrative   Household: pt, wife and Daughter born  2018   Social Drivers of Health   Tobacco Use: Medium Risk (01/12/2025)   Patient History    Smoking Tobacco Use: Former    Smokeless Tobacco Use: Never    Passive Exposure: Not on Actuary Strain: Low Risk (11/23/2024)   Overall Financial Resource Strain (CARDIA)    Difficulty of Paying Living Expenses: Not hard at all  Food Insecurity: No Food Insecurity (11/23/2024)   Epic    Worried About Radiation Protection Practitioner of Food in the Last Year: Never true    Ran Out of Food in the Last Year: Never true  Transportation Needs: No Transportation Needs (11/23/2024)   Epic    Lack of Transportation (Medical): No    Lack of Transportation (Non-Medical): No  Physical Activity: Insufficiently Active (11/23/2024)   Exercise Vital Sign    Days of Exercise per Week: 1 day    Minutes of Exercise per Session: 20 min  Stress: Stress Concern Present (11/23/2024)   [employer] of Occupational Health - Occupational Stress Questionnaire    Feeling of Stress: Very much  Social Connections: Moderately Integrated (11/23/2024)   Social Connection and Isolation Panel    Frequency of Communication with Friends and Family: Three times a week    Frequency of Social Gatherings with Friends and Family: Twice a week    Attends Religious Services: 1 to 4 times per year    Active Member of [employer] or Organizations: No    Attends Banker Meetings: Not on file    Marital Status:  Married  Intimate Partner Violence: Not At Risk (10/26/2024)   Epic    Fear of Current or Ex-Partner: No    Emotionally Abused: No    Physically Abused: No    Sexually Abused: No  Depression (PHQ2-9): High Risk (11/28/2024)   Depression (PHQ2-9)    PHQ-2 Score: 17  Alcohol Screen: Low Risk (11/23/2024)   Alcohol Screen    Last Alcohol Screening  Score (AUDIT): 4  Housing: Low Risk (11/23/2024)   Epic    Unable to Pay for Housing in the Last Year: No    Number of Times Moved in the Last Year: 0    Homeless in the Last Year: No  Utilities: Not At Risk (10/26/2024)   Epic    Threatened with loss of utilities: No  Health Literacy: Not on file    Review of Systems  Respiratory:  Positive for apnea.   Psychiatric/Behavioral:  Positive for sleep disturbance.   All other systems reviewed and are negative.   Vitals:   01/12/25 1528  BP: 118/81  Pulse: 69  Temp: (!) 97.3 F (36.3 C)  SpO2: 99%     Physical Exam Constitutional:      Appearance: He is obese.  HENT:     Head: Normocephalic.     Mouth/Throat:     Mouth: Mucous membranes are moist.  Eyes:     General:        Right eye: No discharge.        Left eye: No discharge.  Cardiovascular:     Rate and Rhythm: Normal rate and regular rhythm.     Heart sounds:     No friction rub.  Pulmonary:     Effort: Pulmonary effort is normal. No respiratory distress.     Breath sounds: No stridor. No wheezing or rhonchi.  Musculoskeletal:     Cervical back: No rigidity or tenderness.  Neurological:     Mental Status: He is alert.  Psychiatric:        Mood and Affect: Mood normal.    Data Reviewed: [place]  We did not have his compliance data today but previous compliance from last visit showed 100% compliance AutoSet 5-16 Residual AHI of 0.8  His compliance last year was also excellent with a residual AHI of 0.1   Assessment:  Obstructive sleep apnea - 100% compliant  Continues to  benefit from CPAP use  Class II obesity - Continue weight loss efforts    Plan/Recommendations:  No changes to his CPAP Continue auto CPAP 6-15  Yearly follow-up  Can you call with concerns  Continues to benefit from CPAP use  Jennet Epley MD [facility] 01/12/2025, 3:56 PM  CC: Amon Aloysius BRAVO, MD     "

## 2025-01-12 NOTE — Patient Instructions (Signed)
 I will see you a year from now  We will send an order to the medical supply company for CPAP supplies  Call us  with significant concerns Contact us  with any concerns  Continue using your CPAP

## 2025-01-17 ENCOUNTER — Encounter: Payer: Self-pay | Admitting: Internal Medicine

## 2025-01-18 ENCOUNTER — Telehealth: Payer: Self-pay

## 2025-01-18 MED ORDER — GENTAMICIN SULFATE 0.3 % OP SOLN: 2.0000 [drp] | Freq: Three times a day (TID) | OPHTHALMIC | 0 refills | Status: AC

## 2025-01-18 MED ORDER — GENTAMICIN SULFATE 0.3 % OP OINT: TOPICAL_OINTMENT | Freq: Two times a day (BID) | OPHTHALMIC | 0 refills | Status: DC

## 2025-01-18 NOTE — Telephone Encounter (Signed)
 sent

## 2025-01-18 NOTE — Telephone Encounter (Signed)
 Received fax from [facility]- gentamicin  ointment unavailable. They are able to get the eye drops. Please advise?

## 2025-01-25 ENCOUNTER — Encounter: Admitting: Internal Medicine

## 2025-05-31 ENCOUNTER — Ambulatory Visit (INDEPENDENT_AMBULATORY_CARE_PROVIDER_SITE_OTHER): Admitting: Otolaryngology
